# Patient Record
Sex: Female | Born: 1970 | Race: Black or African American | Hispanic: No | Marital: Single | Smoking: Never smoker
Health system: Southern US, Community
[De-identification: ages and names within clinical notes are randomized; demographics above are authoritative.]

## PROBLEM LIST (undated history)

## (undated) DIAGNOSIS — N649 Disorder of breast, unspecified: Secondary | ICD-10-CM

## (undated) DIAGNOSIS — I10 Essential (primary) hypertension: Secondary | ICD-10-CM

## (undated) DIAGNOSIS — E663 Overweight: Secondary | ICD-10-CM

## (undated) DIAGNOSIS — K219 Gastro-esophageal reflux disease without esophagitis: Secondary | ICD-10-CM

## (undated) DIAGNOSIS — T7840XA Allergy, unspecified, initial encounter: Secondary | ICD-10-CM

## (undated) DIAGNOSIS — J45909 Unspecified asthma, uncomplicated: Secondary | ICD-10-CM

## (undated) HISTORY — PX: TOTAL ABDOMINAL HYSTERECTOMY: SHX209

## (undated) HISTORY — DX: Unspecified asthma, uncomplicated: J45.909

## (undated) HISTORY — DX: Gastro-esophageal reflux disease without esophagitis: K21.9

## (undated) HISTORY — DX: Disorder of breast, unspecified: N64.9

## (undated) HISTORY — PX: INNER EAR SURGERY: SHX679

## (undated) HISTORY — PX: FRACTURE SURGERY: SHX138

## (undated) HISTORY — PX: ELBOW FRACTURE SURGERY: SHX616

## (undated) HISTORY — DX: Allergy, unspecified, initial encounter: T78.40XA

## (undated) HISTORY — DX: Overweight: E66.3

## (undated) HISTORY — DX: Essential (primary) hypertension: I10

---

## 1998-11-12 ENCOUNTER — Ambulatory Visit (HOSPITAL_COMMUNITY): Admission: RE | Admit: 1998-11-12 | Discharge: 1998-11-12 | Payer: Self-pay | Admitting: Internal Medicine

## 1998-11-12 ENCOUNTER — Encounter: Payer: Self-pay | Admitting: Internal Medicine

## 1999-01-20 ENCOUNTER — Inpatient Hospital Stay (HOSPITAL_COMMUNITY): Admission: AD | Admit: 1999-01-20 | Discharge: 1999-01-20 | Payer: Self-pay | Admitting: Obstetrics and Gynecology

## 1999-04-07 ENCOUNTER — Other Ambulatory Visit: Admission: RE | Admit: 1999-04-07 | Discharge: 1999-04-07 | Payer: Self-pay | Admitting: Obstetrics & Gynecology

## 1999-04-09 ENCOUNTER — Encounter: Admission: RE | Admit: 1999-04-09 | Discharge: 1999-04-23 | Payer: Self-pay | Admitting: Internal Medicine

## 1999-12-16 ENCOUNTER — Ambulatory Visit: Admission: RE | Admit: 1999-12-16 | Discharge: 1999-12-16 | Payer: Self-pay | Admitting: Internal Medicine

## 2000-04-07 ENCOUNTER — Other Ambulatory Visit: Admission: RE | Admit: 2000-04-07 | Discharge: 2000-04-07 | Payer: Self-pay | Admitting: Obstetrics and Gynecology

## 2001-03-03 ENCOUNTER — Ambulatory Visit (HOSPITAL_BASED_OUTPATIENT_CLINIC_OR_DEPARTMENT_OTHER): Admission: RE | Admit: 2001-03-03 | Discharge: 2001-03-03 | Payer: Self-pay | Admitting: Otolaryngology

## 2001-09-28 ENCOUNTER — Other Ambulatory Visit: Admission: RE | Admit: 2001-09-28 | Discharge: 2001-09-28 | Payer: Self-pay | Admitting: Obstetrics and Gynecology

## 2002-10-16 ENCOUNTER — Other Ambulatory Visit: Admission: RE | Admit: 2002-10-16 | Discharge: 2002-10-16 | Payer: Self-pay | Admitting: Obstetrics and Gynecology

## 2003-10-14 ENCOUNTER — Emergency Department (HOSPITAL_COMMUNITY): Admission: EM | Admit: 2003-10-14 | Discharge: 2003-10-14 | Payer: Self-pay | Admitting: Family Medicine

## 2004-01-07 ENCOUNTER — Other Ambulatory Visit: Admission: RE | Admit: 2004-01-07 | Discharge: 2004-01-07 | Payer: Self-pay | Admitting: Obstetrics and Gynecology

## 2004-02-14 ENCOUNTER — Ambulatory Visit: Payer: Self-pay | Admitting: Internal Medicine

## 2004-09-28 ENCOUNTER — Other Ambulatory Visit: Admission: RE | Admit: 2004-09-28 | Discharge: 2004-09-28 | Payer: Self-pay | Admitting: Obstetrics and Gynecology

## 2005-02-16 ENCOUNTER — Inpatient Hospital Stay (HOSPITAL_COMMUNITY): Admission: AD | Admit: 2005-02-16 | Discharge: 2005-02-17 | Payer: Self-pay | Admitting: Obstetrics and Gynecology

## 2005-04-26 ENCOUNTER — Ambulatory Visit: Payer: Self-pay | Admitting: Internal Medicine

## 2005-11-03 ENCOUNTER — Encounter: Admission: RE | Admit: 2005-11-03 | Discharge: 2005-11-03 | Payer: Self-pay | Admitting: Orthopedic Surgery

## 2005-11-11 ENCOUNTER — Ambulatory Visit (HOSPITAL_COMMUNITY): Admission: RE | Admit: 2005-11-11 | Discharge: 2005-11-12 | Payer: Self-pay | Admitting: Orthopedic Surgery

## 2006-02-16 ENCOUNTER — Encounter: Admission: RE | Admit: 2006-02-16 | Discharge: 2006-03-09 | Payer: Self-pay | Admitting: Internal Medicine

## 2007-02-24 ENCOUNTER — Ambulatory Visit: Payer: Self-pay | Admitting: Internal Medicine

## 2007-07-20 ENCOUNTER — Encounter: Admission: RE | Admit: 2007-07-20 | Discharge: 2007-07-20 | Payer: Self-pay | Admitting: Obstetrics and Gynecology

## 2008-09-16 ENCOUNTER — Encounter: Payer: Self-pay | Admitting: Internal Medicine

## 2008-10-23 ENCOUNTER — Telehealth: Payer: Self-pay | Admitting: Internal Medicine

## 2009-01-15 ENCOUNTER — Ambulatory Visit: Payer: Self-pay | Admitting: Family Medicine

## 2009-01-18 ENCOUNTER — Telehealth: Payer: Self-pay | Admitting: Family Medicine

## 2009-01-29 ENCOUNTER — Encounter: Payer: Self-pay | Admitting: Internal Medicine

## 2009-01-29 DIAGNOSIS — J45909 Unspecified asthma, uncomplicated: Secondary | ICD-10-CM

## 2009-01-29 HISTORY — DX: Unspecified asthma, uncomplicated: J45.909

## 2009-02-04 ENCOUNTER — Encounter: Payer: Self-pay | Admitting: Internal Medicine

## 2009-02-04 ENCOUNTER — Ambulatory Visit: Admission: RE | Admit: 2009-02-04 | Discharge: 2009-02-04 | Payer: Self-pay | Admitting: Internal Medicine

## 2009-02-27 ENCOUNTER — Ambulatory Visit: Payer: Self-pay | Admitting: Family Medicine

## 2009-02-27 ENCOUNTER — Encounter: Payer: Self-pay | Admitting: Internal Medicine

## 2009-02-28 LAB — CONVERTED CEMR LAB
ALT: 15 units/L (ref 0–35)
AST: 22 units/L (ref 0–37)
BUN: 5 mg/dL — ABNORMAL LOW (ref 6–23)
Basophils Relative: 0.9 % (ref 0.0–3.0)
CO2: 26 meq/L (ref 19–32)
Eosinophils Relative: 1.3 % (ref 0.0–5.0)
GFR calc non Af Amer: 90.79 mL/min (ref >60)
Glucose, Bld: 79 mg/dL (ref 70–99)
HCT: 40.9 % (ref 36.0–46.0)
HDL: 56 mg/dL (ref >39.00)
Hemoglobin: 13.6 g/dL (ref 12.0–15.0)
Lymphs Abs: 2.2 10*3/uL (ref 0.7–4.0)
MCV: 92.4 fL (ref 78.0–100.0)
Monocytes Absolute: 0.5 10*3/uL (ref 0.1–1.0)
Monocytes Relative: 8.4 % (ref 3.0–12.0)
Neutro Abs: 3.6 10*3/uL (ref 1.4–7.7)
Platelets: 283 10*3/uL (ref 150.0–400.0)
RBC: 4.43 M/uL (ref 3.87–5.11)
TSH: 0.98 microintl units/mL (ref 0.35–5.50)
Total Bilirubin: 1.1 mg/dL (ref 0.3–1.2)
Total Protein: 7.1 g/dL (ref 6.0–8.3)
Triglycerides: 59 mg/dL (ref 0.0–149.0)
WBC: 6.5 10*3/uL (ref 4.5–10.5)

## 2009-06-30 ENCOUNTER — Telehealth: Payer: Self-pay | Admitting: Internal Medicine

## 2009-07-02 ENCOUNTER — Ambulatory Visit: Payer: Self-pay | Admitting: Family Medicine

## 2009-07-02 LAB — CONVERTED CEMR LAB
Beta hcg, urine, semiquantitative: NEGATIVE
Glucose, Urine, Semiquant: NEGATIVE
Nitrite: NEGATIVE
Specific Gravity, Urine: 1.015
WBC Urine, dipstick: NEGATIVE

## 2009-08-01 ENCOUNTER — Telehealth: Payer: Self-pay | Admitting: Family Medicine

## 2009-09-29 ENCOUNTER — Ambulatory Visit: Payer: Self-pay | Admitting: Internal Medicine

## 2009-09-29 LAB — CONVERTED CEMR LAB
ALT: 14 units/L (ref 0–35)
AST: 18 units/L (ref 0–37)
Albumin: 3.5 g/dL (ref 3.5–5.2)
Alkaline Phosphatase: 51 units/L (ref 39–117)
Basophils Relative: 0.8 % (ref 0.0–3.0)
Calcium: 9 mg/dL (ref 8.4–10.5)
Eosinophils Relative: 5.4 % — ABNORMAL HIGH (ref 0.0–5.0)
GFR calc non Af Amer: 94.11 mL/min (ref >60)
HCT: 40.2 % (ref 36.0–46.0)
HDL: 46.4 mg/dL (ref >39.00)
Hemoglobin: 13.9 g/dL (ref 12.0–15.0)
Lymphocytes Relative: 44.2 % (ref 12.0–46.0)
Monocytes Relative: 8 % (ref 3.0–12.0)
Neutro Abs: 2.2 10*3/uL (ref 1.4–7.7)
Potassium: 4.1 meq/L (ref 3.5–5.1)
RBC: 4.43 M/uL (ref 3.87–5.11)
Sodium: 141 meq/L (ref 135–145)
Total CHOL/HDL Ratio: 3
Total Protein: 6.8 g/dL (ref 6.0–8.3)
VLDL: 9.8 mg/dL (ref 0.0–40.0)

## 2009-10-02 ENCOUNTER — Ambulatory Visit: Payer: Self-pay | Admitting: Internal Medicine

## 2009-10-02 DIAGNOSIS — R002 Palpitations: Secondary | ICD-10-CM | POA: Insufficient documentation

## 2009-10-07 ENCOUNTER — Telehealth: Payer: Self-pay | Admitting: *Deleted

## 2009-11-14 ENCOUNTER — Telehealth (INDEPENDENT_AMBULATORY_CARE_PROVIDER_SITE_OTHER): Payer: Self-pay | Admitting: *Deleted

## 2009-12-25 ENCOUNTER — Telehealth: Payer: Self-pay | Admitting: Internal Medicine

## 2009-12-30 ENCOUNTER — Inpatient Hospital Stay (HOSPITAL_COMMUNITY): Admission: RE | Admit: 2009-12-30 | Discharge: 2010-01-01 | Payer: Self-pay | Admitting: Obstetrics and Gynecology

## 2009-12-30 ENCOUNTER — Encounter (INDEPENDENT_AMBULATORY_CARE_PROVIDER_SITE_OTHER): Payer: Self-pay | Admitting: Obstetrics and Gynecology

## 2010-03-08 LAB — CONVERTED CEMR LAB
CK-MB: 1 ng/mL
Total CK: 95 U/L

## 2010-03-12 NOTE — Progress Notes (Signed)
Summary: call for meds  Phone Note Call from Patient   Caller: Patient Summary of Call: pt recently completed Levaquin course for URI and now having yeast sxs.  would like Diflucan called in. Initial call taken by: Neena Rhymes MD,  August 01, 2009 9:58 AM    New/Updated Medications: DIFLUCAN 150 MG TABS (FLUCONAZOLE) once daily.  may repeat in 3 days if sxs persist Prescriptions: DIFLUCAN 150 MG TABS (FLUCONAZOLE) once daily.  may repeat in 3 days if sxs persist  #2 x 0   Entered and Authorized by:   Neena Rhymes MD   Signed by:   Neena Rhymes MD on 08/01/2009   Method used:   Electronically to        [facility]* (retail)       [address]       [address] Shipping/mailing       [place], [place]  [address]       Ph: [phone]       Fax: [phone]   RxID:   [id number]

## 2010-03-12 NOTE — Progress Notes (Signed)
Summary: UTI  Phone Note Call from Patient Call back at [phone]   Caller: Patient Summary of Call: Has UTI, would like to have a rx called in to [facility] on [address]. Initial call taken by: Trixie Dredge,  Jun 30, 2009 8:43 AM  Follow-up for Phone Call        Cipro 250 two times a day for 3 days Follow-up by: Birdie Sons MD,  Jun 30, 2009 12:40 PM    New/Updated Medications: CIPRO 250 MG TABS (CIPROFLOXACIN HCL) one by mouth two times a day x 3 days Prescriptions: CIPRO 250 MG TABS (CIPROFLOXACIN HCL) one by mouth two times a day x 3 days  #6 x 0   Entered by:   Lynann Beaver CMA   Authorized by:   Birdie Sons MD   Signed by:   Lynann Beaver CMA on 06/30/2009   Method used:   Electronically to        [facility]  [phone]* (retail)       [address]       [place], [place]  [address]       Ph: [phone] or [phone]       Fax: [phone]   RxID:   [id number]

## 2010-03-12 NOTE — Assessment & Plan Note (Signed)
Summary: ACUTE/CDJ   Vital Signs:  Patient profile:   40 year old female Weight:      200 pounds Temp:     98.4 degrees F oral BP sitting:   130 / 80  (left arm)  Vitals Entered By: Doristine Devoid (Jul 02, 2009 3:16 PM) CC: sx started mon. vomiting and nausea some fever and back pain along w/ sinus congestion and sore throat   History of Present Illness: 40 yo woman here today for back pain, N/V.  sxs started abruptly Monday at 3 AM w/ back pain, urinary frequency.  had dry heaves in the shower.  + fever, HA.  HA has resolved.  back pain is lumbar- has been doing boot camp and zumba classes.  back pain improves w/ heat and massage.  + nasal congestion- thick, blood tinged.  no facial pain.  + sore throat, somewhat improved w/ ibuprofen.  no hx of kidney stones.  Allergies (verified): No Known Drug Allergies  Review of Systems      See HPI  Physical Exam  General:  obviously uncomfortable Head:  no TTP over frontal or maxillary sinuses Eyes:  some injxn or inflammation Ears:  External ear exam shows no significant lesions or deformities.  Otoscopic examination reveals clear canals, tympanic membranes are intact bilaterally without bulging, retraction, inflammation or discharge. Hearing is grossly normal bilaterally. Nose:  + congestion Mouth:  + posterior pharyngeal erythema.  no tonsilar edema/exudates Neck:  some LAD Lungs:  Normal respiratory effort, chest expands symmetrically. Lungs are clear to auscultation, no crackles or wheezes. Heart:  Normal rate and regular rhythm. S1 and S2 normal without gallop, murmur, click, rub or other extra sounds. Abdomen:  soft, NT/ND, no suprapubic or CVA tenderness.  + palpable fibroid Msk:  + lumbar pain w/ palpation, good flexion and extension, (-) SLR Pulses:  +2 DP/PT Extremities:  no C/C/E Neurologic:  alert & oriented X3, cranial nerves II-XII intact, strength normal in all extremities, sensation intact to light touch, gait normal, and  DTRs symmetrical and normal.     Impression & Recommendations:  Problem # 1:  URI (ICD-465.9) Assessment New bloody sinuses possibly due to infxn but lack of tenderness is unusual.  + sore throat.  no ear pain, cough.  start Levaquin for likely sinusitis- would also cover possible bronchitis/UTI.  reviewed supportive care and red flags that should prompt return. Pt expresses understanding and is in agreement w/ this plan.  Problem # 2:  UTI (ICD-599.0) Assessment: Unchanged UA suspicious for infxn.  start Levaquin and send for cx. The following medications were removed from the medication list:    Cipro 250 Mg Tabs (Ciprofloxacin hcl) ..... One by mouth two times a day x 3 days Her updated medication list for this problem includes:    Levaquin 500 Mg Tabs (Levofloxacin) .Marland Kitchen... 1 tab by mouth daily x10 days  Orders: T-Culture, Urine ([id number])  Problem # 3:  BACK PAIN (ICD-724.5) Assessment: New ? relation to current illness vs lumbar strain from boot camp or zumba.  start ibuprofen alternating w/ vicodin.  reviewed supportive care and red flags that should prompt return.  Pt expresses understanding and is in agreement w/ this plan. Her updated medication list for this problem includes:    Vicodin 5-500 Mg Tabs (Hydrocodone-acetaminophen) .Marland Kitchen... 1-2 tabs by mouth q6 as needed for severe pain  Orders: T-Culture, Urine ([id number])  Complete Medication List: 1)  Loestrin 24 Fe 1-20 Mg-mcg Tabs (Norethin ace-eth estrad-fe) 2)  Adipex-p  37.5 Mg Caps (Phentermine hcl) .Marland Kitchen.. 1 by mouth once daily 3)  Valacyclovir Hcl 500 Mg Tabs (Valacyclovir hcl) .Marland Kitchen.. 1 three times a day 4)  Zovirax 5 % Oint (Acyclovir) .... Marland Kitchenapply 5 times a day as directed 5)  Levaquin 500 Mg Tabs (Levofloxacin) .Marland Kitchen.. 1 tab by mouth daily x10 days 6)  Vicodin 5-500 Mg Tabs (Hydrocodone-acetaminophen) .Marland Kitchen.. 1-2 tabs by mouth q6 as needed for severe pain  Patient Instructions: 1)  We will wait the results of your urine  culture 2)  In the mean time, start the Levaquin as directed- this will cover sinus infection, pneumonia/bronchitis (although you don't seem to have this), and UTI 3)  Take the Vicodin as needed for your back pain 4)  Ibuprofen 800mg  every 8 hrs as needed for pain 5)  Use a heating pad for pain relief 6)  If you back pain worsens, you have increased nausea/vomiting, or other concerns- call or go to UC/ER 7)  We may need to get a CT scan depending on your urine culture and back pain 8)  Hang in there! Prescriptions: VICODIN 5-500 MG TABS (HYDROCODONE-ACETAMINOPHEN) 1-2 tabs by mouth Q6 as needed for severe pain  #30 x 0   Entered and Authorized by:   Neena Rhymes MD   Signed by:   Neena Rhymes MD on 07/02/2009   Method used:   Printed then faxed to ...       [facility]* (retail)       [address]       [address] Shipping/mailing       [place], [place]  [address]       Ph: [phone]       Fax: [phone]   RxID:   [id number] LEVAQUIN 500 MG TABS (LEVOFLOXACIN) 1 tab by mouth daily x10 days  #10 x 0   Entered and Authorized by:   Neena Rhymes MD   Signed by:   Neena Rhymes MD on 07/02/2009   Method used:   Printed then faxed to ...       [facility]* (retail)       [address]       [address] Shipping/mail[address]       [place], [place]  [address]       Ph: [phone]       Fax: [phone]   RxID:   [id number]   Laboratory Results   Urine Tests    Routine Urinalysis   Glucose: negative   (Normal Range: Negative) Bilirubin: negative   (Normal Range: Negative) Ketone: negative   (Normal Range: Negative) Spec. Gravity: 1.015   (Normal Range: 1.003-1.035) Blood: small   (Normal Range: Negative) pH: 6.0   (Normal Range: 5.0-8.0) Protein: negative   (Normal Range: Negative) Urobilinogen: 0.2   (Normal Range: 0-1) Nitrite: negative   (Normal Range: Negative) Leukocyte Esterace: negative   (Normal  Range: Negative)    Urine HCG: negative   Other Tests  Rapid Strep: negative

## 2010-03-12 NOTE — Progress Notes (Signed)
Summary: new rx  Phone Note Refill Request   Refills Requested: Medication #1:  ADDERALL XR 20 MG XR24H-CAP one by mouth daily. please call pt when ready for pick up ,pt is requesting med tomorrow please.  Initial call taken by: Drue Stager,  December 25, 2009 5:24 PM  Follow-up for Phone Call        rx up front ready for p/u, pt aware Follow-up by: Alfred Levins, CMA,  December 26, 2009 9:14 AM    Prescriptions: ADDERALL XR 20 MG XR24H-CAP (AMPHETAMINE-DEXTROAMPHETAMINE) one by mouth daily  #30 x 0   Entered by:   Alfred Levins, CMA   Authorized by:   Birdie Sons MD   Signed by:   Alfred Levins, CMA on 12/26/2009   Method used:   Print then Give to Patient   RxID:   [id number] ADDERALL XR 20 MG XR24H-CAP (AMPHETAMINE-DEXTROAMPHETAMINE) one by mouth daily  #30 x 0   Entered by:   Alfred Levins, CMA   Authorized by:   Birdie Sons MD   Signed by:   Alfred Levins, CMA on 12/26/2009   Method used:   Print then Give to Patient   RxID:   [id number] ADDERALL XR 20 MG XR24H-CAP (AMPHETAMINE-DEXTROAMPHETAMINE) one by mouth daily  #30 x 0   Entered by:   Alfred Levins, CMA   Authorized by:   Birdie Sons MD   Signed by:   Alfred Levins, CMA on 12/26/2009   Method used:   Print then Give to Patient   RxID:   [id number]

## 2010-03-12 NOTE — Assessment & Plan Note (Signed)
Summary: roa   Vital Signs:  Patient profile:   40 year old female Height:      68 inches Weight:      200.50 pounds BMI:     30.60 Temp:     98.2 degrees F oral Pulse rate:   92 / minute Pulse rhythm:   regular BP sitting:   130 / 82  (left arm) Cuff size:   regular  Vitals Entered By: Army Fossa CMA (February 27, 2009 10:18 AM) CC: discuss phentermine   History of Present Illness:  Pt here to discuss weight loss.  She has used phenteramine before and lost 30 lbs.  Pt has been exercising at least 3x a week.  No complaints.    Preventive Screening-Counseling & Management  Alcohol-Tobacco     Alcohol drinks/day: <1  Caffeine-Diet-Exercise     Caffeine use/day: 5-6     Diet Comments: per trainer     Does Patient Exercise: yes     Type of exercise: trainer     Exercise (avg: min/session): 30-60     Times/week: 4  Current Medications (verified): 1)  Loestrin 24 Fe 1-20 Mg-Mcg Tabs (Norethin Ace-Eth Estrad-Fe) 2)  Adipex-P 37.5 Mg Caps (Phentermine Hcl) .Marland Kitchen.. 1 By Mouth Once Daily  Allergies (verified): No Known Drug Allergies  Past History:  Past medical, surgical, family and social histories (including risk factors) reviewed for relevance to current acute and chronic problems.  Past Medical History: Reviewed history from 02/24/2007 and no changes required. Unremarkable  Past Surgical History: Reviewed history from 02/24/2007 and no changes required. elbow fracture--roller skating---required surgery ear surgery  Family History: Reviewed history from 02/24/2007 and no changes required. mother-htn father-CHF, kidney failure  Social History: Reviewed history from 02/24/2007 and no changes required. Single Never Smoked Regular exercise-no Does Patient Exercise:  yes Caffeine use/day:  5-6  Review of Systems      See HPI  Physical Exam  General:  Well-developed,well-nourished,in no acute distress; alert,appropriate and cooperative throughout  examination Lungs:  Normal respiratory effort, chest expands symmetrically. Lungs are clear to auscultation, no crackles or wheezes. Heart:  Normal rate and regular rhythm. S1 and S2 normal without gallop, murmur, click, rub or other extra sounds. Extremities:  No clubbing, cyanosis, edema, or deformity noted with normal full range of motion of all joints.   Cervical Nodes:  No lymphadenopathy noted Psych:  Cognition and judgment appear intact. Alert and cooperative with normal attention span and concentration. No apparent delusions, illusions, hallucinations   Impression & Recommendations:  Problem # 1:  MORBID OBESITY (ICD-278.01)  adipex 37.5  1 by mouth once daily  rto 1 month d/w pt diet and exercise Orders: Venipuncture ([address]) TLB-Lipid Panel (80061-LIPID) TLB-BMP (Basic Metabolic Panel-BMET) (80048-METABOL) TLB-CBC Platelet - w/Differential (85025-CBCD) TLB-Hepatic/Liver Function Pnl (80076-HEPATIC) TLB-TSH (Thyroid Stimulating Hormone) (84443-TSH) No Charge Patient Arrived (NCPA0) (NCPA0)  Ht: 68 (02/27/2009)   Wt: 200.50 (02/27/2009)   BMI: 30.60 (02/27/2009)  Complete Medication List: 1)  Loestrin 24 Fe 1-20 Mg-mcg Tabs (Norethin ace-eth estrad-fe) 2)  Adipex-p 37.5 Mg Caps (Phentermine hcl) .Marland Kitchen.. 1 by mouth once daily Prescriptions: ADIPEX-P 37.5 MG CAPS (PHENTERMINE HCL) 1 by mouth once daily  #30 x 0   Entered and Authorized by:   Loreen Freud DO   Signed by:   Loreen Freud DO on 02/27/2009   Method used:   Print then Give to Patient   RxID:   [id number]

## 2010-03-12 NOTE — Progress Notes (Signed)
Summary: question  Phone Note Call from Patient Call back at Work Phone [phone]   Caller: Patient Call For: Birdie Sons MD Summary of Call: Asking site for ADD link. Initial call taken by: Gladis Riffle, RN,  October 07, 2009 4:10 PM  Follow-up for Phone Call        ill get it to you Follow-up by: Birdie Sons MD,  October 07, 2009 9:09 PM  Additional Follow-up for Phone Call Additional follow up Details #1::        patient is aware Additional Follow-up by: Kern Reap CMA Duncan Dull),  October 23, 2009 11:10 AM

## 2010-03-12 NOTE — Assessment & Plan Note (Signed)
Summary: CPX/NO PAP/NJR   Vital Signs:  Patient profile:   40 year old female Menstrual status:  on pill so none LMP:     2009 Height:      68 inches Weight:      208 pounds BMI:     31.74 Pulse rate:   80 / minute Pulse rhythm:   regular Resp:     12 per minute BP sitting:   132 / 90  (left arm) Cuff size:   regular  Vitals Entered By: Gladis Riffle, RN (October 02, 2009 10:02 AM) CC: cpx, has gyn, labs done--c/o stress at work Is Patient Diabetic? No Comments states heart races at night so cannot sleep at times LMP (date): 2009     Menstrual Status on pill so none Enter LMP: 2009   Primary Care Provider:  Birdie Sons MD  CC:  cpx, has gyn, and labs done--c/o stress at work.  History of Present Illness: CPX  palpitations---on and off for several months---nocturnal sxs  concerned with not being focused---different job . some trouble with not getting job done.  fibroids---scheduled for excision due to size.   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 1 Three Times A Day 3)  Zovirax 5 % Oint (Acyclovir) .... Marland Kitchenapply 5 Times A Day As Directed  Allergies (verified): No Known Drug Allergies  Past History:  Past Medical History: Last updated: 02/24/2007 Unremarkable  Past Surgical History: Last updated: 02/24/2007 elbow fracture--roller skating---required surgery ear surgery  Family History: Last updated: 02/24/2007 mother-htn father-CHF, kidney failure  Social History: Last updated: 02/24/2007 Single Never Smoked Regular exercise-no  Risk Factors: Alcohol Use: <1  (10/02/2009) Caffeine Use: 5-6 (02/27/2009) Diet: per trainer (02/27/2009) Exercise: yes (02/27/2009)  Risk Factors: Smoking Status: never (10/02/2009)  Physical Exam  General:  alert and well-developed.   Head:  normocephalic and atraumatic.   Eyes:  pupils equal and pupils round.   Ears:  R ear normal and L ear normal.   Neck:  supple and full ROM.   Lungs:  normal respiratory effort and no intercostal retractions.   Heart:  normal rate and regular rhythm.   Abdomen:  soft and non-tender.   Msk:  + lumbar pain w/ palpation, good flexion and extension, (-) SLR Neurologic:  cranial nerves II-XII intact and gait normal.     Impression & Recommendations:  Problem # 1:  PREVENTIVE HEALTH CARE (ICD-V70.0) health maint UTD  Problem # 2:  PALPITATIONS (ICD-785.1) EKG  no further evaluation necessary. ? ADD  she will take a preliminary ADD evaluation.  Complete Medication List: 1)  Loestrin 24 Fe 1-20 Mg-mcg Tabs (Norethin ace-eth estrad-fe) 2)  Valacyclovir Hcl 500 Mg Tabs (Valacyclovir hcl) .Marland Kitchen.. 1 three times a day 3)  Zovirax 5 % Oint (Acyclovir) .... Marland Kitchenapply 5 times a day as directed  Other Orders: Tdap => 59yrs IM ([address]) Admin 1st Vaccine ([address])   Immunizations Administered:  Tetanus Vaccine:    Vaccine Type: Tdap    Site: left deltoid    Mfr: [employer]oSmithK[employer]    Dose: 0.5 ml    Route: IM    Given  by: Gladis Riffle, RN    Exp. Date: 11/28/2011    Lot #: [id number]    VIS given: 12/27/06 version given October 02, 2009.

## 2010-03-12 NOTE — Progress Notes (Signed)
Summary: A.D.D.  [facility] [place]  Phone Note Call from Patient Call back at [phone]   Caller: Patient Call For: Birdie Sons MD Summary of Call: pt is having trouble focusing needs something  also needs website for ADD link Initial call taken by: Heron Sabins,  November 14, 2009 3:50 PM  Follow-up for Phone Call        [email or web address]  start adderall xr 20 mg by mouth once daily #30/0 Follow-up by: Birdie Sons MD,  November 17, 2009 5:12 PM    New/Updated Medications: ADDERALL XR 20 MG XR24H-CAP (AMPHETAMINE-DEXTROAMPHETAMINE) one by mouth daily Prescriptions: ADDERALL XR 20 MG XR24H-CAP (AMPHETAMINE-DEXTROAMPHETAMINE) one by mouth daily  #30 x 0   Entered by:   Lynann Beaver CMA   Authorized by:   Birdie Sons MD   Signed by:   Lynann Beaver CMA on 11/18/2009   Method used:   Print then Give to Patient   RxID:   [id number]  Notified pt.  Appended Document: A.D.D.  LINK Pt notified rx ready for pick up and left at front desk.

## 2010-04-22 LAB — CBC
HCT: 40.3 % (ref 36.0–46.0)
MCH: 31 pg (ref 26.0–34.0)
MCH: 31.4 pg (ref 26.0–34.0)
MCHC: 34.1 g/dL (ref 30.0–36.0)
MCV: 90.9 fL (ref 78.0–100.0)
MCV: 91.7 fL (ref 78.0–100.0)
Platelets: 241 10*3/uL (ref 150–400)
RBC: 4.11 MIL/uL (ref 3.87–5.11)
RDW: 12.3 % (ref 11.5–15.5)
WBC: 6.7 10*3/uL (ref 4.0–10.5)

## 2010-05-12 ENCOUNTER — Telehealth: Payer: Self-pay | Admitting: *Deleted

## 2010-05-12 NOTE — Telephone Encounter (Signed)
Need rx refill of

## 2010-05-13 NOTE — Telephone Encounter (Signed)
Disregard pt will come in for appt to f/u on medication and shoulder pain

## 2010-05-14 ENCOUNTER — Encounter: Payer: Self-pay | Admitting: Internal Medicine

## 2010-05-18 ENCOUNTER — Ambulatory Visit (INDEPENDENT_AMBULATORY_CARE_PROVIDER_SITE_OTHER): Payer: 59 | Admitting: Internal Medicine

## 2010-05-18 ENCOUNTER — Encounter: Payer: Self-pay | Admitting: Internal Medicine

## 2010-05-18 VITALS — BP 132/90 | HR 92 | Temp 98.7°F | Ht 68.0 in | Wt 212.0 lb

## 2010-05-18 DIAGNOSIS — M25512 Pain in left shoulder: Secondary | ICD-10-CM

## 2010-05-18 DIAGNOSIS — M25519 Pain in unspecified shoulder: Secondary | ICD-10-CM

## 2010-05-18 DIAGNOSIS — F909 Attention-deficit hyperactivity disorder, unspecified type: Secondary | ICD-10-CM

## 2010-05-18 MED ORDER — AMPHETAMINE-DEXTROAMPHET ER 20 MG PO CP24: 20.0000 mg | ORAL_CAPSULE | ORAL | Status: DC

## 2010-05-18 NOTE — Assessment & Plan Note (Signed)
Patient with subacute left shoulder pain. I think this is mostly rotator cuff related. She may have an impingement syndrome. I think she'll do best with physical therapy.

## 2010-05-18 NOTE — Progress Notes (Signed)
  Subjective:    Patient ID: Candice Parker, female    DOB: [birth year], 40 y.o.   MRN: [id number]  HPI 2 month hx of intermittent left shoulder pain. Can be severe. It happens with certain activities but she cannot reproduce the discomfort. No injury, no swelling.  Pain does not bother her with normal work activities  She reports several years ago she had some shoulder pain after moving a television.  Past Medical History  Diagnosis Date  . ASTHMA, INTRINSIC 01/29/2009  . Palpitations 10/02/2009   Past Surgical History  Procedure Date  . Elbow fracture surgery   . Inner ear surgery   . Total abdominal hysterectomy     reports that she has never smoked. She does not have any smokeless tobacco history on file. She reports that she drinks alcohol. She reports that she does not use illicit drugs. family history includes Heart failure in her father; Hypertension in her mother; and Kidney disease in her father. No Known Allergies Review of Systems  patient denies chest pain, shortness of breath, orthopnea. Denies lower extremity edema, abdominal pain, change in appetite, change in bowel movements. Patient denies rashes, musculoskeletal complaints. No other specific complaints in a complete review of systems.      Objective:   Physical Exam Well-developed female no acute distress. Neck supple. She has full range of motion of the right shoulder. Full flexion and extension of the left shoulder. Full abduction. Decreased internal rotation secondary to pain. No pain to palpation no erythema over the left shoulder.       Assessment & Plan:

## 2010-05-19 ENCOUNTER — Encounter: Payer: Self-pay | Admitting: Internal Medicine

## 2010-05-25 ENCOUNTER — Ambulatory Visit: Payer: 59 | Attending: Internal Medicine

## 2010-05-25 DIAGNOSIS — M25519 Pain in unspecified shoulder: Secondary | ICD-10-CM | POA: Insufficient documentation

## 2010-05-25 DIAGNOSIS — IMO0001 Reserved for inherently not codable concepts without codable children: Secondary | ICD-10-CM | POA: Insufficient documentation

## 2010-05-25 DIAGNOSIS — M25619 Stiffness of unspecified shoulder, not elsewhere classified: Secondary | ICD-10-CM | POA: Insufficient documentation

## 2010-05-29 ENCOUNTER — Ambulatory Visit: Payer: 59

## 2010-06-01 ENCOUNTER — Ambulatory Visit: Payer: 59

## 2010-06-01 ENCOUNTER — Telehealth: Payer: Self-pay | Admitting: Family Medicine

## 2010-06-01 MED ORDER — METRONIDAZOLE 500 MG PO TABS: 500.0000 mg | ORAL_TABLET | Freq: Two times a day (BID) | ORAL | Status: AC

## 2010-06-01 NOTE — Telephone Encounter (Signed)
Pt called indicating she thought she had a vaginal bacterial infxn.  Unable to get in touch w/ GYN.  She's not sure if she can take OTC meds to treat it.  Has hx of similar.  Will call in Flagyl for pt.

## 2010-06-03 ENCOUNTER — Ambulatory Visit: Payer: 59

## 2010-06-08 ENCOUNTER — Ambulatory Visit: Payer: 59

## 2010-06-10 ENCOUNTER — Ambulatory Visit: Payer: 59 | Attending: Internal Medicine

## 2010-06-10 DIAGNOSIS — IMO0001 Reserved for inherently not codable concepts without codable children: Secondary | ICD-10-CM | POA: Insufficient documentation

## 2010-06-10 DIAGNOSIS — M25619 Stiffness of unspecified shoulder, not elsewhere classified: Secondary | ICD-10-CM | POA: Insufficient documentation

## 2010-06-10 DIAGNOSIS — M25519 Pain in unspecified shoulder: Secondary | ICD-10-CM | POA: Insufficient documentation

## 2010-06-16 ENCOUNTER — Ambulatory Visit: Payer: 59 | Admitting: Physical Therapy

## 2010-06-26 NOTE — Op Note (Signed)
[place]  Patient:    Candice Parker, Candice Parker Visit Number: [id number] MRN: [id number]          Service Type: DSU Location: [facility] Attending Physician:  Susy Frizzle Dictated by:   Jeannett Senior Pollyann Kennedy, M.D. Proc. Date: 03/03/01 Admit Date:  03/03/2001 Discharge Date: 03/03/2001   CC:         Corwin Levins, M.D. [facility]   Operative Report  PREOPERATIVE DIAGNOSIS:  Left conductive hearing loss.  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  PROCEDURE:  Exploratory tympanotomy with ossiculoplasty, left side.  SURGEON:  Jefry H. Pollyann Kennedy, M.D.  ANESTHESIA:  General endotracheal.  COMPLICATIONS:  None.  ESTIMATED BLOOD LOSS:  Minimal.  REFERRING PHYSICIAN:  Corwin Levins, M.D.  FINDINGS:  Fibrotic mucosal tissue in the tympanic cavity with stapes footplate intact and mobile.  Stapes superstructure completely absent.  Long process of the incus absent as well.  The malleus and incus remnant were completely in discontinuity with the stapes footplate, but were mobile.  The patient tolerated the procedure well, was awakened, extubated, and transferred to recovery in stable condition.  INDICATIONS FOR PROCEDURE:  This is a 40 year old lady with an almost lifelong history of chronic left-sided hearing loss.  Risks, benefits, alternatives, and complications of the procedure were explained to the patient who seemed to understand and agreed to surgery.  DESCRIPTION OF PROCEDURE:  The patient was taken to the operating room and placed on the operating table in the supine position.  Following induction of general endotracheal anesthesia, the patient was positioned and draped in the standard fashion.  One percent Xylocaine with epinephrine was infiltrated into four quadrants of the external auditory canal.  Canal incisions were created with a round knife at 12 oclock and 7 oclock, and a tympanomeatal flap was developed and  brought anteriorly.  The fibrous annulus was dissected off of the annular bone and brought forward, dissecting through the middle ear mucosa.  The chorda tympani nerve was identified, was lying up against the scutum superiorly, and was dissected off of the tympanomeatal flap.  Bony curets were used to remove scutum and external canal bone. to further facilitate exposure into the tympanic cavity.  The round window nitch was partially covered with fibrotic mucosal tissue.  This was cleaned off gently with a curved pick.  Superior to that was the oval window nitch was completely covered with a thin fibrous band that was also taken down.  The stapes superstructure was noted to be absent.  The footplate was intact, and by palpation was mobile with a good round window reflex.  Palpation of the malleus lateral process was also mobile.  An incus stapes prosthesis was cut to size and shape and used for reconstruction of the ossicular chain.  A tragal perichondrial graft was harvested and laid down in the oval window to help secure the prosthesis.  The prosthesis was gently inserted into position after cutting down to size, and carefully was laid under the lateral process of the malleus.  The tubotympanum was packed with saline soaked Gelfoam.  One single piece of saline Gelfoam was then inserted just inferior to the prosthesis to help with stabilization.  There was a good round window reflex with palpation of the malleus at this point.  The tympanomeatal flap was returned to its normal position and saline soaked Gelfoam was packed on to the incision and into the ear canal.  A cotton ball was placed at  the external meatus with Bacitracin.  The patient was then awakened from anesthesia and transferred to recovery in stable condition. Dictated by:   Jeannett Senior Pollyann Kennedy, M.D. Attending Physician:  Susy Frizzle DD:  03/03/01 TD:  03/05/01 Job: [id number]

## 2010-06-26 NOTE — Op Note (Signed)
NAMEALLURE, GREASER               ACCOUNT NO.:  [id number]   MEDICAL RECORD NO.:  [id number]          PATIENT TYPE:  OIB   LOCATION:  5005                         FACILITY:  [facility]   PHYSICIAN:  Dionne Ano. Gramig III, M.D.DATE OF BIRTH:  Jan 26, 1971   DATE OF PROCEDURE:  DATE OF DISCHARGE:                                 OPERATIVE REPORT   PREOPERATIVE DIAGNOSIS:  Left elbow fracture dislocation with radial head  fracture and lateral ulnar collateral ligament tear.   POSTOPERATIVE DIAGNOSIS:  Left elbow fracture dislocation with radial head  fracture and lateral ulnar collateral ligament tear.   PROCEDURE:  1. Open reduction and internal fixation of radial head fracture.  2. Lateral ulnar collateral ligament repair left elbow.  3. Stretch radiography.   SURGEON:  Dionne Ano. Amanda Pea, M.D.   ASSISTANT:  Karie Chimera, P.A.-C.   COMPLICATIONS:  None.   TOURNIQUET TIME:  Less than an hour.   DRAINS:  None.   INDICATIONS FOR THE PROCEDURE:  Ms. Bazin is a 40 year old female who  presents with the above mentioned diagnosis.  I have counseled her in regard  to risks and benefits of surgery including risk of infection, bleeding,  anesthesia, damage to normal structures and failure of surgery to accomplish  its intended goals of relieving symptoms and restoring function.  With this  in mind she desires to proceed.  All questions have been encouraged and  answered preoperatively.   OPERATIVE PROCEDURE:  The patient was seen by myself in anesthesia and taken  to the operating suite and underwent a smooth induction of general  anesthesia.  Preoperative Ancef was given.  She was laid supine,  appropriately padded, prepped and draped in the usual sterile fashion about  the left upper extremity.  Betadine scrub and pain were used.  Collier Flowers was  placed about the arm.  Sterile drape was secured and the arm is elevated and  insufflated to 250 mmHg.  Following this the patient had a venous  tourniquet  and thus I increased the tourniquet pressure and reapplied it at 300.  I  then made a Kocher incision to the elbow.  An interval between the ECU and  the anconeus tendon was created.  The patient's fascia was identified and  the interval exposed.  This revealed the lateral ulnar collateral ligament  disruption as well as radial head fracture.  I then very meticulously  debrided the joint, irrigated copiously and lavaged the area.  There was no  capitellar defects.  The patient had displaced radial head fracture.  Preoperatively, I had discussed arthroplasty versus ORIF.  At the time of  surgery I was able to piece the pieces back together and place 2 mini  Accutrac screws for fracture fixation.  This was done with standard AO  technique to my satisfaction without difficulty.  Following placement of the  screws, I then checked this under multiple AP, lateral and oblique images  and stretched the elbow under live fluoro.  All went quite well and I was  pleased with these findings.  There was some comminution present, however,  the  screw fixation appeared to be adequate and there was no complicating  features.  Once this was done I then performed evaluation of the lateral and  the collateral ligament.  A 3.0 suture anchor was placed with exiting fiber  wire strands.  The patient had this imbricated through the lateral ulnar  collateral ligament and thus tied back down to the bone where it was avulsed  from.  The patient tolerated this well.  I then imbricated additionally with  #2 fiber wire and closed the interval with similar #2 fiber wire between the  ECU and anconeus.  Hemostasis was obtained with bipolar electrocautery.  Prior to final closure of the internal, copious irrigation was applied.  Once this was done, I then performed closure with 3-0 Vicryl followed by  subcuticular suture.  Sensorcaine with epinephrine and Steri-Strips were  applied.  The patient tolerated this  well.  She was placed in a long arm  splint in neutral position.  We will plan for ice, elevation, postoperative  antibiotics in the form of Ancef and return to the office in 10 days  postoperative for suture removal, x-rays and therapeutic endeavors to be  followed.  I will initiate early range of motion protocol given all issues.  I discussed with the patient these findings , the do's and don't's, etc.,  and all questions have been encouraged and answered.           ______________________________  Dionne Ano. Everlene Other, M.D.     Nash Mantis  D:  11/11/2005  T:  11/12/2005  Job:  [id number]

## 2010-07-10 ENCOUNTER — Encounter: Payer: 59 | Admitting: Physical Therapy

## 2010-07-17 ENCOUNTER — Other Ambulatory Visit: Payer: Self-pay | Admitting: Family Medicine

## 2010-07-17 ENCOUNTER — Ambulatory Visit (INDEPENDENT_AMBULATORY_CARE_PROVIDER_SITE_OTHER): Payer: 59 | Admitting: Family Medicine

## 2010-07-17 ENCOUNTER — Encounter: Payer: Self-pay | Admitting: Family Medicine

## 2010-07-17 VITALS — BP 162/120

## 2010-07-17 DIAGNOSIS — I10 Essential (primary) hypertension: Secondary | ICD-10-CM

## 2010-07-17 LAB — CBC WITH DIFFERENTIAL/PLATELET
Basophils Absolute: 0 10*3/uL (ref 0.0–0.1)
Basophils Relative: 1 % (ref 0–1)
Eosinophils Relative: 3 % (ref 0–5)
HCT: 40.1 % (ref 36.0–46.0)
MCHC: 34.4 g/dL (ref 30.0–36.0)
Monocytes Absolute: 0.6 10*3/uL (ref 0.1–1.0)
Neutro Abs: 4.2 10*3/uL (ref 1.7–7.7)
RDW: 12.8 % (ref 11.5–15.5)

## 2010-07-17 LAB — TSH: TSH: 0.9 u[IU]/mL (ref 0.350–4.500)

## 2010-07-17 LAB — BASIC METABOLIC PANEL
BUN: 10 mg/dL (ref 6–23)
Calcium: 9.3 mg/dL (ref 8.4–10.5)
Creat: 0.8 mg/dL (ref 0.50–1.10)

## 2010-07-17 MED ORDER — LISINOPRIL-HYDROCHLOROTHIAZIDE 10-12.5 MG PO TABS: 1.0000 | ORAL_TABLET | Freq: Every day | ORAL | Status: DC

## 2010-07-17 NOTE — Patient Instructions (Signed)
Recheck in 3-4 weeks (will repeat labs at that time) If you have worsening headaches, chest pain, shortness of breath, numbness, etc- please let me know or go to the ER Start the lisinopril HCTZ combo- 1 pill daily Let me know if you have any questions or concerns Hang in there!  We'll get this right!

## 2010-07-19 ENCOUNTER — Encounter: Payer: Self-pay | Admitting: Family Medicine

## 2010-07-19 NOTE — Assessment & Plan Note (Signed)
Pt's elevated BP makes her officially hypertensive.  Given amount of elevation will start meds and follow closely.  If pt's sxs of memory loss and headache don't improve w/ BP control will refer to neuro and get appropriate imaging studies.  Reviewed supportive care and red flags that should prompt return.  Pt expressed understanding and is in agreement w/ plan.

## 2010-07-19 NOTE — Progress Notes (Signed)
  Subjective:    Patient ID: Candice Parker, female    DOB: 09-03-1970, 40 y.o.   MRN: [id number]  HPI Elevated BP- pt was not feeling well at work and asked me to check BP.  It was very high.  Reports this has been an issue in the past but she has been ignoring it.  Admits to increased headaches, memory loss, had 1 episode of chest tightness but reports it was so brief she 'didn't have time to think about it'.  Work has been very stressful.  Denies SOB.  + fatigue.   Review of Systems For ROS see HPI     Objective:   Physical Exam  Constitutional: She is oriented to person, place, and time. She appears well-developed and well-nourished. No distress.  HENT:  Head: Normocephalic and atraumatic.  Eyes: Conjunctivae and EOM are normal. Pupils are equal, round, and reactive to light.  Neck: Normal range of motion. Neck supple. No thyromegaly present.  Cardiovascular: Normal rate, regular rhythm, normal heart sounds and intact distal pulses.   No murmur heard. Pulmonary/Chest: Effort normal and breath sounds normal. No respiratory distress.  Abdominal: Soft. She exhibits no distension. There is no tenderness.  Musculoskeletal: She exhibits no edema.  Lymphadenopathy:    She has no cervical adenopathy.  Neurological: She is alert and oriented to person, place, and time.  Skin: Skin is warm and dry.  Psychiatric: She has a normal mood and affect. Her behavior is normal.          Assessment & Plan:

## 2010-09-01 ENCOUNTER — Other Ambulatory Visit: Payer: Self-pay | Admitting: *Deleted

## 2010-09-01 DIAGNOSIS — F909 Attention-deficit hyperactivity disorder, unspecified type: Secondary | ICD-10-CM

## 2010-09-01 MED ORDER — AMPHETAMINE-DEXTROAMPHET ER 20 MG PO CP24: 20.0000 mg | ORAL_CAPSULE | ORAL | Status: DC

## 2010-12-10 ENCOUNTER — Encounter: Payer: Self-pay | Admitting: Family Medicine

## 2010-12-10 ENCOUNTER — Ambulatory Visit (INDEPENDENT_AMBULATORY_CARE_PROVIDER_SITE_OTHER): Payer: 59 | Admitting: Family Medicine

## 2010-12-10 DIAGNOSIS — E669 Obesity, unspecified: Secondary | ICD-10-CM | POA: Insufficient documentation

## 2010-12-10 DIAGNOSIS — K219 Gastro-esophageal reflux disease without esophagitis: Secondary | ICD-10-CM

## 2010-12-10 DIAGNOSIS — N760 Acute vaginitis: Secondary | ICD-10-CM

## 2010-12-10 DIAGNOSIS — E663 Overweight: Secondary | ICD-10-CM

## 2010-12-10 DIAGNOSIS — J45909 Unspecified asthma, uncomplicated: Secondary | ICD-10-CM

## 2010-12-10 DIAGNOSIS — J019 Acute sinusitis, unspecified: Secondary | ICD-10-CM | POA: Insufficient documentation

## 2010-12-10 DIAGNOSIS — I10 Essential (primary) hypertension: Secondary | ICD-10-CM

## 2010-12-10 HISTORY — DX: Gastro-esophageal reflux disease without esophagitis: K21.9

## 2010-12-10 HISTORY — DX: Overweight: E66.3

## 2010-12-10 MED ORDER — GUAIFENESIN 400 MG PO TABS: 400.0000 mg | ORAL_TABLET | Freq: Two times a day (BID) | ORAL | Status: DC

## 2010-12-10 MED ORDER — CHLORTHALIDONE 25 MG PO TABS: 25.0000 mg | ORAL_TABLET | Freq: Every day | ORAL | Status: DC

## 2010-12-10 MED ORDER — ALBUTEROL SULFATE HFA 108 (90 BASE) MCG/ACT IN AERS: 1.0000 | INHALATION_SPRAY | Freq: Four times a day (QID) | RESPIRATORY_TRACT | Status: DC | PRN

## 2010-12-10 MED ORDER — FLUCONAZOLE 150 MG PO TABS: ORAL_TABLET | ORAL | Status: DC

## 2010-12-10 MED ORDER — AMOXICILLIN-POT CLAVULANATE 875-125 MG PO TABS: 1.0000 | ORAL_TABLET | Freq: Two times a day (BID) | ORAL | Status: AC

## 2010-12-10 NOTE — Assessment & Plan Note (Signed)
Started on Augmentin 875, Mucinex bid and increase fluids, avoid all products containing Sudafed.

## 2010-12-10 NOTE — Patient Instructions (Signed)
Sinusitis Sinuses are air pockets within the bones of your face. The growth of bacteria within a sinus leads to infection. The infection prevents the sinuses from draining. This infection is called sinusitis. SYMPTOMS  There will be different areas of pain depending on which sinuses have become infected.  The maxillary sinuses often produce pain beneath the eyes.   Frontal sinusitis may cause pain in the middle of the forehead and above the eyes.  Other problems (symptoms) include:  Toothaches.   Colored, pus-like (purulent) drainage from the nose.   Swelling, warmth, and tenderness over the sinus areas may be signs of infection.  TREATMENT  Sinusitis is most often determined by an exam.X-rays may be taken. If x-rays have been taken, make sure you obtain your results or find out how you are to obtain them. Your caregiver may give you medications (antibiotics). These are medications that will help kill the bacteria causing the infection. You may also be given a medication (decongestant) that helps to reduce sinus swelling.  HOME CARE INSTRUCTIONS   Only take over-the-counter or prescription medicines for pain, discomfort, or fever as directed by your caregiver.   Drink extra fluids. Fluids help thin the mucus so your sinuses can drain more easily.   Applying either moist heat or ice packs to the sinus areas may help relieve discomfort.   Use saline nasal sprays to help moisten your sinuses. The sprays can be found at your local drugstore.  SEEK IMMEDIATE MEDICAL CARE IF:  You have a fever.   You have increasing pain, severe headaches, or toothache.   You have nausea, vomiting, or drowsiness.   You develop unusual swelling around the face or trouble seeing.  MAKE SURE YOU:   Understand these instructions.   Will watch your condition.   Will get help right away if you are not doing well or get worse.  Document Released: 01/25/2005 Document Revised: 10/07/2010 Document Reviewed:  08/24/2006 [facility] Patient Information 2012 [place], [place].    P/U Mucinex and Mylanta at [facility]

## 2010-12-10 NOTE — Assessment & Plan Note (Signed)
Symptoms occur occasional when she lies down after eating certain foods and an episode last night of some chest discomfort. Encouraged to try some Mylanta prn and notify us of further symptoms so we can reevaluate

## 2010-12-10 NOTE — Assessment & Plan Note (Signed)
Notes h/o only minimal chest tightness, given sample of Ventolin to use prn

## 2010-12-10 NOTE — Progress Notes (Signed)
Candice Parker [id number] 1970/03/13 12/10/2010      Progress Note-Follow Up  Subjective  Chief Complaint  Chief Complaint  Patient presents with  . Sinusitis    X 2 days  . Headache    X 2 days  . Dental Pain    tooth ache X 1 day    HPI  Patient is a 40 year old [place] American female who is in today with increased congestion. She's been struggling with worsening symptoms over the last 5 days. She describes generalized headache, facial pressure and nasal congestion. She then describes sensation of a dull toothache. She denies fevers chills, nausea, diarrhea, anorexia and does note some malaise and fatigue. Last night she took a dose of Alka Seltzer allergy and sinus, slept well but then woke up with worsening headache and congestion today. Snuffbox rhinorrhea. Has had a cough and postnasal drip sometimes productive of yellow sputum. Has had some mild resolved over time when she sat up. She has no chest pain today. No change in bowels noted. She has not recently been taking her blood pressure medications for her Loestrin and Adderall.chest discomfort and shortness of breath as well. Last night she describes lying down after eating and having what she describes as a burning sensation in her chest she attributed to heartburn.  Past Medical History  Diagnosis Date  . ASTHMA, INTRINSIC 01/29/2009  . Palpitations 10/02/2009  . Hypertension   . Acute sinusitis 12/10/2010  . Overweight 12/10/2010    Past Surgical History  Procedure Date  . Elbow fracture surgery   . Inner ear surgery   . Total abdominal hysterectomy     Family History  Problem Relation Age of Onset  . Hypertension Mother   . Heart failure Father   . Kidney disease Father     failure    History   Social History  . Marital Status: Single    Spouse Name: N/A    Number of Children: N/A  . Years of Education: N/A   Occupational History  . Not on file.   Social History Main Topics  . Smoking status: Never  Smoker   . Smokeless tobacco: Never Used  . Alcohol Use: Yes  . Drug Use: No  . Sexually Active: Not on file   Other Topics Concern  . Not on file   Social History Narrative  . No narrative on file    Current Outpatient Prescriptions on File Prior to Visit  Medication Sig Dispense Refill  . amphetamine-dextroamphetamine (ADDERALL XR) 20 MG 24 hr capsule Take 1 capsule (20 mg total) by mouth every morning. Do not fill 11/02/10  30 capsule  0  . valACYclovir (VALTREX) 500 MG tablet Take 500 mg by mouth 3 (three) times daily.          No Known Allergies  Review of Systems  Review of Systems  Constitutional: Negative for fever, chills and malaise/fatigue.  HENT: Positive for congestion. Negative for neck pain.        Took a dose of Alka Seltzer Allergy and Sinus lasat night, slept well but then woke up feeling worse, More HA, facial pressure and congestion, rhinorrhea has just started today  Eyes: Negative for discharge.  Respiratory: Positive for cough, sputum production and shortness of breath. Negative for wheezing.   Cardiovascular: Positive for chest pain. Negative for palpitations, orthopnea, leg swelling and PND.       Describes some chest discomfort last night upon lying down that she attributed to reflux. Has  not recurred today and did not have any associated palpitations, diaphoresis or nausea  Gastrointestinal: Positive for heartburn. Negative for nausea, vomiting, abdominal pain, diarrhea and constipation.  Genitourinary: Negative for dysuria.  Musculoskeletal: Positive for myalgias and joint pain. Negative for falls.  Skin: Negative for rash.  Neurological: Positive for headaches. Negative for loss of consciousness.  Endo/Heme/Allergies: Negative for polydipsia.  Psychiatric/Behavioral: Negative for depression and suicidal ideas. The patient is not nervous/anxious and does not have insomnia.     Objective  BP 153/94  Pulse 80  Temp(Src) 98.1 F (36.7 C) (Oral)   Ht 5\' 8"  (1.727 m)  Wt 217 lb 6.4 oz (98.612 kg)  BMI 33.06 kg/m2  SpO2 97%  Physical Exam  Physical Exam  Constitutional: She is oriented to person, place, and time and well-developed, well-nourished, and in no distress. No distress.  HENT:  Head: Normocephalic and atraumatic.  Right Ear: External ear normal.  Left Ear: External ear normal.  Mouth/Throat: Oropharynx is clear and moist. No oropharyngeal exudate.       Nasal mucosa boggy and erythematous  Eyes: Conjunctivae are normal. Pupils are equal, round, and reactive to light. Right eye exhibits no discharge. Left eye exhibits no discharge. No scleral icterus.  Neck: Normal range of motion. Neck supple. No thyromegaly present.  Cardiovascular: Normal rate, regular rhythm, normal heart sounds and intact distal pulses.   No murmur heard. Pulmonary/Chest: Effort normal and breath sounds normal. No respiratory distress. She has no wheezes. She has no rales.  Abdominal: Soft. Bowel sounds are normal.  Musculoskeletal: Normal range of motion. She exhibits no edema.  Lymphadenopathy:    She has no cervical adenopathy.  Neurological: She is alert and oriented to person, place, and time. She has normal reflexes.  Skin: Skin is warm and dry. No rash noted. She is not diaphoretic.  Psychiatric: Mood, memory and affect normal.    Lab Results  Component Value Date   TSH 0.900 07/17/2010   Lab Results  Component Value Date   WBC 7.6 07/17/2010   HGB 13.8 07/17/2010   HCT 40.1 07/17/2010   MCV 88.7 07/17/2010   PLT 295 07/17/2010   Lab Results  Component Value Date   CREATININE 0.80 07/17/2010   BUN 10 07/17/2010   NA 141 07/17/2010   K 3.7 07/17/2010   CL 107 07/17/2010   CO2 27 07/17/2010   Lab Results  Component Value Date   ALT 14 09/29/2009   AST 18 09/29/2009   ALKPHOS 51 09/29/2009   BILITOT 0.9 09/29/2009   Lab Results  Component Value Date   CHOL 159 09/29/2009   Lab Results  Component Value Date   HDL 46.40 09/29/2009   Lab Results   Component Value Date   LDLCALC 103* 09/29/2009   Lab Results  Component Value Date   TRIG 49.0 09/29/2009   Lab Results  Component Value Date   CHOLHDL 3 09/29/2009     Assessment & Plan  Overweight Encouraged avoidance of trans fats/partially hydrogenated oils and try the DASH diet, handouts given  ASTHMA, INTRINSIC Notes h/o only minimal chest tightness, given sample of Ventolin to use prn  HTN (hypertension) Patient agrees to restart an antihypertensive, we will start with a low dose of Chlorthalidone 12.5mg  po daily and recheck her bp next week, encouraged the DASH diet and increased exercise  Acute sinusitis Started on Augmentin 875, Mucinex bid and increase fluids, avoid all products containing Sudafed.  Acid reflux Symptoms occur occasional when she  lies down after eating certain foods and an episode last night of some chest discomfort. Encouraged to try some Mylanta prn and notify us of further symptoms so we can reevaluate

## 2010-12-10 NOTE — Assessment & Plan Note (Signed)
Encouraged avoidance of trans fats/partially hydrogenated oils and try the DASH diet, handouts given

## 2010-12-10 NOTE — Assessment & Plan Note (Signed)
Patient agrees to restart an antihypertensive, we will start with a low dose of Chlorthalidone 12.5mg  po daily and recheck her bp next week, encouraged the DASH diet and increased exercise

## 2010-12-11 ENCOUNTER — Telehealth: Payer: Self-pay

## 2010-12-11 NOTE — Telephone Encounter (Signed)
I checked patients blood pressure and it was 138/91. Pt states she was only supposed to take 1/2 tablet and pt stated she took a whole tab yesterday and today. Should pt continue taking whole tab? Please advise?

## 2010-12-11 NOTE — Telephone Encounter (Signed)
Per md continue taking 1 whole tablet.

## 2010-12-14 ENCOUNTER — Telehealth: Payer: Self-pay

## 2010-12-14 NOTE — Telephone Encounter (Signed)
Patient informed. 

## 2010-12-14 NOTE — Telephone Encounter (Signed)
FYI: I checked patients BP this morning and it was 130/88 and pulse 84

## 2010-12-14 NOTE — Telephone Encounter (Signed)
Great, continue same and check a renal panel later in the week

## 2010-12-14 NOTE — Telephone Encounter (Signed)
FYI: Patients BP this morning was 130/88 and pulse 84

## 2010-12-16 ENCOUNTER — Telehealth: Payer: Self-pay

## 2010-12-16 MED ORDER — ACYCLOVIR 800 MG PO TABS: 800.0000 mg | ORAL_TABLET | Freq: Two times a day (BID) | ORAL | Status: DC

## 2010-12-16 NOTE — Telephone Encounter (Signed)
FYI: pts BP this morning was 140/83

## 2010-12-16 NOTE — Telephone Encounter (Signed)
Please send her in Acyclovir 800mg  po bid x 5 days for her viral facial rash. BP is OK

## 2011-02-18 ENCOUNTER — Encounter: Payer: Self-pay | Admitting: Internal Medicine

## 2011-02-18 ENCOUNTER — Ambulatory Visit (INDEPENDENT_AMBULATORY_CARE_PROVIDER_SITE_OTHER): Payer: 59 | Admitting: Internal Medicine

## 2011-02-18 VITALS — BP 140/95 | HR 80 | Temp 98.1°F | Ht 68.0 in | Wt 217.0 lb

## 2011-02-18 DIAGNOSIS — R413 Other amnesia: Secondary | ICD-10-CM | POA: Insufficient documentation

## 2011-02-18 DIAGNOSIS — I10 Essential (primary) hypertension: Secondary | ICD-10-CM

## 2011-02-18 DIAGNOSIS — F909 Attention-deficit hyperactivity disorder, unspecified type: Secondary | ICD-10-CM

## 2011-02-18 MED ORDER — AMLODIPINE BESYLATE 5 MG PO TABS: 5.0000 mg | ORAL_TABLET | Freq: Every day | ORAL | Status: DC

## 2011-02-18 MED ORDER — AMPHETAMINE-DEXTROAMPHET ER 20 MG PO CP24: 20.0000 mg | ORAL_CAPSULE | ORAL | Status: DC

## 2011-02-18 NOTE — Assessment & Plan Note (Signed)
Refer for formal testing--ADD i doubt that she has had a stroke or other CNS abnormality

## 2011-02-18 NOTE — Assessment & Plan Note (Signed)
Potential side effects on chlorthalidone Change to amlodipine Side effects discussed

## 2011-02-18 NOTE — Progress Notes (Signed)
Patient ID: Candice Parker, female   DOB: April 30, 1970, 41 y.o.   MRN: [id number] She is concerned with memory-notes occasional word finding deficits. sxs are intermittent and ongoing fo rtwo months or so.  htn---dxd at the time of an acute illness. She states thatwhen she takes new bp med that she will frequently get headache.   Past Medical History  Diagnosis Date  . ASTHMA, INTRINSIC 01/29/2009  . Palpitations 10/02/2009  . Hypertension   . Acute sinusitis 12/10/2010  . Overweight 12/10/2010    History   Social History  . Marital Status: Single    Spouse Name: N/A    Number of Children: N/A  . Years of Education: N/A   Occupational History  . Not on file.   Social History Main Topics  . Smoking status: Never Smoker   . Smokeless tobacco: Never Used  . Alcohol Use: Yes  . Drug Use: No  . Sexually Active: Not on file   Other Topics Concern  . Not on file   Social History Narrative  . No narrative on file    Past Surgical History  Procedure Date  . Elbow fracture surgery   . Inner ear surgery   . Total abdominal hysterectomy     Family History  Problem Relation Age of Onset  . Hypertension Mother   . Heart failure Father   . Kidney disease Father     failure    No Known Allergies  Current Outpatient Prescriptions on File Prior to Visit  Medication Sig Dispense Refill  . acyclovir (ZOVIRAX) 5 % ointment Apply 1 application topically every 3 (three) hours.        Marland Kitchen acyclovir (ZOVIRAX) 800 MG tablet Take 1 tablet (800 mg total) by mouth 2 (two) times daily.  10 tablet  0  . albuterol (PROVENTIL HFA;VENTOLIN HFA) 108 (90 BASE) MCG/ACT inhaler Inhale 1 puff into the lungs every 6 (six) hours as needed for wheezing.  1 Inhaler  0  . amphetamine-dextroamphetamine (ADDERALL XR) 20 MG 24 hr capsule Take 1 capsule (20 mg total) by mouth every morning. Do not fill 11/02/10  30 capsule  0  . chlorthalidone (HYGROTON) 25 MG tablet Take 1 tablet (25 mg total) by mouth daily.   30 tablet  2     patient denies chest pain, shortness of breath, orthopnea. Denies lower extremity edema, abdominal pain, change in appetite, change in bowel movements. Patient denies rashes, musculoskeletal complaints. No other specific complaints in a complete review of systems.   BP 140/95  Pulse 80  Temp(Src) 98.1 F (36.7 C) (Oral)  Ht 5\' 8"  (1.727 m)  Wt 217 lb (98.431 kg)  BMI 32.99 kg/m2  Well-developed well-nourished female in no acute distress. HEENT exam atraumatic, normocephalic, extraocular muscles are intact. Neck is supple. No jugular venous distention no thyromegaly. Chest clear to auscultation without increased work of breathing. Cardiac exam S1 and S2 are regular. Abdominal exam active bowel sounds, soft, nontender.

## 2011-02-19 ENCOUNTER — Ambulatory Visit: Payer: 59 | Admitting: Internal Medicine

## 2011-02-23 ENCOUNTER — Ambulatory Visit (INDEPENDENT_AMBULATORY_CARE_PROVIDER_SITE_OTHER): Payer: 59 | Admitting: Psychology

## 2011-02-23 DIAGNOSIS — F909 Attention-deficit hyperactivity disorder, unspecified type: Secondary | ICD-10-CM

## 2011-02-24 ENCOUNTER — Ambulatory Visit (INDEPENDENT_AMBULATORY_CARE_PROVIDER_SITE_OTHER): Payer: 59 | Admitting: Psychology

## 2011-02-24 DIAGNOSIS — F988 Other specified behavioral and emotional disorders with onset usually occurring in childhood and adolescence: Secondary | ICD-10-CM

## 2011-04-07 ENCOUNTER — Ambulatory Visit (INDEPENDENT_AMBULATORY_CARE_PROVIDER_SITE_OTHER): Payer: 59 | Admitting: Family Medicine

## 2011-04-07 ENCOUNTER — Encounter: Payer: Self-pay | Admitting: Family Medicine

## 2011-04-07 ENCOUNTER — Ambulatory Visit (HOSPITAL_BASED_OUTPATIENT_CLINIC_OR_DEPARTMENT_OTHER)
Admission: RE | Admit: 2011-04-07 | Discharge: 2011-04-07 | Disposition: A | Discharge status: Home / Self Care | Payer: 59 | Source: Ambulatory Visit | Attending: Family Medicine | Admitting: Family Medicine

## 2011-04-07 DIAGNOSIS — I10 Essential (primary) hypertension: Secondary | ICD-10-CM

## 2011-04-07 DIAGNOSIS — R0789 Other chest pain: Secondary | ICD-10-CM

## 2011-04-07 DIAGNOSIS — R079 Chest pain, unspecified: Secondary | ICD-10-CM | POA: Insufficient documentation

## 2011-04-07 MED ORDER — IBUPROFEN 400 MG PO TABS: 400.0000 mg | ORAL_TABLET | Freq: Four times a day (QID) | ORAL | Status: DC | PRN

## 2011-04-07 MED ORDER — IBUPROFEN 400 MG PO TABS: 400.0000 mg | ORAL_TABLET | Freq: Four times a day (QID) | ORAL | Status: AC | PRN

## 2011-04-07 MED ORDER — CYCLOBENZAPRINE HCL 10 MG PO TABS: 10.0000 mg | ORAL_TABLET | Freq: Two times a day (BID) | ORAL | Status: DC | PRN

## 2011-04-07 MED ORDER — CYCLOBENZAPRINE HCL 10 MG PO TABS: 10.0000 mg | ORAL_TABLET | Freq: Two times a day (BID) | ORAL | Status: AC | PRN

## 2011-04-11 ENCOUNTER — Encounter: Payer: Self-pay | Admitting: Family Medicine

## 2011-04-11 DIAGNOSIS — R0789 Other chest pain: Secondary | ICD-10-CM | POA: Insufficient documentation

## 2011-04-11 NOTE — Assessment & Plan Note (Signed)
Likely musculoskeletal. Pain worse with certain movements. She has been under increased stress the past few days, her teenage son was injured in a basketball game 2 nights ago and they spent the night in the ED. He is now in [place] for a BB tournament despite stitches and concussion. Her pain started in the middle of the night, she thought it was GI related but OTC reflux meds did not help. It is Left upper chest wall located . CXR and EKG are wnl. Is given Flexeril to use prn. Apply moist heat and try gentle stretching. Referred for rule out of cardiac causes. She will seek immediate medical care if symptoms worsen and do not resolve.

## 2011-04-11 NOTE — Assessment & Plan Note (Signed)
Patient has not been taking her Amlodipine recently for no apparent reason. Encouraged to restart her medications.

## 2011-04-11 NOTE — Progress Notes (Signed)
Patient ID: Candice Parker, female   DOB: 15-Mar-1970, 41 y.o.   MRN: [id number] Candice Parker [id number] 06-17-1970 04/11/2011      Progress Note-Follow Up  Subjective  Chief Complaint  Chief Complaint  Patient presents with  . Chest Pain    started around 10:30- 11 pm, pt took tums, 4-5 baby aspirin last night, and 2 ibuprofen this morning    HPI  Patient is a 41 year old African American female who is in today for evaluation testing and she woke up in the middle of the night with the chest pain in the left upper chest wall she describes as sharp and a 7/10. It was better if she lies on her right side. There was some radiation to her back. She tried taking TUMS took some baby aspirin some improvement but the pain did not resolve. She had some dyspepsia yesterday with eating but denied any heartburn sensations burning sour taste in the throat at night. She has been under a great deal of stress the last few days with her teenage son and her teenage son spending the night in the ER 2 nights ago when he was injured in a basketball game. She did not get any sleep at night and got very little sleep last night. The pain in her chest wall is worse with certain movements especially with movement of her left arm. No shortness of breath, palpitations, nausea, diaphoresis are noted with the pain. The pain was 7 of 10 last night and is now 3-4 of 1o in the office T Past Medical History  Diagnosis Date  . ASTHMA, INTRINSIC 01/29/2009  . Palpitations 10/02/2009  . Hypertension   . Acute sinusitis 12/10/2010  . Overweight 12/10/2010  . Chest pain, atypical 04/11/2011    Past Surgical History  Procedure Date  . Elbow fracture surgery   . Inner ear surgery   . Total abdominal hysterectomy     Family History  Problem Relation Age of Onset  . Hypertension Mother   . Heart failure Father   . Kidney disease Father     failure    History   Social History  . Marital Status: Single    Spouse Name:  N/A    Number of Children: N/A  . Years of Education: N/A   Occupational History  . Not on file.   Social History Main Topics  . Smoking status: Never Smoker   . Smokeless tobacco: Never Used  . Alcohol Use: Yes  . Drug Use: No  . Sexually Active: Not on file   Other Topics Concern  . Not on file   Social History Narrative  . No narrative on file    Current Outpatient Prescriptions on File Prior to Visit  Medication Sig Dispense Refill  . amphetamine-dextroamphetamine (ADDERALL XR) 20 MG 24 hr capsule Take 1 capsule (20 mg total) by mouth every morning. Do not fill 11/02/10  90 capsule  0  . acyclovir (ZOVIRAX) 5 % ointment Apply 1 application topically every 3 (three) hours.        Marland Kitchen albuterol (PROVENTIL HFA;VENTOLIN HFA) 108 (90 BASE) MCG/ACT inhaler Inhale 1 puff into the lungs every 6 (six) hours as needed for wheezing.  1 Inhaler  0  . amLODipine (NORVASC) 5 MG tablet Take 1 tablet (5 mg total) by mouth daily.  90 tablet  3    No Known Allergies  Review of Systems  Review of Systems  Constitutional: Negative for fever and malaise/fatigue.  HENT: Negative  for congestion.   Eyes: Negative for discharge.  Respiratory: Negative for shortness of breath.   Cardiovascular: Positive for chest pain. Negative for palpitations and leg swelling.  Gastrointestinal: Negative for nausea, abdominal pain and diarrhea.  Genitourinary: Negative for dysuria.  Musculoskeletal: Negative for falls.  Skin: Negative for rash.  Neurological: Negative for loss of consciousness and headaches.  Endo/Heme/Allergies: Negative for polydipsia.  Psychiatric/Behavioral: Negative for depression and suicidal ideas. The patient is nervous/anxious. The patient does not have insomnia.     Objective  BP 141/88  Pulse 68  Temp(Src) 98.9 F (37.2 C) (Temporal)  Ht 5\' 8"  (1.727 m)  SpO2 96%  Physical Exam  Physical Exam  Constitutional: She is oriented to person, place, and time and  well-developed, well-nourished, and in no distress. No distress.  HENT:  Head: Normocephalic and atraumatic.  Eyes: Conjunctivae are normal.  Neck: Neck supple. No thyromegaly present.  Cardiovascular: Normal rate, regular rhythm and normal heart sounds.   No murmur heard. Pulmonary/Chest: Effort normal and breath sounds normal. She has no wheezes.  Abdominal: She exhibits no distension and no mass.  Musculoskeletal: She exhibits no edema.  Lymphadenopathy:    She has no cervical adenopathy.  Neurological: She is alert and oriented to person, place, and time.  Skin: Skin is warm and dry. No rash noted. She is not diaphoretic.  Psychiatric: Memory, affect and judgment normal.    Lab Results  Component Value Date   TSH 0.900 07/17/2010   Lab Results  Component Value Date   WBC 7.6 07/17/2010   HGB 13.8 07/17/2010   HCT 40.1 07/17/2010   MCV 88.7 07/17/2010   PLT 295 07/17/2010   Lab Results  Component Value Date   CREATININE 0.80 07/17/2010   BUN 10 07/17/2010   NA 141 07/17/2010   K 3.7 07/17/2010   CL 107 07/17/2010   CO2 27 07/17/2010   Lab Results  Component Value Date   ALT 14 09/29/2009   AST 18 09/29/2009   ALKPHOS 51 09/29/2009   BILITOT 0.9 09/29/2009   Lab Results  Component Value Date   CHOL 159 09/29/2009   Lab Results  Component Value Date   HDL 46.40 09/29/2009   Lab Results  Component Value Date   LDLCALC 103* 09/29/2009   Lab Results  Component Value Date   TRIG 49.0 09/29/2009   Lab Results  Component Value Date   CHOLHDL 3 09/29/2009     Assessment & Plan  HTN (hypertension) Patient has not been taking her Amlodipine recently for no apparent reason. Encouraged to restart her medications.  Chest pain, atypical Likely musculoskeletal. Pain worse with certain movements. She has been under increased stress the past few days, her teenage son was injured in a basketball game 2 nights ago and they spent the night in the ED. He is now in [place] for a BB tournament despite  stitches and concussion. Her pain started in the middle of the night, she thought it was GI related but OTC reflux meds did not help. It is Left upper chest wall located . CXR and EKG are wnl. Is given Flexeril to use prn. Apply moist heat and try gentle stretching. Referred for rule out of cardiac causes. She will seek immediate medical care if symptoms worsen and do not resolve.

## 2011-04-28 ENCOUNTER — Ambulatory Visit (INDEPENDENT_AMBULATORY_CARE_PROVIDER_SITE_OTHER): Payer: 59 | Admitting: Cardiology

## 2011-04-28 ENCOUNTER — Encounter: Payer: Self-pay | Admitting: Cardiology

## 2011-04-28 DIAGNOSIS — R0989 Other specified symptoms and signs involving the circulatory and respiratory systems: Secondary | ICD-10-CM

## 2011-04-28 DIAGNOSIS — I10 Essential (primary) hypertension: Secondary | ICD-10-CM

## 2011-04-28 DIAGNOSIS — R0600 Dyspnea, unspecified: Secondary | ICD-10-CM

## 2011-04-28 DIAGNOSIS — R0609 Other forms of dyspnea: Secondary | ICD-10-CM

## 2011-04-28 DIAGNOSIS — R0789 Other chest pain: Secondary | ICD-10-CM

## 2011-04-28 DIAGNOSIS — R079 Chest pain, unspecified: Secondary | ICD-10-CM

## 2011-04-28 NOTE — Assessment & Plan Note (Signed)
Symptoms atypical and not consistent with cardiac pain. No further ischemia evaluation.

## 2011-04-28 NOTE — Patient Instructions (Signed)
Your physician recommends that you schedule a follow-up appointment in: AS NEEDED PENDING TEST RESULTS  Your physician has requested that you have an echocardiogram. Echocardiography is a painless test that uses sound waves to create images of your heart. It provides your doctor with information about the size and shape of your heart and how well your heart's chambers and valves are working. This procedure takes approximately one hour. There are no restrictions for this procedure.    

## 2011-04-28 NOTE — Progress Notes (Signed)
  HPI: 41 year old female with no prior cardiac history for evaluation of chest pain. Chest x-ray 04/07/2011 with no active disease. Patient states she has had occasional chest pain since age 43. She feels it may be related to stress. It is in the left chest area. Described as a sharp pain. Increased with lying flat and improved with changing positions. No radiation or associated symptoms. She does not have exertional chest pain. She does have some dyspnea on exertion but no orthopnea or pedal edema. No syncope. Because of the above we were asked to further evaluate.  Current Outpatient Prescriptions  Medication Sig Dispense Refill  . acyclovir (ZOVIRAX) 5 % ointment Apply 1 application topically every 3 (three) hours.        Marland Kitchen albuterol (PROVENTIL HFA;VENTOLIN HFA) 108 (90 BASE) MCG/ACT inhaler Inhale 1 puff into the lungs every 6 (six) hours as needed for wheezing.  1 Inhaler  0  . amphetamine-dextroamphetamine (ADDERALL XR) 20 MG 24 hr capsule Take 1 capsule (20 mg total) by mouth every morning. Do not fill 11/02/10  90 capsule  0  . Pediatric Multiple Vit-C-FA (MULTIVITAMIN ANIMAL SHAPES, WITH CA/FA,) WITH C & FA CHEW Chew 1 tablet by mouth daily.      Marland Kitchen amLODipine (NORVASC) 5 MG tablet Take 1 tablet (5 mg total) by mouth daily.  90 tablet  3    No Known Allergies  Past Medical History  Diagnosis Date  . ASTHMA, INTRINSIC 01/29/2009  . Hypertension   . Overweight 12/10/2010    Past Surgical History  Procedure Date  . Elbow fracture surgery   . Inner ear surgery   . Total abdominal hysterectomy     History   Social History  . Marital Status: Single    Spouse Name: N/A    Number of Children: 1  . Years of Education: N/A   Occupational History  .  [employer]    Office supervisor   Social History Main Topics  . Smoking status: Never Smoker   . Smokeless tobacco: Never Used  . Alcohol Use: Yes     Occasional  . Drug Use: No  . Sexually Active: Not on file   Other Topics  Concern  . Not on file   Social History Narrative  . No narrative on file    Family History  Problem Relation Age of Onset  . Hypertension Mother   . Heart failure Father   . Kidney disease Father     failure    ROS: Some problems with allergies but no fevers or chills, productive cough, hemoptysis, dysphasia, odynophagia, melena, hematochezia, dysuria, hematuria, rash, seizure activity, orthopnea, PND, pedal edema, claudication. Remaining systems are negative.  Physical Exam:   Blood pressure 130/82, pulse 72, height 5\' 8"  (1.727 m), weight 216 lb 0.6 oz (97.995 kg).  General:  Well developed/well nourished in NAD Skin warm/dry Patient not depressed No peripheral clubbing Back-normal HEENT-normal/normal eyelids Neck supple/normal carotid upstroke bilaterally; no bruits; no JVD; no thyromegaly chest - CTA/ normal expansion CV - RRR/normal S1 and S2; no murmurs, rubs or gallops;  PMI nondisplaced Abdomen -NT/ND, no HSM, no mass, + bowel sounds, no bruit 2+ femoral pulses, no bruits Ext-no edema, chords, 2+ DP Neuro-grossly nonfocal  ECG 04/07/2011-sinus rhythm with no ST changes.

## 2011-04-28 NOTE — Assessment & Plan Note (Signed)
Patient does have some dyspnea on exertion and has used weight loss drugs in the past. Schedule echocardiogram to quantify LV function and to exclude valvular disease.

## 2011-04-28 NOTE — Assessment & Plan Note (Signed)
Blood pressure controlled. Continue present meds

## 2011-05-13 ENCOUNTER — Ambulatory Visit (HOSPITAL_COMMUNITY): Payer: 59 | Attending: Cardiovascular Disease

## 2011-05-13 ENCOUNTER — Other Ambulatory Visit: Payer: Self-pay

## 2011-05-13 DIAGNOSIS — E669 Obesity, unspecified: Secondary | ICD-10-CM | POA: Insufficient documentation

## 2011-05-13 DIAGNOSIS — R0989 Other specified symptoms and signs involving the circulatory and respiratory systems: Secondary | ICD-10-CM | POA: Insufficient documentation

## 2011-05-13 DIAGNOSIS — R072 Precordial pain: Secondary | ICD-10-CM | POA: Insufficient documentation

## 2011-05-13 DIAGNOSIS — R0602 Shortness of breath: Secondary | ICD-10-CM | POA: Insufficient documentation

## 2011-05-13 DIAGNOSIS — R0609 Other forms of dyspnea: Secondary | ICD-10-CM | POA: Insufficient documentation

## 2011-05-13 DIAGNOSIS — J45909 Unspecified asthma, uncomplicated: Secondary | ICD-10-CM | POA: Insufficient documentation

## 2011-05-17 ENCOUNTER — Telehealth: Payer: Self-pay | Admitting: Cardiology

## 2011-05-17 NOTE — Telephone Encounter (Signed)
Fu call °Patient returning your call °

## 2011-05-17 NOTE — Telephone Encounter (Signed)
Spoke with pt, aware of echo results. 

## 2011-06-21 ENCOUNTER — Other Ambulatory Visit: Payer: Self-pay | Admitting: Family Medicine

## 2011-07-09 ENCOUNTER — Telehealth: Payer: Self-pay

## 2011-07-09 MED ORDER — DOXYCYCLINE HYCLATE 100 MG PO TABS: 100.0000 mg | ORAL_TABLET | Freq: Two times a day (BID) | ORAL | Status: AC

## 2011-07-09 NOTE — Telephone Encounter (Signed)
Patient called stating her sinus's started acting up on Saturday. She has a headache, sinus pressure, and teeth are hurting. Patient would like something called into [facility] pharmacy. Please advise?

## 2011-07-09 NOTE — Telephone Encounter (Signed)
Try doxyciycline 100mg  po bid for 7 days

## 2011-07-09 NOTE — Telephone Encounter (Signed)
rx sent in electronically, pt aware 

## 2011-08-16 ENCOUNTER — Encounter: Payer: Self-pay | Admitting: Family Medicine

## 2011-08-16 ENCOUNTER — Ambulatory Visit (INDEPENDENT_AMBULATORY_CARE_PROVIDER_SITE_OTHER): Payer: 59 | Admitting: Family Medicine

## 2011-08-16 VITALS — BP 138/82 | HR 105 | Temp 99.1°F

## 2011-08-16 DIAGNOSIS — J029 Acute pharyngitis, unspecified: Secondary | ICD-10-CM

## 2011-08-16 LAB — POCT RAPID STREP A (OFFICE): Rapid Strep A Screen: NEGATIVE

## 2011-08-16 NOTE — Addendum Note (Signed)
Addended by: Baldemar Lenis R on: 08/16/2011 12:08 PM   Modules accepted: Orders

## 2011-08-16 NOTE — Progress Notes (Signed)
OFFICE NOTE  08/16/2011  CC:  Chief Complaint  Patient presents with  . Generalized Body Aches    right ear pain, tired     HPI: Patient is a 41 y.o. African-American female who is here for "not feeling well". Reports 12-18 hour hx of ST, fatigue, right ear pain, achy in body, no rash, subjective fever but no temp checked.  +nausea but no vomiting.  No diarrhea.  +Headache.  Base of neck is achy. Slight nasal congestion, clear and not too thick.  No cough.  Pertinent PMH:  Past Medical History  Diagnosis Date  . ASTHMA, INTRINSIC 01/29/2009  . Hypertension   . Overweight 12/10/2010   Past Surgical History  Procedure Date  . Elbow fracture surgery   . Inner ear surgery   . Total abdominal hysterectomy     MEDS:  Outpatient Prescriptions Prior to Visit  Medication Sig Dispense Refill  . albuterol (PROVENTIL HFA;VENTOLIN HFA) 108 (90 BASE) MCG/ACT inhaler Inhale 1 puff into the lungs every 6 (six) hours as needed for wheezing.  1 Inhaler  0  . amLODipine (NORVASC) 5 MG tablet Take 1 tablet (5 mg total) by mouth daily.  90 tablet  3  . amphetamine-dextroamphetamine (ADDERALL XR) 20 MG 24 hr capsule Take 1 capsule (20 mg total) by mouth every morning. Do not fill 11/02/10  90 capsule  0  . Pediatric Multiple Vit-C-FA (MULTIVITAMIN ANIMAL SHAPES, WITH CA/FA,) WITH C & FA CHEW Chew 1 tablet by mouth daily.      Marland Kitchen acyclovir (ZOVIRAX) 5 % ointment Apply 1 application topically every 3 (three) hours.        Marland Kitchen acyclovir (ZOVIRAX) 800 MG tablet TAKE 1 TABLET (800 MG TOTAL) BY MOUTH 2 (TWO) TIMES DAILY.  10 tablet  2    PE: Blood pressure 138/82, pulse 105, temperature 99.1 F (37.3 C), temperature source Temporal. Gen: Alert,  tired- appearing but NAD.  Patient is oriented to person, place, time, and situation. ENT: Ears: EACs clear, normal epithelium.  TMs with good light reflex and landmarks bilaterally.  Eyes: no injection, icteris, swelling, or exudate.  EOMI, PERRLA. Nose: no  drainage or turbinate edema/swelling.  No injection or focal lesion.  Mouth: lips without lesion/swelling.  Oral mucosa pink and moist.  Dentition intact and without obvious caries or gingival swelling.  Oropharynx with mild diffuse tonsillar and uvular erythema with symmetric 2+ tonsills with slight whitish exudate on tonsils.   Neck - No masses or thyromegaly or limitation in range of motion.  She has mild TTP of submandibular node regions, R>L. CV: RRR, no m/r/g.   LUNGS: CTA bilat, nonlabored resps, good aeration in all lung fields. EXT: no clubbing, cyanosis, or edema.   Rapid strep: NEG  IMPRESSION AND PLAN:  Viral pharyngitis. Self-limited nature of this illness was discussed, questions answered.  Discussed symptomatic care, rest, fluids.  Sent swab for throat culture today. Warning signs/symptoms of worsening illness were discussed.  Patient instructed to call or return if any of these occur.   FOLLOW UP: prn

## 2011-08-18 ENCOUNTER — Other Ambulatory Visit: Payer: Self-pay | Admitting: Family Medicine

## 2011-08-18 LAB — CULTURE, GROUP A STREP

## 2011-08-18 MED ORDER — AMOXICILLIN 875 MG PO TABS: ORAL_TABLET | ORAL | Status: DC

## 2011-09-29 ENCOUNTER — Encounter: Payer: Self-pay | Admitting: Family Medicine

## 2011-09-29 ENCOUNTER — Ambulatory Visit (INDEPENDENT_AMBULATORY_CARE_PROVIDER_SITE_OTHER): Payer: 59 | Admitting: Family Medicine

## 2011-09-29 VITALS — BP 144/82 | HR 70 | Temp 96.8°F | Ht 68.0 in

## 2011-09-29 DIAGNOSIS — N649 Disorder of breast, unspecified: Secondary | ICD-10-CM

## 2011-09-29 DIAGNOSIS — I10 Essential (primary) hypertension: Secondary | ICD-10-CM

## 2011-09-29 HISTORY — DX: Disorder of breast, unspecified: N64.9

## 2011-09-29 NOTE — Assessment & Plan Note (Addendum)
A couple of weeks ago noted the lump for the first time. Uncomfortable when lying on it. Reviewed last MGM in 2009 for a discomfort in the left as well which was normal, irregular tissue noted in pie shaped wedge from roughly 7 oclock to 9 oclock and uncomfortable will proceed with MGM and she is instructed to stop caffeine for now til we know more and see if it helps symptoms.

## 2011-09-29 NOTE — Patient Instructions (Addendum)
Mammography Mammography is an X-ray of the breasts to look for changes that are not normal. The X-ray image is called a mammogram. This procedure can screen for breast cancer, can detect cancer early, and can diagnose cancer.  LET YOUR CAREGIVER KNOW ABOUT:  Breast implants.   Previous breast disease, biopsy, or surgery.   If you are breastfeeding.   Medicines taken, including vitamins, herbs, eyedrops, over-the-counter medicines, and creams.   Use of steroids (by mouth or creams).   Possibility of pregnancy, if this applies.  RISKS AND COMPLICATIONS  Exposure to radiation, but at very low levels.   The results may be misinterpreted.   The results may not be accurate.   Mammography may lead to further tests.   Mammography may not catch certain cancers.  BEFORE THE PROCEDURE  Schedule your test about 7 days after your menstrual period. This is when your breasts are the least tender and have signs of hormone changes.   If you have had a mammography done at a different facility in the past, get the mammogram X-rays or have them sent to your current exam facility in order to compare them.   Wash your breasts and under your arms the day of the test.   Do not wear deodorants, perfumes, or powders anywhere on your body.   Wear clothes that you can change in and out of easily.  PROCEDURE Relax as much as possible during the test. Any discomfort during the test will be very brief. The test should take less than 30 minutes. The following will happen:  You will undress from the waist up and put on a gown.   You will stand in front of the X-ray machine.   Each breast will be placed between 2 plastic or glass plates. The plates will compress your breast for a few seconds.   X-rays will be taken from different angles of the breast.  AFTER THE PROCEDURE  The mammogram will be examined.   Depending on the quality of the images, you may need to repeat certain parts of the test.    Ask when your test results will be ready. Make sure you get your test results.   You may resume normal activities.  Document Released: 01/23/2000 Document Revised: 01/14/2011 Document Reviewed: 11/15/2010 [facility] Patient Information 2012 [place], [place].

## 2011-10-03 NOTE — Progress Notes (Signed)
Patient ID: Candice Parker, female   DOB: 05-05-1970, 41 y.o.   MRN: [id number] DAIZHA ANAND [id number] March 20, 1970 10/03/2011      Progress Note-Follow Up  Subjective  Chief Complaint  Chief Complaint  Patient presents with  . lump under breast    HPI  Patient is a 41 year old [place] American female who is in today complaining of a couple of weeks of left breast pain and lesions. He denies any discharge or any similar lesions in the right breast. She notes menarche was at age 56 and she is usually regular. She did have a total abdominal hysterectomy 39 for fibroids and bleeding. She is a G1 P1. She otherwise notes she's had some shortness of breath headaches. Shortness of breath is worse when she lies down. The headaches have some associated lightheadedness with them. No chest pain, palpitations, GI or GU complaints.  Past Medical History  Diagnosis Date  . ASTHMA, INTRINSIC 01/29/2009  . Hypertension   . Overweight 12/10/2010  . Breast lesion 09/29/2011    Past Surgical History  Procedure Date  . Elbow fracture surgery   . Inner ear surgery   . Total abdominal hysterectomy     Family History  Problem Relation Age of Onset  . Hypertension Mother   . Heart failure Father   . Kidney disease Father     failure    History   Social History  . Marital Status: Single    Spouse Name: N/A    Number of Children: 1  . Years of Education: N/A   Occupational History  .  [employer]    Office supervisor   Social History Main Topics  . Smoking status: Never Smoker   . Smokeless tobacco: Never Used  . Alcohol Use: Yes     Occasional  . Drug Use: No  . Sexually Active: Not on file   Other Topics Concern  . Not on file   Social History Narrative  . No narrative on file    Current Outpatient Prescriptions on File Prior to Visit  Medication Sig Dispense Refill  . acyclovir (ZOVIRAX) 5 % ointment Apply 1 application topically every 3 (three) hours.        Marland Kitchen acyclovir  (ZOVIRAX) 800 MG tablet TAKE 1 TABLET (800 MG TOTAL) BY MOUTH 2 (TWO) TIMES DAILY.  10 tablet  2  . albuterol (PROVENTIL HFA;VENTOLIN HFA) 108 (90 BASE) MCG/ACT inhaler Inhale 1 puff into the lungs every 6 (six) hours as needed for wheezing.  1 Inhaler  0  . amLODipine (NORVASC) 5 MG tablet Take 1 tablet (5 mg total) by mouth daily.  90 tablet  3  . amphetamine-dextroamphetamine (ADDERALL XR) 20 MG 24 hr capsule Take 1 capsule (20 mg total) by mouth every morning. Do not fill 11/02/10  90 capsule  0  . Pediatric Multiple Vit-C-FA (MULTIVITAMIN ANIMAL SHAPES, WITH CA/FA,) WITH C & FA CHEW Chew 1 tablet by mouth daily.        No Known Allergies  Review of Systems  Review of Systems  Constitutional: Negative for fever and malaise/fatigue.  HENT: Negative for congestion.   Eyes: Negative for discharge.  Respiratory: Positive for shortness of breath.   Cardiovascular: Positive for chest pain. Negative for palpitations and leg swelling.  Gastrointestinal: Negative for nausea, abdominal pain and diarrhea.  Genitourinary: Negative for dysuria.  Musculoskeletal: Negative for falls.  Skin: Negative for rash.  Neurological: Negative for loss of consciousness and headaches.  Endo/Heme/Allergies: Negative for polydipsia.  Psychiatric/Behavioral: Negative for depression and suicidal ideas. The patient is not nervous/anxious and does not have insomnia.     Objective  BP 144/82  Pulse 70  Temp 96.8 F (36 C) (Temporal)  Ht 5\' 8"  (1.727 m)  SpO2 99%  Physical Exam  Physical Exam  Constitutional: She is oriented to person, place, and time and well-developed, well-nourished, and in no distress. No distress.  HENT:  Head: Normocephalic and atraumatic.  Eyes: Conjunctivae are normal.  Neck: Neck supple. No thyromegaly present.  Cardiovascular: Normal rate, regular rhythm and normal heart sounds.   No murmur heard. Pulmonary/Chest: Effort normal and breath sounds normal. She has no wheezes.    Abdominal: She exhibits no distension and no mass.  Genitourinary:       Left breast 6-8 oclock wedge of irregular tissue, firm, tender  Musculoskeletal: She exhibits no edema.  Lymphadenopathy:    She has no cervical adenopathy.  Neurological: She is alert and oriented to person, place, and time.  Skin: Skin is warm and dry. No rash noted. She is not diaphoretic.  Psychiatric: Memory, affect and judgment normal.    Lab Results  Component Value Date   TSH 0.900 07/17/2010   Lab Results  Component Value Date   WBC 7.6 07/17/2010   HGB 13.8 07/17/2010   HCT 40.1 07/17/2010   MCV 88.7 07/17/2010   PLT 295 07/17/2010   Lab Results  Component Value Date   CREATININE 0.80 07/17/2010   BUN 10 07/17/2010   NA 141 07/17/2010   K 3.7 07/17/2010   CL 107 07/17/2010   CO2 27 07/17/2010   Lab Results  Component Value Date   ALT 14 09/29/2009   AST 18 09/29/2009   ALKPHOS 51 09/29/2009   BILITOT 0.9 09/29/2009   Lab Results  Component Value Date   CHOL 159 09/29/2009   Lab Results  Component Value Date   HDL 46.40 09/29/2009   Lab Results  Component Value Date   LDLCALC 103* 09/29/2009   Lab Results  Component Value Date   TRIG 49.0 09/29/2009   Lab Results  Component Value Date   CHOLHDL 3 09/29/2009     Assessment & Plan  Breast lesion A couple of weeks ago noted the lump for the first time. Uncomfortable when lying on it. Reviewed last MGM in 2009 for a discomfort in the left as well which was normal, irregular tissue noted in pie shaped wedge from roughly 7 oclock to 9 oclock and uncomfortable will proceed with MGM and she is instructed to stop caffeine for now til we know more and see if it helps symptoms.  HTN (hypertension) Is not taking any meds at this time. Avoid caffeine and sodium, recheck bp next week

## 2011-10-03 NOTE — Assessment & Plan Note (Signed)
Is not taking any meds at this time. Avoid caffeine and sodium, recheck bp next week

## 2011-10-04 ENCOUNTER — Other Ambulatory Visit: Payer: Self-pay | Admitting: Family Medicine

## 2011-10-04 ENCOUNTER — Ambulatory Visit
Admission: RE | Admit: 2011-10-04 | Discharge: 2011-10-04 | Disposition: A | Discharge status: Home / Self Care | Payer: 59 | Source: Ambulatory Visit | Attending: Family Medicine | Admitting: Family Medicine

## 2011-10-04 DIAGNOSIS — N649 Disorder of breast, unspecified: Secondary | ICD-10-CM

## 2011-11-10 ENCOUNTER — Encounter: Payer: 59 | Attending: "Endocrinology | Admitting: *Deleted

## 2011-11-10 DIAGNOSIS — Z713 Dietary counseling and surveillance: Secondary | ICD-10-CM

## 2011-12-03 ENCOUNTER — Telehealth: Payer: Self-pay | Admitting: Internal Medicine

## 2011-12-03 NOTE — Telephone Encounter (Signed)
Dr Cato Mulligan is out of the office.  Per Dr Lovell Sheehan ok to call in xanax .25 mg bid prn #20 no refills.  Dr Cato Mulligan would need to approve a refill.  Rx called in to [facility], pt aware

## 2011-12-03 NOTE — Telephone Encounter (Signed)
Pt requesting medication for anxiety.  She is experiencing anxiety due to recent news of father's health who is terminally ill.  Uses pharmacy at [facility].

## 2011-12-07 NOTE — Progress Notes (Signed)
Patient attended the [facility]: Hypertension/High Cholesterol nutrition class on 11/10/11.  Topics covered include:   1. Complications of Hyperlipidemia and/or Hypertension. 2. Ways to reduce risk of heart disease.  3. Identifying fat and sodium content on food labels. 4. Ways to decrease sodium intake. 5. Optimal amount of daily saturated fat intake. 6. Optimal amount of daily sodium intake.  7. Foods to limit/avoid on a heart healthy diet.  Patient to follow-up with [facility] prn.   

## 2011-12-14 ENCOUNTER — Encounter: Payer: Self-pay | Admitting: *Deleted

## 2011-12-14 ENCOUNTER — Encounter: Payer: 59 | Attending: Internal Medicine | Admitting: *Deleted

## 2011-12-14 VITALS — Ht 68.5 in | Wt 214.8 lb

## 2011-12-14 DIAGNOSIS — E663 Overweight: Secondary | ICD-10-CM | POA: Insufficient documentation

## 2011-12-14 DIAGNOSIS — Z713 Dietary counseling and surveillance: Secondary | ICD-10-CM | POA: Insufficient documentation

## 2011-12-14 NOTE — Progress Notes (Signed)
Medical Nutrition Therapy:  Appt start time: 0800 end time:  0900.  Assessment:  Primary concerns today: obesity.   MEDICATIONS: none.  Stopped taking all medications and supplements   DIETARY INTAKE:  Usual eating pattern includes 2-3 meals and 0-3 late night snacks per day.  Everyday foods include corn, peas, sweet potatoes, mashed potato, baked potato, mac-n-cheese, spaghetti, chicken slafredo, rice, baked or grilled chicken, pork chops, hamburger steak, shrimp, fried fish sometimes, sometimes meatloaf, chicken pot pie, eggs, cheese, almonds, walnuts, and pecan, peanut butter, beans, broccoli, g beans, salad, collard greens, cabbage, asparagus, milk, yogurt, apples, banana, grapes, pineapples, canned peaches, applesauce. Avoided foods include all others.    24-hr recall:  B ( AM): instant oatmeal and coffee or instant grits with poptarts and coffee with a lot of sugar; maybe cereal on weekends or grits, eggs,a bacon, liver pudding,  Maybe waffles or toast with jelly Snk ( AM): none  L ( PM): fast food- ch[employer], mcdonald's, [employer] Snk ( PM): none D ( PM): may skip, may have cereal Snk ( PM): usually something sweet Beverages: sweet tea, sweet coffee, very seldom drinks soda, whole milk, not much water Not eating much currently due to dad's illness.  Usually an emotional eater.  Loves starches: comfort foods with extra sugar  Usual physical activity: none Works 7:30-5 pm; 1 hr lunch break  Estimated energy needs: 2000 calories 225 g carbohydrates 150 g protein 56 g fat  Progress Towards Goal(s):  In progress.   Nutritional Diagnosis:  Early-3.3 Overweight/obesity As related to emotional eating, meal skipping, inactivity, and excessive sugar consumption.  As evidenced by BMI of 32.2     Intervention:  Nutrition counseling provided.  Candice Parker is here for weight loss education.  She has always been slightly over weight, but she felt comfortable at about a size 10 instead of her  current size 16.  She has tried Weight Watchers, exercise, and weight loss supplements with various results, but stopped all due to the expense.  Currently her father is at the end of his life and she is very stressed out.  Her son just left for college and she's put on some weight over the past year due to emotional eating.  She has not been eating balanced meals, or even regular meals recently due to her father's illness.  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  Encouraged her to focus on her family for right now.  Decided this wasn't the best time to try and manage her weight.  Encouraged her to eat 3 meals/day as much as possible, even if her evening meal is something small like a sandwich or bowl of cereal.  Encouraged her to limit her tea consumption and drink more water for caloric restriction and for better hydration.  Also suggested getting in some activity during her lunch break for her mental stress relief, not necessarily for weight loss.  Suggested 7 minute workout.  Told her not to feel guilty for the food choices she's making during this time, but just focus on her family.     Monitoring/Evaluation:  Dietary intake, exercise, emotional readiness, and body weight prn.

## 2011-12-14 NOTE — Patient Instructions (Addendum)
Look for 7 minute workout app for phone Or go for walk or get up during the day, park farther away, take stairs instead of elevator, park and walk inside fast food instead of drive through Aim for 3 meals a day- get something little in evenings Aim for more water.  For every glass of tea, drink 1 glass of water  Don't feel guilty about food choices.

## 2012-02-01 ENCOUNTER — Telehealth: Payer: Self-pay

## 2012-02-01 MED ORDER — AMOXICILLIN 500 MG PO CAPS: 500.0000 mg | ORAL_CAPSULE | Freq: Three times a day (TID) | ORAL | Status: DC

## 2012-02-01 NOTE — Addendum Note (Signed)
Addended by: Court Joy on: 02/01/2012 12:12 PM   Modules accepted: Orders

## 2012-02-01 NOTE — Telephone Encounter (Signed)
Pt informed

## 2012-02-01 NOTE — Telephone Encounter (Signed)
OK to call in Amoxicillin 500 mg po tid x 10 days, due to exposure to strep and symptoms

## 2012-02-01 NOTE — Telephone Encounter (Signed)
Patient states she is not feeling well, feels like she has a fever, throat feels like its closing. Pt would like something called into [facility]

## 2012-02-01 NOTE — Telephone Encounter (Signed)
D/c Amlodipine, start Bystolic 5mg  po daily, sample of #7 tabs given, recheck BP in 5 days

## 2012-02-01 NOTE — Telephone Encounter (Signed)
Patient is also wandering if she can switch her BP medication to Bystolic?

## 2012-02-15 ENCOUNTER — Ambulatory Visit: Payer: 59 | Admitting: *Deleted

## 2012-03-15 ENCOUNTER — Other Ambulatory Visit: Payer: Self-pay | Admitting: *Deleted

## 2012-03-15 MED ORDER — AMPHETAMINE-DEXTROAMPHET ER 20 MG PO CP24: 20.0000 mg | ORAL_CAPSULE | ORAL | Status: DC

## 2012-09-01 ENCOUNTER — Telehealth: Payer: Self-pay | Admitting: Internal Medicine

## 2012-09-01 MED ORDER — AMPHETAMINE-DEXTROAMPHET ER 20 MG PO CP24: 20.0000 mg | ORAL_CAPSULE | ORAL | Status: DC

## 2012-09-01 NOTE — Telephone Encounter (Signed)
Ok with me 

## 2012-09-01 NOTE — Telephone Encounter (Signed)
Pt is requesting refill on her amphetamine-dextroamphetamine (ADDERALL XR) 20 MG 24 hr capsule She thinks it was #90 pills at each time - cheaper at pharmacy. She thinks Dr Lovell Sheehan may have refilled last time. Please call when ready.  Also, please call her today if you can - she wants to ask about possibly switching docs? Thanks.

## 2012-09-01 NOTE — Telephone Encounter (Signed)
ok 

## 2012-09-01 NOTE — Telephone Encounter (Signed)
Pt wants to switch to [facility] because of Dr Cato Mulligan schedule.  Is that ok

## 2012-09-04 NOTE — Telephone Encounter (Signed)
Change noted - PCP changed in the chart. Encounter closed.

## 2012-09-29 ENCOUNTER — Encounter: Payer: 59 | Admitting: Family

## 2012-10-10 ENCOUNTER — Encounter: Payer: Self-pay | Admitting: Family

## 2012-10-10 ENCOUNTER — Other Ambulatory Visit: Payer: Self-pay | Admitting: Family

## 2012-10-10 ENCOUNTER — Ambulatory Visit (INDEPENDENT_AMBULATORY_CARE_PROVIDER_SITE_OTHER): Payer: 59 | Admitting: Family

## 2012-10-10 ENCOUNTER — Telehealth: Payer: Self-pay

## 2012-10-10 VITALS — BP 140/82 | HR 78 | Ht 68.5 in | Wt 219.0 lb

## 2012-10-10 DIAGNOSIS — E559 Vitamin D deficiency, unspecified: Secondary | ICD-10-CM

## 2012-10-10 DIAGNOSIS — Z113 Encounter for screening for infections with a predominantly sexual mode of transmission: Secondary | ICD-10-CM

## 2012-10-10 DIAGNOSIS — Z0000 Encounter for general adult medical examination without abnormal findings: Secondary | ICD-10-CM

## 2012-10-10 DIAGNOSIS — Z1231 Encounter for screening mammogram for malignant neoplasm of breast: Secondary | ICD-10-CM

## 2012-10-10 LAB — POCT URINALYSIS DIPSTICK
Protein, UA: NEGATIVE
Spec Grav, UA: 1.02

## 2012-10-10 LAB — COMPREHENSIVE METABOLIC PANEL
AST: 19 U/L (ref 0–37)
Albumin: 3.4 g/dL — ABNORMAL LOW (ref 3.5–5.2)
BUN: 7 mg/dL (ref 6–23)
CO2: 28 mEq/L (ref 19–32)
Calcium: 8.4 mg/dL (ref 8.4–10.5)
Chloride: 106 mEq/L (ref 96–112)
Creatinine, Ser: 0.8 mg/dL (ref 0.4–1.2)
GFR: 97.8 mL/min (ref >60.00)
Glucose, Bld: 93 mg/dL (ref 70–99)
Potassium: 3.4 mEq/L — ABNORMAL LOW (ref 3.5–5.1)

## 2012-10-10 LAB — LIPID PANEL
Cholesterol: 126 mg/dL (ref 0–200)
VLDL: 12.6 mg/dL (ref 0.0–40.0)

## 2012-10-10 LAB — CBC WITH DIFFERENTIAL/PLATELET
Basophils Relative: 0.7 % (ref 0.0–3.0)
Eosinophils Relative: 3.2 % (ref 0.0–5.0)
Hemoglobin: 13.1 g/dL (ref 12.0–15.0)
Lymphocytes Relative: 39 % (ref 12.0–46.0)
Monocytes Relative: 7.6 % (ref 3.0–12.0)
Neutro Abs: 2.8 10*3/uL (ref 1.4–7.7)
Neutrophils Relative %: 49.5 % (ref 43.0–77.0)
RBC: 4.34 Mil/uL (ref 3.87–5.11)
WBC: 5.7 10*3/uL (ref 4.5–10.5)

## 2012-10-10 MED ORDER — SULFAMETHOXAZOLE-TMP DS 800-160 MG PO TABS: 1.0000 | ORAL_TABLET | Freq: Two times a day (BID) | ORAL | Status: DC

## 2012-10-10 NOTE — Progress Notes (Signed)
Subjective:    Patient ID: Candice Parker, female    DOB: [birth year], 42 y.o.   MRN: [id number]  HPI This is a routine physical examination for this healthy  Female. Reviewed all health maintenance protocols including mammography and reviewed appropriate screening labs. Her immunization history was reviewed as well as her current medications and allergies refills of her chronic medications were given and the plan for yearly health maintenance was discussed all orders and referrals were made as appropriate.  Not currently taking blood pressure medication. No currently exercising.   Review of Systems  Constitutional: Negative.   HENT: Negative.   Eyes: Negative.   Respiratory: Negative.   Cardiovascular: Negative.   Gastrointestinal: Negative.   Endocrine: Negative.   Genitourinary: Negative.   Musculoskeletal: Negative.   Skin: Negative.   Allergic/Immunologic: Negative.   Neurological: Negative.   Hematological: Negative.   Psychiatric/Behavioral: Negative.    Past Medical History  Diagnosis Date  . ASTHMA, INTRINSIC 01/29/2009  . Hypertension   . Overweight(278.02) 12/10/2010  . Breast lesion 09/29/2011    History   Social History  . Marital Status: Single    Spouse Name: N/A    Number of Children: 1  . Years of Education: N/A   Occupational History  .  [employer]    Office supervisor   Social History Main Topics  . Smoking status: Never Smoker   . Smokeless tobacco: Never Used  . Alcohol Use: Yes     Comment: Occasional  . Drug Use: No  . Sexual Activity: Not on file   Other Topics Concern  . Not on file   Social History Narrative  . No narrative on file    Past Surgical History  Procedure Laterality Date  . Elbow fracture surgery    . Inner ear surgery    . Total abdominal hysterectomy      Family History  Problem Relation Age of Onset  . Hypertension Mother   . Hyperlipidemia Mother   . Heart failure Father   . Kidney disease Father    failure  . Cancer Paternal Grandmother   . Obesity Other     No Known Allergies  Current Outpatient Prescriptions on File Prior to Visit  Medication Sig Dispense Refill  . acyclovir (ZOVIRAX) 5 % ointment Apply 1 application topically every 3 (three) hours.        Marland Kitchen acyclovir (ZOVIRAX) 800 MG tablet TAKE 1 TABLET (800 MG TOTAL) BY MOUTH 2 (TWO) TIMES DAILY.  10 tablet  2  . amphetamine-dextroamphetamine (ADDERALL XR) 20 MG 24 hr capsule Take 1 capsule (20 mg total) by mouth every morning. Do not fill 11/02/10  90 capsule  0  . Pediatric Multiple Vit-C-FA (MULTIVITAMIN ANIMAL SHAPES, WITH CA/FA,) WITH C & FA CHEW Chew 1 tablet by mouth daily.      Marland Kitchen albuterol (PROVENTIL HFA;VENTOLIN HFA) 108 (90 BASE) MCG/ACT inhaler Inhale 1 puff into the lungs every 6 (six) hours as needed for wheezing.  1 Inhaler  0  . amoxicillin (AMOXIL) 500 MG capsule Take 1 capsule (500 mg total) by mouth 3 (three) times daily.  30 capsule  0  . nebivolol (BYSTOLIC) 5 MG tablet Take 5 mg by mouth daily.       No current facility-administered medications on file prior to visit.    BP 140/82  Pulse 78  Ht 5' 8.5" (1.74 m)  Wt 219 lb (99.338 kg)  BMI 32.81 kg/m2chart    Objective:   Physical Exam  Constitutional: She is oriented to person, place, and time. She appears well-developed and well-nourished.  HENT:  Head: Normocephalic.  Right Ear: External ear normal.  Left Ear: External ear normal.  Nose: Nose normal.  Mouth/Throat: Oropharynx is clear and moist.  Eyes: Conjunctivae and EOM are normal.  Neck: Normal range of motion. Neck supple. No thyromegaly present.  Cardiovascular: Normal rate, regular rhythm and normal heart sounds.   Pulmonary/Chest: Effort normal and breath sounds normal.  Abdominal: Soft. She exhibits no distension. There is no tenderness. There is no rebound.  Musculoskeletal: Normal range of motion. She exhibits no edema and no tenderness.  Neurological: She is alert and oriented to  person, place, and time. She has normal reflexes. She displays normal reflexes. No cranial nerve deficit. Coordination normal.  Skin: Skin is warm and dry.  Psychiatric: She has a normal mood and affect.          Assessment & Plan:  Assessment: 1. CPX 2. Screen STD 3. ADD 4. Hypertension  Plan: Encouraged healthy diet, exercise, monthly breast exams. Consider [employer] Run or C25K app to help reduce weight. We will hold Bystolic for now and she how she responds to weight reduction and exercise.

## 2012-10-10 NOTE — Telephone Encounter (Signed)
Message copied by Beverely Low on Tue Oct 10, 2012  4:56 PM ------      Message from: Adline Mango B      Created: Tue Oct 10, 2012  1:52 PM       Awaiting other labs.. Cholesterol is beautiful! Potassium a little low. Increase intake of green leafy veggies and potassium rich foods. ------

## 2012-10-10 NOTE — Patient Instructions (Signed)
1. [employer] Girls Run, Walk before you run.  2. C25K APP on phone.    Breast Self-Examination You should begin examining your breasts at age 40 even though the risk for breast cancer is low in this age group. It is important to become familiar with how your breasts look and feel. This is true for pregnant women, nursing mothers, women in menopause and women who have breast implants.  Women should examine their breasts once a month to look for changes and lumps. By doing monthly breast exams, you get to know how your breasts feel and how they can change from month to month. This allows you to pick up changes early. It can also offer you some reassurance that your breast health is good. This exam only takes minutes. Most breast lumps are not caused by cancer. If you find a lump, a special x-ray called a mammogram, or other tests may be needed to determine what is wrong.  Some of the signs that a breast lump is caused by cancer include:  Dimpling of the skin or changes in the shape of the breast or nipple.   A dark-colored or bloody discharge from the nipple.   Swollen lymph glands around the breast or in the armpit.   Redness of the breast or nipple.   Scaly nipple or skin on the breast.   Pain or swelling of the breast.  SELF-EXAM There are a few points to follow when doing a thorough breast exam. The best time to examine your breasts is 5 to 7 days after the menstrual period is over. During menstruation, the breasts are lumpier, and it may be more difficult to pick up changes. If you do not menstruate, have reached menopause or had a hysterectomy, examine your breasts the first day of every month. After three to four months, you will become more familiar with the variations of your breasts and more comfortable with the exam.  Perform your breast exam monthly. Keep a written record with breast changes or normal findings for each breast. This makes it easier to be sure of changes and to not solely  depend on memory for size, tenderness, or location. Try to do the exam at the same time each month, and write down where you are in your menstrual cycle if you are still menstruating.   Look at your breasts. Stand in front of a mirror with your hands clasped behind your head. Tighten your chest muscles and look for asymmetry. This means a difference in shape or contour from one breast to the other, such as puckers, dips or bumps. Look also for skin changes.   Lean forward with your hands on your hips. Again, look for symmetry and skin changes.   While showering, soap the breasts, and carefully feel the breasts with fingertips while holding the arm (on the side of the breast being examined) over the head. Do this with each breast carefully feeling for lumps or changes. Typically, a circular motion with moderate fingertip pressure should be used.   Repeat this exam while lying on your back, again with your arm behind your head and a pillow under your shoulders. Again, use your fingertips to examine both breasts, feeling for lumps and thickening. Begin at 1 o'clock and go clockwise around the whole breast.   At the end of your exam, gently squeeze each nipple to see if there is any drainage. Look for nipple changes, dimpling or redness.   Lastly, examine the upper chest and  clavicle areas and in your armpits.  It is not necessary to become alarmed if you find a breast lump. Most of them are not cancerous. However, it is necessary to see your caregiver if a lump is found in order to have it looked at. Document Released: 03/04/2004 Document Revised: 10/07/2010 Document Reviewed: 05/14/2008 [facility] Patient Information 2012 [place], [place].

## 2012-10-11 LAB — HSV(HERPES SIMPLEX VRS) I + II AB-IGG: HSV 1 Glycoprotein G Ab, IgG: 11.2 IV — ABNORMAL HIGH

## 2012-10-11 LAB — HIV ANTIBODY (ROUTINE TESTING W REFLEX): HIV: NONREACTIVE

## 2012-10-14 LAB — VITAMIN D 1,25 DIHYDROXY
Vitamin D 1, 25 (OH)2 Total: 60 pg/mL (ref 18–72)
Vitamin D3 1, 25 (OH)2: 60 pg/mL

## 2012-10-18 ENCOUNTER — Telehealth: Payer: Self-pay

## 2012-10-18 MED ORDER — AMPHETAMINE-DEXTROAMPHET ER 20 MG PO CP24: 20.0000 mg | ORAL_CAPSULE | ORAL | Status: DC

## 2012-10-18 NOTE — Telephone Encounter (Signed)
Ok to reprint. No record of being filled. Can pick up

## 2012-10-18 NOTE — Telephone Encounter (Signed)
Pt aware.

## 2012-10-18 NOTE — Telephone Encounter (Signed)
Pt thinks she may have dropped/lost her adderall Rx and would like PCP to check database to see if it has been filled. She also needs a new Rx

## 2012-11-06 ENCOUNTER — Encounter: Payer: Self-pay | Admitting: Family

## 2012-11-06 ENCOUNTER — Ambulatory Visit (INDEPENDENT_AMBULATORY_CARE_PROVIDER_SITE_OTHER): Payer: 59 | Admitting: Family

## 2012-11-06 VITALS — BP 138/84 | HR 84 | Wt 219.0 lb

## 2012-11-06 DIAGNOSIS — M545 Low back pain, unspecified: Secondary | ICD-10-CM

## 2012-11-06 DIAGNOSIS — J309 Allergic rhinitis, unspecified: Secondary | ICD-10-CM

## 2012-11-06 DIAGNOSIS — J029 Acute pharyngitis, unspecified: Secondary | ICD-10-CM

## 2012-11-06 MED ORDER — METHYLPREDNISOLONE 4 MG PO KIT: PACK | ORAL | Status: AC

## 2012-11-06 NOTE — Progress Notes (Signed)
  Subjective:    Patient ID: Candice Parker, female    DOB: 12-20-1970, 42 y.o.   MRN: [id number]  HPI  42 year old AAF, nonsmoker, is in today with c/o sore throat, ear pain, hoarseness, if she has in the ears that began after cutting the grass on Saturday. She has been taken Zyrtec that has helped some but continues to have a sore throat. Denies any fever.  Also complains of low back pain that she rates as 6/10, comes and goes, no worse with movement. Has not taken anything for relief. No injury.   Review of Systems  Constitutional: Negative.   HENT: Positive for sore throat and postnasal drip.   Respiratory: Negative.   Cardiovascular: Negative.   Gastrointestinal: Negative.   Genitourinary: Negative.   Musculoskeletal: Positive for back pain.  Skin: Negative.   Allergic/Immunologic: Positive for environmental allergies.  Neurological: Negative.   Hematological: Negative.   Psychiatric/Behavioral: Negative.        Objective:   Physical Exam  Constitutional: She appears well-developed and well-nourished.  HENT:  Right Ear: External ear normal.  Left Ear: External ear normal.  Nose: Nose normal.  Pharynx moderately red  Neck: Normal range of motion. Neck supple.  Cardiovascular: Normal rate, regular rhythm and normal heart sounds.   Pulmonary/Chest: Effort normal and breath sounds normal.  Musculoskeletal: Normal range of motion.  Neurological: She is alert. She has normal reflexes.  Skin: Skin is warm and dry.  Psychiatric: She has a normal mood and affect.   UA: WNL       Assessment & Plan:  Assessment:  1. Allergic Rhinitis 2. Low back pain 3. Muscle Strain  Plan: Medrol Dosepak as directed. Zyrtec as needed. Call the office and make questions or concerns here recheck as scheduled, and as needed.

## 2012-11-06 NOTE — Patient Instructions (Signed)
Allergic Rhinitis Allergic rhinitis is when the mucous membranes in the nose respond to allergens. Allergens are particles in the air that cause your body to have an allergic reaction. This causes you to release allergic antibodies. Through a chain of events, these eventually cause you to release histamine into the blood stream (hence the use of antihistamines). Although meant to be protective to the body, it is this release that causes your discomfort, such as frequent sneezing, congestion and an itchy runny nose.  CAUSES  The pollen allergens may come from grasses, trees, and weeds. This is seasonal allergic rhinitis, or "hay fever." Other allergens cause year-round allergic rhinitis (perennial allergic rhinitis) such as house dust mite allergen, pet dander and mold spores.  SYMPTOMS   Nasal stuffiness (congestion).  Runny, itchy nose with sneezing and tearing of the eyes.  There is often an itching of the mouth, eyes and ears. It cannot be cured, but it can be controlled with medications. DIAGNOSIS  If you are unable to determine the offending allergen, skin or blood testing may find it. TREATMENT   Avoid the allergen.  Medications and allergy shots (immunotherapy) can help.  Hay fever may often be treated with antihistamines in pill or nasal spray forms. Antihistamines block the effects of histamine. There are over-the-counter medicines that may help with nasal congestion and swelling around the eyes. Check with your caregiver before taking or giving this medicine. If the treatment above does not work, there are many new medications your caregiver can prescribe. Stronger medications may be used if initial measures are ineffective. Desensitizing injections can be used if medications and avoidance fails. Desensitization is when a patient is given ongoing shots until the body becomes less sensitive to the allergen. Make sure you follow up with your caregiver if problems continue. SEEK MEDICAL  CARE IF:   You develop fever (more than 100.5 F (38.1 C).  You develop a cough that does not stop easily (persistent).  You have shortness of breath.  You start wheezing.  Symptoms interfere with normal daily activities. Document Released: 10/20/2000 Document Revised: 04/19/2011 Document Reviewed: 05/01/2008 [employer] Patient Information 2014 [employer].  

## 2012-11-15 ENCOUNTER — Encounter: Payer: Self-pay | Admitting: Family

## 2012-12-11 ENCOUNTER — Encounter: Payer: 59 | Admitting: Family

## 2012-12-14 ENCOUNTER — Other Ambulatory Visit: Payer: Self-pay

## 2012-12-15 ENCOUNTER — Ambulatory Visit (INDEPENDENT_AMBULATORY_CARE_PROVIDER_SITE_OTHER): Payer: 59 | Admitting: Family

## 2012-12-15 ENCOUNTER — Encounter: Payer: Self-pay | Admitting: Family

## 2012-12-15 ENCOUNTER — Other Ambulatory Visit (HOSPITAL_COMMUNITY)
Admission: RE | Admit: 2012-12-15 | Discharge: 2012-12-15 | Disposition: A | Discharge status: Home / Self Care | Payer: 59 | Source: Ambulatory Visit | Attending: Family | Admitting: Family

## 2012-12-15 VITALS — BP 128/82 | HR 87 | Ht 68.0 in | Wt 220.0 lb

## 2012-12-15 DIAGNOSIS — Z01419 Encounter for gynecological examination (general) (routine) without abnormal findings: Secondary | ICD-10-CM | POA: Insufficient documentation

## 2012-12-15 DIAGNOSIS — N760 Acute vaginitis: Secondary | ICD-10-CM | POA: Insufficient documentation

## 2012-12-15 DIAGNOSIS — Z0000 Encounter for general adult medical examination without abnormal findings: Secondary | ICD-10-CM

## 2012-12-15 DIAGNOSIS — F988 Other specified behavioral and emotional disorders with onset usually occurring in childhood and adolescence: Secondary | ICD-10-CM

## 2012-12-15 DIAGNOSIS — Z124 Encounter for screening for malignant neoplasm of cervix: Secondary | ICD-10-CM

## 2012-12-15 DIAGNOSIS — E663 Overweight: Secondary | ICD-10-CM

## 2012-12-15 DIAGNOSIS — Z113 Encounter for screening for infections with a predominantly sexual mode of transmission: Secondary | ICD-10-CM | POA: Insufficient documentation

## 2012-12-15 MED ORDER — ADDERALL 10 MG PO TABS: 10.0000 mg | ORAL_TABLET | Freq: Every day | ORAL | Status: DC

## 2012-12-15 NOTE — Progress Notes (Signed)
Subjective:    Patient ID: Candice Parker, female    DOB: 13-Nov-1970, 42 y.o.   MRN: [id number]  HPI 42 year old AAF, nonsmoker, is in today for a physical examination for this healthy  Female. Reviewed all health maintenance protocols including mammography and reviewed appropriate screening labs. Her immunization history was reviewed as well as her current medications and allergies refills of her chronic medications were given and the plan for yearly health maintenance was discussed all orders and referrals were made as appropriate.  The patient has a history of attention deficit disorder and tolerating Adderall well. Patient has concerns of inability to reduce her weight. She walks daily during her lunch break. Reports having a habit of snacking late-night, typically a bowl of cereal.  Review of Systems  Constitutional: Negative.   HENT: Negative.   Eyes: Negative.   Respiratory: Negative.   Gastrointestinal: Negative.   Endocrine: Negative.   Genitourinary: Positive for vaginal discharge. Negative for frequency and vaginal pain.  Musculoskeletal: Negative.   Skin: Negative.   Allergic/Immunologic: Negative.   Hematological: Negative.   Psychiatric/Behavioral: Negative.    Past Medical History  Diagnosis Date  . ASTHMA, INTRINSIC 01/29/2009  . Hypertension   . Overweight(278.02) 12/10/2010  . Breast lesion 09/29/2011    History   Social History  . Marital Status: Single    Spouse Name: N/A    Number of Children: 1  . Years of Education: N/A   Occupational History  .  [employer]    Office supervisor   Social History Main Topics  . Smoking status: Never Smoker   . Smokeless tobacco: Never Used  . Alcohol Use: Yes     Comment: Occasional  . Drug Use: No  . Sexual Activity: Not on file   Other Topics Concern  . Not on file   Social History Narrative  . No narrative on file    Past Surgical History  Procedure Laterality Date  . Elbow fracture surgery    .  Inner ear surgery    . Total abdominal hysterectomy      Family History  Problem Relation Age of Onset  . Hypertension Mother   . Hyperlipidemia Mother   . Heart failure Father   . Kidney disease Father     failure  . Cancer Paternal Grandmother   . Obesity Other     No Known Allergies  Current Outpatient Prescriptions on File Prior to Visit  Medication Sig Dispense Refill  . acyclovir (ZOVIRAX) 5 % ointment Apply 1 application topically every 3 (three) hours.        Marland Kitchen acyclovir (ZOVIRAX) 800 MG tablet TAKE 1 TABLET (800 MG TOTAL) BY MOUTH 2 (TWO) TIMES DAILY.  10 tablet  2  . albuterol (PROVENTIL HFA;VENTOLIN HFA) 108 (90 BASE) MCG/ACT inhaler Inhale 1 puff into the lungs every 6 (six) hours as needed for wheezing.  1 Inhaler  0  . amoxicillin (AMOXIL) 500 MG capsule Take 1 capsule (500 mg total) by mouth 3 (three) times daily.  30 capsule  0  . amphetamine-dextroamphetamine (ADDERALL XR) 20 MG 24 hr capsule Take 1 capsule (20 mg total) by mouth every morning.  90 capsule  0  . nebivolol (BYSTOLIC) 5 MG tablet Take 5 mg by mouth daily.      . Pediatric Multiple Vit-C-FA (MULTIVITAMIN ANIMAL SHAPES, WITH CA/FA,) WITH C & FA CHEW Chew 1 tablet by mouth daily.      Marland Kitchen sulfamethoxazole-trimethoprim (BACTRIM DS) 800-160 MG per  tablet Take 1 tablet by mouth 2 (two) times daily.  14 tablet  0   No current facility-administered medications on file prior to visit.    BP 128/82  Pulse 87  Ht 5\' 8"  (1.727 m)  Wt 220 lb (99.791 kg)  BMI 33.46 kg/m2chart    Objective:   Physical Exam  Constitutional: She appears well-developed and well-nourished.  HENT:  Head: Normocephalic and atraumatic.  Right Ear: External ear normal.  Left Ear: External ear normal.  Nose: Nose normal.  Mouth/Throat: Oropharynx is clear and moist.  Eyes: Conjunctivae and EOM are normal. Pupils are equal, round, and reactive to light.  Neck: Normal range of motion. Neck supple. No thyromegaly present.   Cardiovascular: Normal rate, regular rhythm and normal heart sounds.   Pulmonary/Chest: Effort normal and breath sounds normal.  Abdominal: Soft. Bowel sounds are normal.  Genitourinary: Uterus normal. Guaiac negative stool. Vaginal discharge found.  Frothy white vaginal discharge  Musculoskeletal: Normal range of motion. She exhibits no edema and no tenderness.  Neurological: She is alert. She has normal reflexes. She displays normal reflexes. No cranial nerve deficit. Coordination normal.  Skin: Skin is warm and dry.  Psychiatric: She has a normal mood and affect.          Assessment & Plan:  Assessment: 1. Complete physical exam 2. Hypertension 3. Overweight 4. Attention deficit disorder  Plan: Continue Adderall takes are. We'll had an immediate release Adderall 10 mg in the afternoon of her day and recheck in 3 weeks. Pap smear sent. We'll follow with the patient and the results. Mammogram schedule him for this month.

## 2012-12-15 NOTE — Patient Instructions (Signed)

## 2012-12-15 NOTE — Progress Notes (Signed)
Pre-visit discussion using our clinic review tool. No additional management support is needed unless otherwise documented below in the visit note.  

## 2012-12-19 ENCOUNTER — Other Ambulatory Visit: Payer: Self-pay | Admitting: Family

## 2012-12-19 MED ORDER — METRONIDAZOLE 0.75 % VA GEL: 1.0000 | Freq: Every day | VAGINAL | Status: AC

## 2012-12-27 ENCOUNTER — Ambulatory Visit
Admission: RE | Admit: 2012-12-27 | Discharge: 2012-12-27 | Disposition: A | Discharge status: Home / Self Care | Payer: 59 | Source: Ambulatory Visit | Attending: Family | Admitting: Family

## 2012-12-27 DIAGNOSIS — Z1231 Encounter for screening mammogram for malignant neoplasm of breast: Secondary | ICD-10-CM

## 2013-01-01 ENCOUNTER — Encounter: Payer: 59 | Attending: Family | Admitting: *Deleted

## 2013-01-01 ENCOUNTER — Encounter: Payer: Self-pay | Admitting: *Deleted

## 2013-01-01 DIAGNOSIS — Z713 Dietary counseling and surveillance: Secondary | ICD-10-CM | POA: Insufficient documentation

## 2013-01-01 DIAGNOSIS — E663 Overweight: Secondary | ICD-10-CM | POA: Insufficient documentation

## 2013-01-01 NOTE — Progress Notes (Signed)
Medical Nutrition Therapy:  Appt start time: 1530 end time:  1600.  Assessment:  Primary concerns today: Albert is a [employer].  She has been seen at [facility] in previous years, but is here today for weight management education.  She would like to benefit from the Live Life Well benefit.  She was prescribed an additional 10 mg Aderral for appetite suppression.   She reports eating late into the middle of the night and early morning.  She has started walking during her lunch 15-30 minutes most days, but she feels like she is still struggling with weight loss.  She admits to sometimes feeling out of control sometimes with eating.  She denies feeling ashamed about her eating. She admits to eating without biological hunger.  She thinks she might be an emotional eater.  She had tried eating healthy eating and exercising, but that has not helped with weight loss.  She says that she has gradually gained weight over the years.  Her lowest weight as an adult 101 prior to pregnancy and she gained 102 pounds during pregnancy.  She states that she was able to lose some weight after delivery, but not all of it.  She was a  Size 6 prior to pregnancy, then was a 10 after delivery and is a comfortable size 16 now.  She reports not sleeping well at night.  She thinks that she might get 3-4 hours of sleep each night, but that isn't consistent sleep.  She denies feelings of sleepiness and her Aderral keeps her focused during the day.  She lives at home with her mother and her son is at college.  She states that her mother is morbidly obese and has terrible eating habits.  Abrar reports that her mother does most of the food shopping and cooking.  Brigida states that she eats out maybe 2-3 times a week: McDonald's, [employer] , [employer], [employer], [employer].  Nikk wants to be healthy.  She eats in her bed while watching tv.  She will eat lunch in the break room at work.  She does not eat in the car. She does not  like to talk while eating  Preferred Learning Style:   Auditory   Learning Readiness:  Ready   MEDICATIONS: see list.  Hasn't filled the 10 mg prescription yet   DIETARY INTAKE:  Usual eating pattern includes 2-3 meals and 0-2 snacks per day.  Everyday foods include protein, starch.  Avoided foods include none.    24-hr recall:  B ( AM): coffee, instant oatmeal  Snk ( AM): not really  L ( PM): bologna and cheese sandwich with potato chips.  Might not eat lunch at all.  Or might overeat at the buffet.  Snk ( PM): not really D ( PM): [place] bacon, eggs, cheese, grits and jelly toast Snk ( PM): bowl cereal Beverages: water with Crystal Light, but not enough  Usual physical activity: walks 15 minutes most days  Estimated energy needs: 1600-1800 calories    Nutritional Diagnosis:  NB-1.5 Disordered eating pattern As related to appetite suppression from medication.  As evidenced by meal skipping during the day and overconsumption at night.    Intervention:  Nutrition counseling provided.  Encouraged patient to reject traditional diet mentality of "good" vs "bad" foods.  There are no good and bad foods, but rather food is fuel that we needs for our bodies.  When we don't get enough fuel, our bodies suffer the metabolic consequences.  Encouraged patient to eat whatever foods will satisfy them, regardless of their nutritional value.  We will discuss nutritional values of foods at a subsequent appointment.  Encouraged patient to honor their body's internal hunger and fullness cues.  Throughout the day, check in mentally and rate hunger.  Try not to eat when ravenous, but instead when slightly hungry.  Then choose food(s) that will be satisfying regardless of nutritional content.  Sit down to enjoy those foods.  Minimize distractions: turn off tv, put away books, work, Programmer, applications.  Make the meal last at least 20 minutes in order to give time to experience and register satiety.  Stop  eating when full regardless of how much food is left on the plate.  Get more if still hungry.  The key is to honor fullness so throughout the meal, rate fullness factor and stop when comfortably full, but not stuffed.  Reminded patient that they can have any food they want, whenever they want, and however much they want.  Eventually the novelty will wear out and each food will be equal in terms of its emotional appeal.  This will be a learning process and some days more food will be eaten, some days less.  The key is to honor hunger and fullness without any feelings of guilt.  Pay attention to what the internal cues are, rather than any external factors.   Teaching Method Utilized:  Visual Auditory   Barriers to learning/adherence to lifestyle change: none  Demonstrated degree of understanding via:  Teach Back   Monitoring/Evaluation:  Dietary intake, exercise, and body weight in 1 month(s).

## 2013-01-16 ENCOUNTER — Encounter: Payer: Self-pay | Admitting: Family

## 2013-01-17 ENCOUNTER — Encounter: Payer: Self-pay | Admitting: *Deleted

## 2013-01-18 ENCOUNTER — Ambulatory Visit (INDEPENDENT_AMBULATORY_CARE_PROVIDER_SITE_OTHER): Payer: 59 | Admitting: Family

## 2013-01-18 VITALS — BP 144/102 | HR 90 | Wt 223.5 lb

## 2013-01-18 DIAGNOSIS — I10 Essential (primary) hypertension: Secondary | ICD-10-CM

## 2013-01-18 DIAGNOSIS — F988 Other specified behavioral and emotional disorders with onset usually occurring in childhood and adolescence: Secondary | ICD-10-CM

## 2013-01-18 DIAGNOSIS — F411 Generalized anxiety disorder: Secondary | ICD-10-CM

## 2013-01-18 MED ORDER — LISINOPRIL-HYDROCHLOROTHIAZIDE 20-12.5 MG PO TABS: 1.0000 | ORAL_TABLET | Freq: Every day | ORAL | Status: DC

## 2013-01-18 NOTE — Patient Instructions (Addendum)
Antihistamine at bedtime for allergic rhinitis and insomnia  Sodium-Controlled Diet Sodium is a mineral. It is found in many foods. Sodium may be found naturally or added during the making of a food. The most common form of sodium is salt, which is made up of sodium and chloride. Reducing your sodium intake involves changing your eating habits. The following guidelines will help you reduce the sodium in your diet:  Stop using the salt shaker.  Use salt sparingly in cooking and baking.  Substitute with sodium-free seasonings and spices.  Do not use a salt substitute (potassium chloride) without your caregiver's permission.  Include a variety of fresh, unprocessed foods in your diet.  Limit the use of processed and convenience foods that are high in sodium. USE THE FOLLOWING FOODS SPARINGLY: Breads/Starches  Commercial bread stuffing, commercial pancake or waffle mixes, coating mixes. Waffles. Croutons. Prepared (boxed or frozen) potato, rice, or noodle mixes that contain salt or sodium. Salted [place] fries or hash browns. Salted popcorn, breads, crackers, chips, or snack foods. Vegetables  Vegetables canned with salt or prepared in cream, butter, or cheese sauces. Sauerkraut. Tomato or vegetable juices canned with salt.  Fresh vegetables are allowed if rinsed thoroughly. Fruit  Fruit is okay to eat. Meat and Meat Substitutes  Salted or smoked meats, such as bacon or Canadian bacon, chipped or corned beef, hot dogs, salt pork, luncheon meats, pastrami, ham, or sausage. Canned or smoked fish, poultry, or meat. Processed cheese or cheese spreads, blue or Roquefort cheese. Battered or frozen fish products. Prepared spaghetti sauce. Baked beans. Reuben sandwiches. Salted nuts. Caviar. Milk  Limit buttermilk to 1 cup per week. Soups and Combination Foods  Bouillon cubes, canned or dried soups, broth, consomm. Convenience (frozen or packaged) dinners with more than 600 mg sodium. Pot  pies, pizza, Asian food, fast food cheeseburgers, and specialty sandwiches. Desserts and Sweets  Regular (salted) desserts, pie, commercial fruit snack pies, commercial snack cakes, canned puddings.  Eat desserts and sweets in moderation. Fats and Oils  Gravy mixes or canned gravy. No more than 1 to 2 tbs of salad dressing. Chip dips.  Eat fats and oils in moderation. Beverages  See those listed under the vegetables and milk groups. Condiments  Ketchup, mustard, meat sauces, salsa, regular (salted) and lite soy sauce or mustard. Dill pickles, olives, meat tenderizer. Prepared horseradish or pickle relish. Dutch-processed cocoa. Baking powder or baking soda used medicinally. Worcestershire sauce. "Light" salt. Salt substitute, unless approved by your caregiver. Document Released: 07/17/2001 Document Revised: 04/19/2011 Document Reviewed: 02/17/2009 [facility] Patient Information 2014 [place], [place].

## 2013-01-19 ENCOUNTER — Encounter: Payer: Self-pay | Admitting: Family

## 2013-01-19 NOTE — Progress Notes (Signed)
Subjective:    Patient ID: Candice Parker, female    DOB: 01-04-1971, 42 y.o.   MRN: [id number]  HPI 42 year old African American female, nonsmoker with a history of hypertension, attention deficit disorder and anxiety presents today with elevated blood pressure. She has been taking amlodipine 5 mg is noticing that her blood pressures been running high. She is also try Xanax 0.25 mg one half tablet to help sleep as well as reduction of her blood pressure that has helped some. She's also noticed peripheral edema and a dull headache. Patient recently switched jobs but reports that she is very happy there. Concerned about smelling mold that is associated with a dull headache. She denies any sneezing, coughing, or congestion. Blood pressures been in the 170s systolically at home. She is unsure of her blood pressure cuff is accurate.   Review of Systems  Constitutional: Negative.   HENT: Negative for congestion, sinus pressure, sore throat and tinnitus.   Respiratory: Negative.   Cardiovascular: Negative.   Gastrointestinal: Negative.   Endocrine: Negative.   Genitourinary: Negative.   Musculoskeletal: Negative.   Skin: Negative.   Allergic/Immunologic: Negative.   Neurological: Positive for headaches. Negative for dizziness, tremors and speech difficulty.  Psychiatric/Behavioral: The patient is nervous/anxious.    Past Medical History  Diagnosis Date  . ASTHMA, INTRINSIC 01/29/2009  . Hypertension   . Overweight(278.02) 12/10/2010  . Breast lesion 09/29/2011    History   Social History  . Marital Status: Single    Spouse Name: N/A    Number of Children: 1  . Years of Education: N/A   Occupational History  .  [employer]    Office supervisor   Social History Main Topics  . Smoking status: Never Smoker   . Smokeless tobacco: Never Used  . Alcohol Use: Yes     Comment: Occasional  . Drug Use: No  . Sexual Activity: Not on file   Other Topics Concern  . Not on file    Social History Narrative  . No narrative on file    Past Surgical History  Procedure Laterality Date  . Elbow fracture surgery    . Inner ear surgery    . Total abdominal hysterectomy      Family History  Problem Relation Age of Onset  . Hypertension Mother   . Hyperlipidemia Mother   . Heart failure Father   . Kidney disease Father     failure  . Cancer Paternal Grandmother   . Obesity Other     No Known Allergies  Current Outpatient Prescriptions on File Prior to Visit  Medication Sig Dispense Refill  . acyclovir (ZOVIRAX) 5 % ointment Apply 1 application topically every 3 (three) hours.        Marland Kitchen acyclovir (ZOVIRAX) 800 MG tablet TAKE 1 TABLET (800 MG TOTAL) BY MOUTH 2 (TWO) TIMES DAILY.  10 tablet  2  . ADDERALL 10 MG tablet Take 1 tablet (10 mg total) by mouth daily with breakfast.  30 tablet  0  . albuterol (PROVENTIL HFA;VENTOLIN HFA) 108 (90 BASE) MCG/ACT inhaler Inhale 1 puff into the lungs every 6 (six) hours as needed for wheezing.  1 Inhaler  0  . amoxicillin (AMOXIL) 500 MG capsule Take 1 capsule (500 mg total) by mouth 3 (three) times daily.  30 capsule  0  . amphetamine-dextroamphetamine (ADDERALL XR) 20 MG 24 hr capsule Take 1 capsule (20 mg total) by mouth every morning.  90 capsule  0  . nebivolol (  BYSTOLIC) 5 MG tablet Take 5 mg by mouth daily.      . Pediatric Multiple Vit-C-FA (MULTIVITAMIN ANIMAL SHAPES, WITH CA/FA,) WITH C & FA CHEW Chew 1 tablet by mouth daily.      Marland Kitchen sulfamethoxazole-trimethoprim (BACTRIM DS) 800-160 MG per tablet Take 1 tablet by mouth 2 (two) times daily.  14 tablet  0   No current facility-administered medications on file prior to visit.    BP 144/102  Pulse 90  Wt 223 lb 8 oz (101.379 kg)chart    Objective:   Physical Exam  Constitutional: She is oriented to person, place, and time. She appears well-developed and well-nourished.  HENT:  Right Ear: External ear normal.  Left Ear: External ear normal.  Nose: Nose normal.   Mouth/Throat: Oropharynx is clear and moist.  Neck: Normal range of motion. Neck supple.  Cardiovascular: Normal rate, regular rhythm and normal heart sounds.   Pulmonary/Chest: Effort normal and breath sounds normal.  Abdominal: Soft. Bowel sounds are normal.  Musculoskeletal: Normal range of motion.  Neurological: She is alert and oriented to person, place, and time.  Skin: Skin is warm and dry.  Psychiatric: She has a normal mood and affect.          Assessment & Plan:  Assessment: 1. Hypertension-uncontrolled 2. Attention deficit disorder 3. Anxiety 4. Insomnia  Plan: Discontinue amlodipine. Start lisinopril HCT 20/12.5 once daily. Encouraged antihistamine at night to help with bowel headache potentially related to sinus inflammation as well as to help sleep. We'll see how she responds to this therapy. Bring patient back for recheck in 3 weeks and sooner as needed. Her Blood pressure cuff is approximately 30 points off systolically.

## 2013-02-13 ENCOUNTER — Ambulatory Visit (INDEPENDENT_AMBULATORY_CARE_PROVIDER_SITE_OTHER): Payer: 59 | Admitting: Family

## 2013-02-13 ENCOUNTER — Encounter: Payer: Self-pay | Admitting: Family

## 2013-02-13 ENCOUNTER — Encounter: Payer: 59 | Attending: Family | Admitting: *Deleted

## 2013-02-13 VITALS — BP 122/82 | HR 75 | Wt 223.0 lb

## 2013-02-13 DIAGNOSIS — E663 Overweight: Secondary | ICD-10-CM

## 2013-02-13 DIAGNOSIS — I10 Essential (primary) hypertension: Secondary | ICD-10-CM

## 2013-02-13 DIAGNOSIS — F988 Other specified behavioral and emotional disorders with onset usually occurring in childhood and adolescence: Secondary | ICD-10-CM

## 2013-02-13 DIAGNOSIS — Z713 Dietary counseling and surveillance: Secondary | ICD-10-CM | POA: Insufficient documentation

## 2013-02-13 MED ORDER — AMPHETAMINE-DEXTROAMPHET ER 20 MG PO CP24: 20.0000 mg | ORAL_CAPSULE | ORAL | Status: DC

## 2013-02-13 NOTE — Progress Notes (Signed)
Pre visit review using our clinic review tool, if applicable. No additional management support is needed unless otherwise documented below in the visit note. 

## 2013-02-13 NOTE — Progress Notes (Signed)
Medical Nutrition Therapy:  Appt start time: 1600 end time:  1630.  Assessment:  Primary concerns today: Candice Parker, a [employer] employee, is back for follow-up with weight management.  She states she is now sleeping better at night as she takes benadryl.  Therefore, she is no longer eating in the middle of the night.  She is also taking Adderral, not only during the week but over the weekend now as well, and it is still affecting her appetite and sometimes she can't even drink water.  She admits to making herself eat without biological hunger.  She states that she has tried to slow down when eating by placing down her fork between bites of food but that her food gets cold and that this has not been working well for her.  She requested a meal plan to follow since she knows that she needs to eat more frequently throughout the day to keep her metabolism going.     Preferred Learning Style:   Auditory   Learning Readiness:  Ready  MEDICATIONS: see list.    DIETARY INTAKE:  Usual eating pattern includes 2-3 meals and 0-2 snacks per day.  Everyday foods include protein, starch.  Avoided foods include none.    24-hr recall:  B ( AM): coffee, instant oatmeal  Snk ( AM): peach jello snack fruit cup  L ( PM): two tacos with beef, cheese, and lettuce  Snk ( PM): not really D ( PM): rotisserie chicken, string beans, yams, corn bread, small bites of each Snk ( PM): none Beverages: water with Crystal Light, but not enough  Usual physical activity: walks 15 minutes most days  Estimated energy needs: 1600-1800 calories  Nutritional Diagnosis:  NB-1.5 Disordered eating pattern As related to appetite suppression from medication.  As evidenced by meal skipping during the day and overconsumption at night.    Intervention:  Nutrition counseling provided. Patient suggested using an alarm on her phone for meal times and so I advised using an alarm set every 3 hours to take a break from what she is  doing and check in with herself and her hunger cues.  Declined the use of a meal plan, explaining that even with the best meal plan there will be days when she is hungrier or less hungry than the food amounts outlined and that she may have different cravings.  Instead, encouraged Candice Parker to choose a variety of foods from all of the food groups to get a spectrum of nutrients when she is able to eat.  Also advised choosing colorful foods which represent a variety of vitamins.  Outlined that eating smaller more frequent snacks throughout the day as she checks in with herself will help keep metabolism up, reduce overeating at night, and eventually help her hunger cues to return to her.  Throughout the day, check in mentally and rate hunger.  Try not to eat when ravenous, but instead when slightly hungry.  Then choose food(s) that will be satisfying regardless of nutritional content.  Normal hunger will occur every 3-5 hours.  Comforted Candice Parker that the first few weeks of doing this will be difficult.  Provided a snack ideas handout for her to take home.  Also stressed the importance of getting more fluids throughout the day to help her distinguish between feelings of thirst and hunger.   Teaching Method Utilized:  Visual Auditory  Barriers to learning/adherence to lifestyle change: none  Demonstrated degree of understanding via:  Teach Back   Monitoring/Evaluation:  Dietary  intake, exercise, and body weight prn.

## 2013-02-13 NOTE — Patient Instructions (Signed)
Sodium-Controlled Diet Sodium is a mineral. It is found in many foods. Sodium may be found naturally or added during the making of a food. The most common form of sodium is salt, which is made up of sodium and chloride. Reducing your sodium intake involves changing your eating habits. The following guidelines will help you reduce the sodium in your diet:  Stop using the salt shaker.  Use salt sparingly in cooking and baking.  Substitute with sodium-free seasonings and spices.  Do not use a salt substitute (potassium chloride) without your caregiver's permission.  Include a variety of fresh, unprocessed foods in your diet.  Limit the use of processed and convenience foods that are high in sodium. USE THE FOLLOWING FOODS SPARINGLY: Breads/Starches  Commercial bread stuffing, commercial pancake or waffle mixes, coating mixes. Waffles. Croutons. Prepared (boxed or frozen) potato, rice, or noodle mixes that contain salt or sodium. Salted French fries or hash browns. Salted popcorn, breads, crackers, chips, or snack foods. Vegetables  Vegetables canned with salt or prepared in cream, butter, or cheese sauces. Sauerkraut. Tomato or vegetable juices canned with salt.  Fresh vegetables are allowed if rinsed thoroughly. Fruit  Fruit is okay to eat. Meat and Meat Substitutes  Salted or smoked meats, such as bacon or Canadian bacon, chipped or corned beef, hot dogs, salt pork, luncheon meats, pastrami, ham, or sausage. Canned or smoked fish, poultry, or meat. Processed cheese or cheese spreads, blue or Roquefort cheese. Battered or frozen fish products. Prepared spaghetti sauce. Baked beans. Reuben sandwiches. Salted nuts. Caviar. Milk  Limit buttermilk to 1 cup per week. Soups and Combination Foods  Bouillon cubes, canned or dried soups, broth, consomm. Convenience (frozen or packaged) dinners with more than 600 mg sodium. Pot pies, pizza, Asian food, fast food cheeseburgers, and specialty  sandwiches. Desserts and Sweets  Regular (salted) desserts, pie, commercial fruit snack pies, commercial snack cakes, canned puddings.  Eat desserts and sweets in moderation. Fats and Oils  Gravy mixes or canned gravy. No more than 1 to 2 tbs of salad dressing. Chip dips.  Eat fats and oils in moderation. Beverages  See those listed under the vegetables and milk groups. Condiments  Ketchup, mustard, meat sauces, salsa, regular (salted) and lite soy sauce or mustard. Dill pickles, olives, meat tenderizer. Prepared horseradish or pickle relish. Dutch-processed cocoa. Baking powder or baking soda used medicinally. Worcestershire sauce. "Light" salt. Salt substitute, unless approved by your caregiver. Document Released: 07/17/2001 Document Revised: 04/19/2011 Document Reviewed: 02/17/2009 [employer] Patient Information 2014 [employer].  

## 2013-02-14 NOTE — Progress Notes (Signed)
Subjective:    Patient ID: Candice Parker, female    DOB: 1970/08/10, 43 y.o.   MRN: [id number]  HPI 43 year old [place] American female, nonsmoker is in for recheck of hypertension. Her last office visit we started lisinopril HCT 20/12.5 once daily. She is tolerating the medication well. Denies any concerns. She has a history of attention deficit disorder and needs a refill on her Adderall. Stable on that medication as well.   Review of Systems  Constitutional: Negative.   HENT: Negative.   Respiratory: Negative.   Cardiovascular: Negative.   Gastrointestinal: Negative.   Genitourinary: Negative.   Musculoskeletal: Negative.   Neurological: Negative.   Hematological: Negative.   Psychiatric/Behavioral: Negative.    Past Medical History  Diagnosis Date  . ASTHMA, INTRINSIC 01/29/2009  . Hypertension   . Overweight(278.02) 12/10/2010  . Breast lesion 09/29/2011    History   Social History  . Marital Status: Single    Spouse Name: N/A    Number of Children: 1  . Years of Education: N/A   Occupational History  .  [employer]    Office supervisor   Social History Main Topics  . Smoking status: Never Smoker   . Smokeless tobacco: Never Used  . Alcohol Use: Yes     Comment: Occasional  . Drug Use: No  . Sexual Activity: Not on file   Other Topics Concern  . Not on file   Social History Narrative  . No narrative on file    Past Surgical History  Procedure Laterality Date  . Elbow fracture surgery    . Inner ear surgery    . Total abdominal hysterectomy      Family History  Problem Relation Age of Onset  . Hypertension Mother   . Hyperlipidemia Mother   . Heart failure Father   . Kidney disease Father     failure  . Cancer Paternal Grandmother   . Obesity Other     No Known Allergies  Current Outpatient Prescriptions on File Prior to Visit  Medication Sig Dispense Refill  . acyclovir (ZOVIRAX) 5 % ointment Apply 1 application topically every 3  (three) hours.        Marland Kitchen acyclovir (ZOVIRAX) 800 MG tablet TAKE 1 TABLET (800 MG TOTAL) BY MOUTH 2 (TWO) TIMES DAILY.  10 tablet  2  . lisinopril-hydrochlorothiazide (ZESTORETIC) 20-12.5 MG per tablet Take 1 tablet by mouth daily.  30 tablet  3  . nebivolol (BYSTOLIC) 5 MG tablet Take 5 mg by mouth daily.      . Pediatric Multiple Vit-C-FA (MULTIVITAMIN ANIMAL SHAPES, WITH CA/FA,) WITH C & FA CHEW Chew 1 tablet by mouth daily.      Marland Kitchen albuterol (PROVENTIL HFA;VENTOLIN HFA) 108 (90 BASE) MCG/ACT inhaler Inhale 1 puff into the lungs every 6 (six) hours as needed for wheezing.  1 Inhaler  0  . amoxicillin (AMOXIL) 500 MG capsule Take 1 capsule (500 mg total) by mouth 3 (three) times daily.  30 capsule  0  . sulfamethoxazole-trimethoprim (BACTRIM DS) 800-160 MG per tablet Take 1 tablet by mouth 2 (two) times daily.  14 tablet  0   No current facility-administered medications on file prior to visit.    BP 122/82  Pulse 75  Wt 223 lb (101.152 kg)chart    Objective:   Physical Exam  Constitutional: She is oriented to person, place, and time. She appears well-developed and well-nourished.  Neck: Normal range of motion. Neck supple. No thyromegaly present.  Cardiovascular:  Normal rate, regular rhythm and normal heart sounds.   Pulmonary/Chest: Effort normal and breath sounds normal.  Abdominal: Soft. Bowel sounds are normal.  Musculoskeletal: Normal range of motion.  Neurological: She is alert and oriented to person, place, and time.  Skin: Skin is warm and dry.  Psychiatric: She has a normal mood and affect.          Assessment & Plan:  Assessment: 1. Hypertension 2. Attention deficit disorder  Plan: Continue current medications. Potassium diet. Patient the ostomy and questions or concerns. Recheck in 3 months and sooner as needed.

## 2013-04-19 ENCOUNTER — Encounter: Payer: Self-pay | Admitting: Family

## 2013-05-10 ENCOUNTER — Encounter: Payer: Self-pay | Admitting: Family

## 2013-05-10 ENCOUNTER — Ambulatory Visit (INDEPENDENT_AMBULATORY_CARE_PROVIDER_SITE_OTHER): Payer: 59 | Admitting: Family

## 2013-05-10 VITALS — BP 120/84 | HR 83 | Wt 221.0 lb

## 2013-05-10 DIAGNOSIS — I10 Essential (primary) hypertension: Secondary | ICD-10-CM

## 2013-05-10 DIAGNOSIS — F988 Other specified behavioral and emotional disorders with onset usually occurring in childhood and adolescence: Secondary | ICD-10-CM

## 2013-05-10 MED ORDER — AMPHETAMINE-DEXTROAMPHETAMINE 10 MG PO TABS: 10.0000 mg | ORAL_TABLET | Freq: Every day | ORAL | Status: DC

## 2013-05-10 MED ORDER — AMPHETAMINE-DEXTROAMPHET ER 20 MG PO CP24: 20.0000 mg | ORAL_CAPSULE | ORAL | Status: DC

## 2013-05-10 MED ORDER — LISINOPRIL-HYDROCHLOROTHIAZIDE 20-12.5 MG PO TABS: 1.0000 | ORAL_TABLET | Freq: Every day | ORAL | Status: DC

## 2013-05-10 NOTE — Patient Instructions (Signed)
Exercise to Stay Healthy Exercise helps you become and stay healthy. EXERCISE IDEAS AND TIPS Choose exercises that:  You enjoy.  Fit into your day. You do not need to exercise really hard to be healthy. You can do exercises at a slow or medium level and stay healthy. You can:  Stretch before and after working out.  Try yoga, Pilates, or tai chi.  Lift weights.  Walk fast, swim, jog, run, climb stairs, bicycle, dance, or rollerskate.  Take aerobic classes. Exercises that burn about 150 calories:  Running 1  miles in 15 minutes.  Playing volleyball for 45 to 60 minutes.  Washing and waxing a car for 45 to 60 minutes.  Playing touch football for 45 minutes.  Walking 1  miles in 35 minutes.  Pushing a stroller 1  miles in 30 minutes.  Playing basketball for 30 minutes.  Raking leaves for 30 minutes.  Bicycling 5 miles in 30 minutes.  Walking 2 miles in 30 minutes.  Dancing for 30 minutes.  Shoveling snow for 15 minutes.  Swimming laps for 20 minutes.  Walking up stairs for 15 minutes.  Bicycling 4 miles in 15 minutes.  Gardening for 30 to 45 minutes.  Jumping rope for 15 minutes.  Washing windows or floors for 45 to 60 minutes. Document Released: 02/27/2010 Document Revised: 04/19/2011 Document Reviewed: 02/27/2010 [facility] Patient Information 2014 [place], [place].

## 2013-05-10 NOTE — Progress Notes (Signed)
Pre visit review using our clinic review tool, if applicable. No additional management support is needed unless otherwise documented below in the visit note. 

## 2013-05-14 ENCOUNTER — Encounter: Payer: Self-pay | Admitting: Family

## 2013-05-14 NOTE — Progress Notes (Signed)
Subjective:    Patient ID: Candice Parker, female    DOB: [birth year], 43 y.o.   MRN: [id number]  HPI 43 year old [place] American female, and in today for recheck of hypertension and attention deficit disorder. Reports he is doing well. Tolerating medication well. At the end of exercise and losing some weight reduction.    Review of Systems  Constitutional: Negative.   HENT: Negative.   Respiratory: Negative.   Cardiovascular: Negative.   Gastrointestinal: Negative.   Endocrine: Negative.   Genitourinary: Negative.   Musculoskeletal: Negative.   Skin: Negative.   Hematological: Negative.   Psychiatric/Behavioral: Negative.    Past Medical History  Diagnosis Date  . ASTHMA, INTRINSIC 01/29/2009  . Hypertension   . Overweight 12/10/2010  . Breast lesion 09/29/2011    History   Social History  . Marital Status: Single    Spouse Name: N/A    Number of Children: 1  . Years of Education: N/A   Occupational History  .  [employer]    Office supervisor   Social History Main Topics  . Smoking status: Never Smoker   . Smokeless tobacco: Never Used  . Alcohol Use: Yes     Comment: Occasional  . Drug Use: No  . Sexual Activity: Not on file   Other Topics Concern  . Not on file   Social History Narrative  . No narrative on file    Past Surgical History  Procedure Laterality Date  . Elbow fracture surgery    . Inner ear surgery    . Total abdominal hysterectomy      Family History  Problem Relation Age of Onset  . Hypertension Mother   . Hyperlipidemia Mother   . Heart failure Father   . Kidney disease Father     failure  . Cancer Paternal Grandmother   . Obesity Other     No Known Allergies  Current Outpatient Prescriptions on File Prior to Visit  Medication Sig Dispense Refill  . acyclovir (ZOVIRAX) 5 % ointment Apply 1 application topically every 3 (three) hours.        Marland Kitchen acyclovir (ZOVIRAX) 800 MG tablet TAKE 1 TABLET (800 MG TOTAL) BY MOUTH 2  (TWO) TIMES DAILY.  10 tablet  2  . Pediatric Multiple Vit-C-FA (MULTIVITAMIN ANIMAL SHAPES, WITH CA/FA,) WITH C & FA CHEW Chew 1 tablet by mouth daily.      Marland Kitchen albuterol (PROVENTIL HFA;VENTOLIN HFA) 108 (90 BASE) MCG/ACT inhaler Inhale 1 puff into the lungs every 6 (six) hours as needed for wheezing.  1 Inhaler  0   No current facility-administered medications on file prior to visit.    BP 120/84  Pulse 83  Wt 221 lb (100.245 kg)  SpO2 98%chart    Objective:   Physical Exam  Constitutional: She is oriented to person, place, and time. She appears well-developed and well-nourished.  HENT:  Right Ear: External ear normal.  Left Ear: External ear normal.  Nose: Nose normal.  Mouth/Throat: Oropharynx is clear and moist.  Neck: Normal range of motion. Neck supple.  Cardiovascular: Normal rate, regular rhythm and normal heart sounds.   Pulmonary/Chest: Effort normal and breath sounds normal.  Abdominal: Soft. Bowel sounds are normal.  Musculoskeletal: Normal range of motion.  Neurological: She is alert and oriented to person, place, and time.  Skin: Skin is warm and dry.  Psychiatric: She has a normal mood and affect.          Assessment & Plan:  Lexine Baton  was seen today for follow-up.  Diagnoses and associated orders for this visit:  Unspecified essential hypertension  ADD (attention deficit disorder)  Other Orders - lisinopril-hydrochlorothiazide (ZESTORETIC) 20-12.5 MG per tablet; Take 1 tablet by mouth daily. - amphetamine-dextroamphetamine (ADDERALL) 10 MG tablet; Take 1 tablet (10 mg total) by mouth daily. - amphetamine-dextroamphetamine (ADDERALL XR) 20 MG 24 hr capsule; Take 1 capsule (20 mg total) by mouth every morning.   Call the office if symptoms worsen or persist. Recheck as scheduled and as needed.

## 2013-08-09 ENCOUNTER — Ambulatory Visit (INDEPENDENT_AMBULATORY_CARE_PROVIDER_SITE_OTHER): Payer: 59 | Admitting: Family

## 2013-08-09 ENCOUNTER — Ambulatory Visit: Payer: 59 | Admitting: Family

## 2013-08-09 ENCOUNTER — Encounter: Payer: Self-pay | Admitting: Family

## 2013-08-09 VITALS — BP 120/84 | Temp 98.0°F | Wt 216.0 lb

## 2013-08-09 DIAGNOSIS — J157 Pneumonia due to Mycoplasma pneumoniae: Secondary | ICD-10-CM

## 2013-08-09 MED ORDER — AZITHROMYCIN 250 MG PO TABS: 250.0000 mg | ORAL_TABLET | Freq: Every day | ORAL | Status: DC

## 2013-08-09 NOTE — Progress Notes (Signed)
Pre visit review using our clinic review tool, if applicable. No additional management support is needed unless otherwise documented below in the visit note. 

## 2013-08-09 NOTE — Patient Instructions (Signed)

## 2013-08-09 NOTE — Progress Notes (Signed)
Subjective:    Patient ID: Candice Parker, female    DOB: [birth year], 43 y.o.   MRN: [id number]  HPI 43 year old African American female, nonsmoker, is in today with complaints of a cough, shortness of breath, fatigue, nausea, aches x 2-3 weeks. Has not been taking her medication over-the-counter. Denies fever.  Review of Systems  Constitutional: Positive for fatigue.  HENT: Positive for congestion.   Cardiovascular: Negative.   Gastrointestinal: Negative.   Endocrine: Negative.   Musculoskeletal: Negative.   Skin: Negative.   Allergic/Immunologic: Negative.   Psychiatric/Behavioral: Negative.    Past Medical History  Diagnosis Date  . ASTHMA, INTRINSIC 01/29/2009  . Hypertension   . Overweight 12/10/2010  . Breast lesion 09/29/2011    History   Social History  . Marital Status: Single    Spouse Name: N/A    Number of Children: 1  . Years of Education: N/A   Occupational History  .  [employer]    Office supervisor   Social History Main Topics  . Smoking status: Never Smoker   . Smokeless tobacco: Never Used  . Alcohol Use: Yes     Comment: Occasional  . Drug Use: No  . Sexual Activity: Not on file   Other Topics Concern  . Not on file   Social History Narrative  . No narrative on file    Past Surgical History  Procedure Laterality Date  . Elbow fracture surgery    . Inner ear surgery    . Total abdominal hysterectomy      Family History  Problem Relation Age of Onset  . Hypertension Mother   . Hyperlipidemia Mother   . Heart failure Father   . Kidney disease Father     failure  . Cancer Paternal Grandmother   . Obesity Other     No Known Allergies  Current Outpatient Prescriptions on File Prior to Visit  Medication Sig Dispense Refill  . acyclovir (ZOVIRAX) 5 % ointment Apply 1 application topically every 3 (three) hours.        Marland Kitchen acyclovir (ZOVIRAX) 800 MG tablet TAKE 1 TABLET (800 MG TOTAL) BY MOUTH 2 (TWO) TIMES DAILY.  10 tablet  2  .  albuterol (PROVENTIL HFA;VENTOLIN HFA) 108 (90 BASE) MCG/ACT inhaler Inhale 1 puff into the lungs every 6 (six) hours as needed for wheezing.  1 Inhaler  0  . amphetamine-dextroamphetamine (ADDERALL XR) 20 MG 24 hr capsule Take 1 capsule (20 mg total) by mouth every morning.  90 capsule  0  . amphetamine-dextroamphetamine (ADDERALL) 10 MG tablet Take 1 tablet (10 mg total) by mouth daily.  90 tablet  0  . lisinopril-hydrochlorothiazide (ZESTORETIC) 20-12.5 MG per tablet Take 1 tablet by mouth daily.  90 tablet  1  . Pediatric Multiple Vit-C-FA (MULTIVITAMIN ANIMAL SHAPES, WITH CA/FA,) WITH C & FA CHEW Chew 1 tablet by mouth daily.       No current facility-administered medications on file prior to visit.    BP 120/84  Temp(Src) 98 F (36.7 C) (Oral)  Wt 216 lb (97.977 kg)chart     Objective:   Physical Exam  Constitutional: She is oriented to person, place, and time. She appears well-developed and well-nourished.  HENT:  Right Ear: External ear normal.  Left Ear: External ear normal.  Nose: Nose normal.  Mouth/Throat: Oropharynx is clear and moist.  Neck: Normal range of motion.  Cardiovascular: Normal rate, regular rhythm and normal heart sounds.   Pulmonary/Chest: Effort normal and breath sounds  normal.  Mild expiratory wheeze noted to the left apex.   Musculoskeletal: Normal range of motion.  Neurological: She is alert and oriented to person, place, and time.  Skin: Skin is warm and dry.  Psychiatric: She has a normal mood and affect.          Assessment & Plan:  Taiylor was seen today for headache, generalized body aches, fatigue, nausea and cough.  Diagnoses and associated orders for this visit:  Mycoplasma pneumonia  Other Orders - azithromycin (ZITHROMAX Z-PAK) 250 MG tablet; Take 1 tablet (250 mg total) by mouth daily. 2 tabs today, then 1 tab a day x 4 more days.   Call the office with any questions or concerns. Recheck as needed.

## 2013-08-13 ENCOUNTER — Ambulatory Visit (HOSPITAL_COMMUNITY)
Admission: RE | Admit: 2013-08-13 | Discharge: 2013-08-13 | Disposition: A | Discharge status: Home / Self Care | Payer: 59 | Source: Ambulatory Visit | Attending: Family | Admitting: Family

## 2013-08-13 ENCOUNTER — Encounter (HOSPITAL_COMMUNITY): Payer: Self-pay | Admitting: Emergency Medicine

## 2013-08-13 ENCOUNTER — Other Ambulatory Visit: Payer: Self-pay | Admitting: Family

## 2013-08-13 ENCOUNTER — Other Ambulatory Visit: Payer: Self-pay

## 2013-08-13 ENCOUNTER — Telehealth: Payer: Self-pay | Admitting: Family

## 2013-08-13 ENCOUNTER — Encounter: Payer: Self-pay | Admitting: Family

## 2013-08-13 ENCOUNTER — Emergency Department (HOSPITAL_COMMUNITY)
Admission: EM | Admit: 2013-08-13 | Discharge: 2013-08-13 | Disposition: A | Discharge status: Home / Self Care | Payer: 59 | Attending: Emergency Medicine | Admitting: Emergency Medicine

## 2013-08-13 ENCOUNTER — Emergency Department (HOSPITAL_COMMUNITY): Payer: 59

## 2013-08-13 DIAGNOSIS — R7989 Other specified abnormal findings of blood chemistry: Secondary | ICD-10-CM

## 2013-08-13 DIAGNOSIS — R0602 Shortness of breath: Secondary | ICD-10-CM

## 2013-08-13 DIAGNOSIS — R791 Abnormal coagulation profile: Secondary | ICD-10-CM | POA: Insufficient documentation

## 2013-08-13 DIAGNOSIS — Z8701 Personal history of pneumonia (recurrent): Secondary | ICD-10-CM | POA: Insufficient documentation

## 2013-08-13 DIAGNOSIS — Z8639 Personal history of other endocrine, nutritional and metabolic disease: Secondary | ICD-10-CM | POA: Insufficient documentation

## 2013-08-13 DIAGNOSIS — R0600 Dyspnea, unspecified: Secondary | ICD-10-CM

## 2013-08-13 DIAGNOSIS — Z8742 Personal history of other diseases of the female genital tract: Secondary | ICD-10-CM | POA: Insufficient documentation

## 2013-08-13 DIAGNOSIS — Z862 Personal history of diseases of the blood and blood-forming organs and certain disorders involving the immune mechanism: Secondary | ICD-10-CM | POA: Insufficient documentation

## 2013-08-13 DIAGNOSIS — I10 Essential (primary) hypertension: Secondary | ICD-10-CM | POA: Insufficient documentation

## 2013-08-13 DIAGNOSIS — Z79899 Other long term (current) drug therapy: Secondary | ICD-10-CM | POA: Insufficient documentation

## 2013-08-13 DIAGNOSIS — M79609 Pain in unspecified limb: Secondary | ICD-10-CM

## 2013-08-13 DIAGNOSIS — J45901 Unspecified asthma with (acute) exacerbation: Secondary | ICD-10-CM | POA: Insufficient documentation

## 2013-08-13 LAB — BASIC METABOLIC PANEL
Anion gap: 12 (ref 5–15)
BUN: 13 mg/dL (ref 6–23)
CALCIUM: 8.9 mg/dL (ref 8.4–10.5)
CO2: 26 meq/L (ref 19–32)
CREATININE: 0.95 mg/dL (ref 0.50–1.10)
Chloride: 99 mEq/L (ref 96–112)
GFR calc Af Amer: 85 mL/min — ABNORMAL LOW (ref >90)
GFR calc non Af Amer: 73 mL/min — ABNORMAL LOW (ref >90)
Glucose, Bld: 89 mg/dL (ref 70–99)
Potassium: 3.4 mEq/L — ABNORMAL LOW (ref 3.7–5.3)
Sodium: 137 mEq/L (ref 137–147)

## 2013-08-13 LAB — CBC WITH DIFFERENTIAL/PLATELET
BASOS ABS: 0 10*3/uL (ref 0.0–0.1)
Basophils Relative: 0 % (ref 0–1)
EOS PCT: 3 % (ref 0–5)
Eosinophils Absolute: 0.3 10*3/uL (ref 0.0–0.7)
HCT: 37.6 % (ref 36.0–46.0)
Hemoglobin: 13.1 g/dL (ref 12.0–15.0)
Lymphocytes Relative: 34 % (ref 12–46)
Lymphs Abs: 3.6 10*3/uL (ref 0.7–4.0)
MCH: 30.4 pg (ref 26.0–34.0)
MCHC: 34.8 g/dL (ref 30.0–36.0)
MCV: 87.2 fL (ref 78.0–100.0)
MONO ABS: 0.9 10*3/uL (ref 0.1–1.0)
Monocytes Relative: 9 % (ref 3–12)
Neutro Abs: 5.7 10*3/uL (ref 1.7–7.7)
Neutrophils Relative %: 54 % (ref 43–77)
Platelets: 289 10*3/uL (ref 150–400)
RBC: 4.31 MIL/uL (ref 3.87–5.11)
RDW: 12.6 % (ref 11.5–15.5)
WBC: 10.5 10*3/uL (ref 4.0–10.5)

## 2013-08-13 LAB — D-DIMER, QUANTITATIVE: D-Dimer, Quant: 1.83 ug/mL-FEU — ABNORMAL HIGH (ref 0.00–0.48)

## 2013-08-13 LAB — TROPONIN I: Troponin I: <0.3 ng/mL (ref <0.30)

## 2013-08-13 MED ORDER — IOHEXOL 350 MG/ML SOLN
80.0000 mL | Freq: Once | INTRAVENOUS | Status: AC | PRN
  Administered 2013-08-13: 67 mL via INTRAVENOUS

## 2013-08-13 NOTE — Progress Notes (Signed)
VASCULAR LAB PRELIMINARY  PRELIMINARY  PRELIMINARY  PRELIMINARY  Left lower extremity venous Doppler completed.    Preliminary report:  There is no DVT or SVT noted in the left lower extremity.  Marlene Beidler, RVT 08/13/2013, 7:33 PM

## 2013-08-13 NOTE — ED Provider Notes (Signed)
CSN: [id number]     Arrival date & time 08/13/13  1746 History   First MD Initiated Contact with Patient 08/13/13 1825     Chief Complaint  Patient presents with  . Shortness of Breath     (Consider location/radiation/quality/duration/timing/severity/associated sxs/prior Treatment) HPI Comments: Patient sent from PCPs office with elevated d-dimer. She has had shortness of breath for the past 2 weeks and treated for pneumonia 5 days ago with azithromycin which he completed today. She endorses dry cough, body aches, chills. Denies fever, chest pain, abdominal pain nausea vomiting. She is intermittent pain behind her left knee that has been going on for several months. No history of blood clots. No recent travel. She has a history of asthma, hypertension and obesity. She does not smoke. Denies any heart problems.  The history is provided by the patient.    Past Medical History  Diagnosis Date  . ASTHMA, INTRINSIC 01/29/2009  . Hypertension   . Overweight(278.02) 12/10/2010  . Breast lesion 09/29/2011   Past Surgical History  Procedure Laterality Date  . Elbow fracture surgery    . Inner ear surgery    . Total abdominal hysterectomy     Family History  Problem Relation Age of Onset  . Hypertension Mother   . Hyperlipidemia Mother   . Heart failure Father   . Kidney disease Father     failure  . Cancer Paternal Grandmother   . Obesity Other    History  Substance Use Topics  . Smoking status: Never Smoker   . Smokeless tobacco: Never Used  . Alcohol Use: Yes     Comment: Occasional   OB History   Grav Para Term Preterm Abortions TAB SAB Ect Mult Living                 Review of Systems  Constitutional: Negative for fever, activity change and appetite change.  HENT: Positive for congestion and rhinorrhea.   Respiratory: Positive for cough and shortness of breath. Negative for chest tightness.   Cardiovascular: Negative for chest pain.  Gastrointestinal: Negative for  nausea, vomiting and abdominal pain.  Genitourinary: Negative for dysuria, hematuria, vaginal bleeding and vaginal discharge.  Musculoskeletal: Negative for arthralgias, back pain and myalgias.  Skin: Negative for rash.  Neurological: Negative for dizziness, weakness and headaches.  A complete 10 system review of systems was obtained and all systems are negative except as noted in the HPI and PMH.      Allergies  Review of patient's allergies indicates no known allergies.  Home Medications   Prior to Admission medications   Medication Sig Start Date End Date Taking? Authorizing Provider  albuterol (PROVENTIL HFA;VENTOLIN HFA) 108 (90 BASE) MCG/ACT inhaler Inhale 1-2 puffs into the lungs every 6 (six) hours as needed for wheezing or shortness of breath.   Yes Historical Provider, MD  amphetamine-dextroamphetamine (ADDERALL XR) 20 MG 24 hr capsule Take 1 capsule (20 mg total) by mouth every morning. 05/10/13  Yes Timoteo Gaul, FNP  amphetamine-dextroamphetamine (ADDERALL) 10 MG tablet Take 1 tablet (10 mg total) by mouth daily. 05/10/13  Yes Timoteo Gaul, FNP  ibuprofen (ADVIL,MOTRIN) 200 MG tablet Take 400 mg by mouth every 6 (six) hours as needed.   Yes Historical Provider, MD  lisinopril-hydrochlorothiazide (ZESTORETIC) 20-12.5 MG per tablet Take 1 tablet by mouth daily. 05/10/13  Yes Timoteo Gaul, FNP  Pediatric Multiple Vit-C-FA (MULTIVITAMIN ANIMAL SHAPES, WITH CA/FA,) WITH C & FA CHEW Chew 1 tablet by mouth daily.  Yes Historical Provider, MD   BP 130/79  Pulse 84  Temp(Src) 98.1 F (36.7 C) (Oral)  Resp 20  Ht 5\' 8"  (1.727 m)  Wt 214 lb (97.07 kg)  BMI 32.55 kg/m2  SpO2 100% Physical Exam  Nursing note and vitals reviewed. Constitutional: She is oriented to person, place, and time. She appears well-developed and well-nourished. No distress.  HENT:  Head: Normocephalic and atraumatic.  Mouth/Throat: Oropharynx is clear and moist.  Eyes: Conjunctivae and EOM  are normal. Pupils are equal, round, and reactive to light.  Neck: Normal range of motion. Neck supple.  Cardiovascular: Normal rate, regular rhythm and normal heart sounds.   No murmur heard. Pulmonary/Chest: Effort normal and breath sounds normal. No respiratory distress.  Abdominal: Soft. There is no tenderness. There is no rebound and no guarding.  Musculoskeletal: Normal range of motion. She exhibits edema. She exhibits no tenderness.  TTP left calf without asymmetry.  Neurological: She is alert and oriented to person, place, and time. No cranial nerve deficit. She exhibits normal muscle tone. Coordination normal.  Skin: Skin is warm. No rash noted.    ED Course  Procedures (including critical care time) Labs Review Labs Reviewed  BASIC METABOLIC PANEL - Abnormal; Notable for the following:    Potassium 3.4 (*)    GFR calc non Af Amer 73 (*)    GFR calc Af Amer 85 (*)    All other components within normal limits  CBC WITH DIFFERENTIAL  TROPONIN I    Imaging Review Dg Chest 2 View  08/13/2013   CLINICAL DATA:  Shortness of Breath  EXAM: CHEST  2 VIEW  COMPARISON:  April 07, 2011.  FINDINGS: Lungs are clear. Heart size and pulmonary vascularity are normal. No adenopathy. No bone lesions.  IMPRESSION: No edema or consolidation.   Electronically Signed   By: Lowella Grip M.D.   On: 08/13/2013 09:24   Ct Angio Chest Pe W/cm &/or Wo Cm  08/13/2013   CLINICAL DATA:  Shortness of breath.  Positive D-dimer.  EXAM: CT ANGIOGRAPHY CHEST WITH CONTRAST  TECHNIQUE: Multidetector CT imaging of the chest was performed using the standard protocol during bolus administration of intravenous contrast. Multiplanar CT image reconstructions and MIPs were obtained to evaluate the vascular anatomy.  CONTRAST:  43mL OMNIPAQUE IOHEXOL 350 MG/ML SOLN  COMPARISON:  None.  FINDINGS: Satisfactory opacification of pulmonary arteries noted, and no pulmonary emboli identified. No evidence of thoracic aortic  dissection or aneurysm. No evidence of mediastinal hematoma or mass. No lymphadenopathy seen within the thorax.  No evidence of pleural or pericardial effusion. No evidence of pulmonary infiltrate or central endobronchial obstruction. No mass or lymphadenopathy identified. Azygos fissure incidentally noted. No suspicious bone lesions identified.  Review of the MIP images confirms the above findings.  IMPRESSION: Negative. No evidence of pulmonary embolism or other active disease.   Electronically Signed   By: Earle Gell M.D.   On: 08/13/2013 20:55     EKG Interpretation None      MDM   Final diagnoses:  Elevated d-dimer   2 week history of SOB. Recently treated for PNA.  Sent from PCP with elevated d-dimer. Denies chest pain or fever.  LLE doppler negative for DVT.  CT negative for PE or other pathology. No pneumonia. No evidence of heart failure.  Patient ambulatory without desaturation. Patient stable for followup with PCP.   Date: 08/13/2013  Rate: 80  Rhythm: normal sinus rhythm  QRS Axis: normal  Intervals:  normal  ST/T Wave abnormalities: normal  Conduction Disutrbances:none  Narrative Interpretation:   Old EKG Reviewed: none available     Ezequiel Essex, MD 08/14/13 0010

## 2013-08-13 NOTE — Telephone Encounter (Signed)
Pt notified, will go now for CXR.

## 2013-08-13 NOTE — Telephone Encounter (Signed)
Have CXR done at [facility], I think that's closer.

## 2013-08-13 NOTE — ED Notes (Signed)
Pt ambulated in hall with pulse ox, O2 sats remained between 98-99% on RA. Pt stated she felt back to normal. Pt tolerated well.  MD notified.

## 2013-08-13 NOTE — Telephone Encounter (Signed)
Pt calling to report she is still experiencing sob, she will complete course of antibiotic today but still does not feel better.

## 2013-08-13 NOTE — Discharge Instructions (Signed)
Shortness of Breath There is no evidence of pneumonia, heart attack, or blood clot in the lung.  Follow up with your doctor. Return to the ED if you develop new or worsening symptoms. Shortness of breath means you have trouble breathing. It could also mean that you have a medical problem. You should get immediate medical care for shortness of breath. CAUSES   Not enough oxygen in the air such as with high altitudes or a smoke-filled room.  Certain lung diseases, infections, or problems.  Heart disease or conditions, such as angina or heart failure.  Low red blood cells (anemia).  Poor physical fitness, which can cause shortness of breath when you exercise.  Chest or back injuries or stiffness.  Being overweight.  Smoking.  Anxiety, which can make you feel like you are not getting enough air. DIAGNOSIS  Serious medical problems can often be found during your physical exam. Tests may also be done to determine why you are having shortness of breath. Tests may include:  Chest X-rays.  Lung function tests.  Blood tests.  An electrocardiogram (ECG).  An ambulatory electrocardiogram. An ambulatory ECG records your heartbeat patterns over a 24-hour period.  Exercise testing.  A transthoracic echocardiogram (TTE). During echocardiography, sound waves are used to evaluate how blood flows through your heart.  A transesophageal echocardiogram (TEE).  Imaging scans. Your health care provider may not be able to find a cause for your shortness of breath after your exam. In this case, it is important to have a follow-up exam with your health care provider as directed.  TREATMENT  Treatment for shortness of breath depends on the cause of your symptoms and can vary greatly. HOME CARE INSTRUCTIONS   Do not smoke. Smoking is a common cause of shortness of breath. If you smoke, ask for help to quit.  Avoid being around chemicals or things that may bother your breathing, such as paint  fumes and dust.  Rest as needed. Slowly resume your usual activities.  If medicines were prescribed, take them as directed for the full length of time directed. This includes oxygen and any inhaled medicines.  Keep all follow-up appointments as directed by your health care provider. SEEK MEDICAL CARE IF:   Your condition does not improve in the time expected.  You have a hard time doing your normal activities even with rest.  You have any new symptoms. SEEK IMMEDIATE MEDICAL CARE IF:   Your shortness of breath gets worse.  You feel light-headed, faint, or develop a cough not controlled with medicines.  You start coughing up blood.  You have pain with breathing.  You have chest pain or pain in your arms, shoulders, or abdomen.  You have a fever.  You are unable to walk up stairs or exercise the way you normally do. MAKE SURE YOU:  Understand these instructions.  Will watch your condition.  Will get help right away if you are not doing well or get worse. Document Released: 10/20/2000 Document Revised: 01/30/2013 Document Reviewed: 04/12/2011 [facility] Patient Information 2015 [place], [place]. This information is not intended to replace advice given to you by your health care provider. Make sure you discuss any questions you have with your health care provider.

## 2013-08-13 NOTE — ED Notes (Signed)
Pt in from her doctors office, went there for follow up after dx with pneumonia last week and states symptoms were not improved, had a positive d-dimer in office today

## 2013-08-14 ENCOUNTER — Encounter: Payer: Self-pay | Admitting: Family

## 2013-09-07 ENCOUNTER — Other Ambulatory Visit: Payer: Self-pay | Admitting: Family

## 2013-09-10 MED ORDER — AMPHETAMINE-DEXTROAMPHET ER 20 MG PO CP24: 20.0000 mg | ORAL_CAPSULE | ORAL | Status: DC

## 2013-10-29 ENCOUNTER — Encounter: Payer: Self-pay | Admitting: Family

## 2013-10-31 ENCOUNTER — Encounter: Payer: Self-pay | Admitting: Family

## 2013-10-31 ENCOUNTER — Ambulatory Visit (INDEPENDENT_AMBULATORY_CARE_PROVIDER_SITE_OTHER): Payer: 59 | Admitting: Family

## 2013-10-31 VITALS — BP 124/76 | HR 67 | Temp 98.0°F | Wt 222.0 lb

## 2013-10-31 DIAGNOSIS — R200 Anesthesia of skin: Secondary | ICD-10-CM

## 2013-10-31 DIAGNOSIS — E876 Hypokalemia: Secondary | ICD-10-CM

## 2013-10-31 DIAGNOSIS — R209 Unspecified disturbances of skin sensation: Secondary | ICD-10-CM

## 2013-10-31 LAB — COMPREHENSIVE METABOLIC PANEL
ALT: 17 U/L (ref 0–35)
AST: 21 U/L (ref 0–37)
Albumin: 3.6 g/dL (ref 3.5–5.2)
Alkaline Phosphatase: 64 U/L (ref 39–117)
BUN: 8 mg/dL (ref 6–23)
CALCIUM: 9.1 mg/dL (ref 8.4–10.5)
CHLORIDE: 102 meq/L (ref 96–112)
CO2: 29 meq/L (ref 19–32)
Creatinine, Ser: 0.8 mg/dL (ref 0.4–1.2)
GFR: 97.29 mL/min (ref >60.00)
Glucose, Bld: 98 mg/dL (ref 70–99)
Potassium: 3.4 mEq/L — ABNORMAL LOW (ref 3.5–5.1)
Sodium: 138 mEq/L (ref 135–145)
Total Bilirubin: 0.5 mg/dL (ref 0.2–1.2)
Total Protein: 7.6 g/dL (ref 6.0–8.3)

## 2013-10-31 LAB — CBC WITH DIFFERENTIAL/PLATELET
Basophils Absolute: 0 10*3/uL (ref 0.0–0.1)
Basophils Relative: 0.6 % (ref 0.0–3.0)
EOS ABS: 0.4 10*3/uL (ref 0.0–0.7)
Eosinophils Relative: 5.1 % — ABNORMAL HIGH (ref 0.0–5.0)
HEMATOCRIT: 38.5 % (ref 36.0–46.0)
HEMOGLOBIN: 12.9 g/dL (ref 12.0–15.0)
Lymphocytes Relative: 36.2 % (ref 12.0–46.0)
Lymphs Abs: 2.6 10*3/uL (ref 0.7–4.0)
MCHC: 33.4 g/dL (ref 30.0–36.0)
MCV: 91.1 fl (ref 78.0–100.0)
MONO ABS: 0.7 10*3/uL (ref 0.1–1.0)
Monocytes Relative: 9.2 % (ref 3.0–12.0)
NEUTROS ABS: 3.6 10*3/uL (ref 1.4–7.7)
Neutrophils Relative %: 48.9 % (ref 43.0–77.0)
Platelets: 290 10*3/uL (ref 150.0–400.0)
RBC: 4.23 Mil/uL (ref 3.87–5.11)
RDW: 13.3 % (ref 11.5–15.5)
WBC: 7.3 10*3/uL (ref 4.0–10.5)

## 2013-10-31 LAB — TSH: TSH: 0.29 u[IU]/mL — ABNORMAL LOW (ref 0.35–4.50)

## 2013-10-31 NOTE — Progress Notes (Signed)
Subjective:    Patient ID: Candice Parker, female    DOB: 02-Jul-1970, 43 y.o.   MRN: [id number]  HPI 43 year old African American female, nonsmoker with a history of attention deficit disorder and hypertension is in today with right sided facial tingling x2 months off and on but worsening in frequency over the last 2 weeks. This sensation occurs suddenly and may last several minutes. Reports have been increased blurred vision that is typically related to going from the computer screen to look away. Also has concerns of forgetting words and forming sentences. Reports increased stress at work but does not feel like she is overly chest. Son is away at college which has been an adjustment.    Review of Systems  Constitutional: Negative.   HENT: Negative.   Respiratory: Negative.   Cardiovascular: Negative.   Gastrointestinal: Negative.   Endocrine: Negative.   Genitourinary: Negative.   Musculoskeletal: Negative.   Skin: Negative.   Allergic/Immunologic: Negative.   Neurological: Negative.   Hematological: Negative.   Psychiatric/Behavioral: Negative.    Past Medical History  Diagnosis Date  . ASTHMA, INTRINSIC 01/29/2009  . Hypertension   . Overweight(278.02) 12/10/2010  . Breast lesion 09/29/2011    History   Social History  . Marital Status: Single    Spouse Name: N/A    Number of Children: 1  . Years of Education: N/A   Occupational History  .  [employer]    Office supervisor   Social History Main Topics  . Smoking status: Never Smoker   . Smokeless tobacco: Never Used  . Alcohol Use: Yes     Comment: Occasional  . Drug Use: No  . Sexual Activity: Not on file   Other Topics Concern  . Not on file   Social History Narrative  . No narrative on file    Past Surgical History  Procedure Laterality Date  . Elbow fracture surgery    . Inner ear surgery    . Total abdominal hysterectomy      Family History  Problem Relation Age of Onset  . Hypertension  Mother   . Hyperlipidemia Mother   . Heart failure Father   . Kidney disease Father     failure  . Cancer Paternal Grandmother   . Obesity Other     No Known Allergies  Current Outpatient Prescriptions on File Prior to Visit  Medication Sig Dispense Refill  . albuterol (PROVENTIL HFA;VENTOLIN HFA) 108 (90 BASE) MCG/ACT inhaler Inhale 1-2 puffs into the lungs every 6 (six) hours as needed for wheezing or shortness of breath.      . amphetamine-dextroamphetamine (ADDERALL XR) 20 MG 24 hr capsule Take 1 capsule (20 mg total) by mouth every morning.  90 capsule  0  . amphetamine-dextroamphetamine (ADDERALL) 10 MG tablet Take 1 tablet (10 mg total) by mouth daily.  90 tablet  0  . ibuprofen (ADVIL,MOTRIN) 200 MG tablet Take 400 mg by mouth every 6 (six) hours as needed.      Marland Kitchen lisinopril-hydrochlorothiazide (ZESTORETIC) 20-12.5 MG per tablet Take 1 tablet by mouth daily.  90 tablet  1  . Pediatric Multiple Vit-C-FA (MULTIVITAMIN ANIMAL SHAPES, WITH CA/FA,) WITH C & FA CHEW Chew 1 tablet by mouth daily.       No current facility-administered medications on file prior to visit.    BP 124/76  Pulse 67  Temp(Src) 98 F (36.7 C) (Oral)  Wt 222 lb (100.699 kg)chart    Objective:   Physical Exam  Constitutional: She is oriented to person, place, and time. She appears well-developed and well-nourished.  Neck: Normal range of motion. Neck supple.  Cardiovascular: Normal rate, regular rhythm and normal heart sounds.   Pulmonary/Chest: Effort normal and breath sounds normal.  Abdominal: Soft. Bowel sounds are normal.  Neurological: She is alert and oriented to person, place, and time. She displays normal reflexes. No cranial nerve deficit. She exhibits normal muscle tone. Coordination normal.  Skin: Skin is warm and dry.  Psychiatric: She has a normal mood and affect.          Assessment & Plan:  Dorotha was seen today for tingling.  Diagnoses and associated orders for this  visit:  Right facial numbness - CMP - ANA - CBC with Differential - TSH - Ambulatory referral to Neurology  Hypokalemia - CMP - ANA - CBC with Differential - TSH - Ambulatory referral to Neurology    Labs sent today. Consider MRI of the brain. Refer to neurology. Followup pending results.

## 2013-10-31 NOTE — Patient Instructions (Signed)
Potassium Content of Foods  Potassium is a mineral found in many foods and drinks. It helps keep fluids and minerals balanced in your body and affects how steadily your heart beats. Potassium also helps control your blood pressure and keep your muscles and nervous system healthy.  Certain health conditions and medicines may change the balance of potassium in your body. When this happens, you can help balance your level of potassium through the foods that you do or do not eat. Your health care provider or dietitian may recommend an amount of potassium that you should have each day. The following lists of foods provide the amount of potassium (in parentheses) per serving in each item.  HIGH IN POTASSIUM   The following foods and beverages have 200 mg or more of potassium per serving:  · Apricots, 2 raw or 5 dry (200 mg).  · Artichoke, 1 medium (345 mg).  · Avocado, raw,  ¼ each (245 mg).  · Banana, 1 medium (425 mg).  · Beans, lima, or baked beans, canned, ½ cup (280 mg).  · Beans, white, canned, ½ cup (595 mg).  · Beef roast, 3 oz (320 mg).  · Beef, ground, 3 oz (270 mg).  · Beets, raw or cooked, ½ cup (260 mg).  · Bran muffin, 2 oz (300 mg).  · Broccoli, ½ cup (230 mg).  · Brussels sprouts, ½ cup (250 mg).  · Cantaloupe, ½ cup (215 mg).  · Cereal, 100% bran, ½ cup (200-400 mg).  · Cheeseburger, single, fast food, 1 each (225-400 mg).  · Chicken, 3 oz (220 mg).  · Clams, canned, 3 oz (535 mg).  · Crab, 3 oz (225 mg).  · Dates, 5 each (270 mg).  · Dried beans and peas, ½ cup (300-475 mg).  · Figs, dried, 2 each (260 mg).  · Fish: halibut, tuna, cod, snapper, 3 oz (480 mg).  · Fish: salmon, haddock, swordfish, perch, 3 oz (300 mg).  · Fish, tuna, canned 3 oz (200 mg).  · French fries, fast food, 3 oz (470 mg).  · Granola with fruit and nuts, ½ cup (200 mg).  · Grapefruit juice, ½ cup (200 mg).  · Greens, beet, ½ cup (655 mg).  · Honeydew melon, ½ cup (200 mg).  · Kale, raw, 1 cup (300 mg).  · Kiwi, 1 medium (240  mg).  · Kohlrabi, rutabaga, parsnips, ½ cup (280 mg).  · Lentils, ½ cup (365 mg).  · Mango, 1 each (325 mg).  · Milk, chocolate, 1 cup (420 mg).  · Milk: nonfat, low-fat, whole, buttermilk, 1 cup (350-380 mg).  · Molasses, 1 Tbsp (295 mg).  · Mushrooms, ½ cup (280) mg.  · Nectarine, 1 each (275 mg).  · Nuts: almonds, peanuts, hazelnuts, [place], cashew, mixed, 1 oz (200 mg).  · Nuts, pistachios, 1 oz (295 mg).  · Orange, 1 each (240 mg).  · Orange juice, ½ cup (235 mg).  · Papaya, medium, ½ fruit (390 mg).  · Peanut butter, chunky, 2 Tbsp (240 mg).  · Peanut butter, smooth, 2 Tbsp (210 mg).  · Pear, 1 medium (200 mg).  · Pomegranate, 1 whole (400 mg).  · Pomegranate juice, ½ cup (215 mg).  · Pork, 3 oz (350 mg).  · Potato chips, salted, 1 oz (465 mg).  · Potato, baked with skin, 1 medium (925 mg).  · Potatoes, boiled, ½ cup (255 mg).  · Potatoes, mashed, ½ cup (330 mg).  · Prune juice, ½ cup (  370 mg).  · Prunes, 5 each (305 mg).  · Pudding, chocolate, ½ cup (230 mg).  · Pumpkin, canned, ½ cup (250 mg).  · Raisins, seedless, ¼ cup (270 mg).  · Seeds, sunflower or pumpkin, 1 oz (240 mg).  · Soy milk, 1 cup (300 mg).  · Spinach, ½ cup (420 mg).  · Spinach, canned, ½ cup (370 mg).  · Sweet potato, baked with skin, 1 medium (450 mg).  · Swiss chard, ½ cup (480 mg).  · Tomato or vegetable juice, ½ cup (275 mg).  · Tomato sauce or puree, ½ cup (400-550 mg).  · Tomato, raw, 1 medium (290 mg).  · Tomatoes, canned, ½ cup (200-300 mg).  · Turkey, 3 oz (250 mg).  · Wheat germ, 1 oz (250 mg).  · Winter squash, ½ cup (250 mg).  · Yogurt, plain or fruited, 6 oz (260-435 mg).  · Zucchini, ½ cup (220 mg).  MODERATE IN POTASSIUM  The following foods and beverages have 50-200 mg of potassium per serving:  · Apple, 1 each (150 mg).  · Apple juice, ½ cup (150 mg).  · Applesauce, ½ cup (90 mg).  · Apricot nectar, ½ cup (140 mg).  · Asparagus, small spears, ½ cup or 6 spears (155 mg).  · Bagel, cinnamon raisin, 1 each (130 mg).  · Bagel,  egg or plain, 4 in., 1 each (70 mg).  · Beans, green, ½ cup (90 mg).  · Beans, yellow, ½ cup (190 mg).  · Beer, regular, 12 oz (100 mg).  · Beets, canned, ½ cup (125 mg).  · Blackberries, ½ cup (115 mg).  · Blueberries, ½ cup (60 mg).  · Bread, whole wheat, 1 slice (70 mg).  · Broccoli, raw, ½ cup (145 mg).  · Cabbage, ½ cup (150 mg).  · Carrots, cooked or raw, ½ cup (180 mg).  · Cauliflower, raw, ½ cup (150 mg).  · Celery, raw, ½ cup (155 mg).  · Cereal, bran flakes, ½cup (120-150 mg).  · Cheese, cottage, ½ cup (110 mg).  · Cherries, 10 each (150 mg).  · Chocolate, 1½ oz bar (165 mg).  · Coffee, brewed 6 oz (90 mg).  · Corn, ½ cup or 1 ear (195 mg).  · Cucumbers, ½ cup (80 mg).  · Egg, large, 1 each (60 mg).  · Eggplant, ½ cup (60 mg).  · Endive, raw, ½cup (80 mg).  · English muffin, 1 each (65 mg).  · Fish, orange roughy, 3 oz (150 mg).  · Frankfurter, beef or pork, 1 each (75 mg).  · Fruit cocktail, ½ cup (115 mg).  · Grape juice, ½ cup (170 mg).  · Grapefruit, ½ fruit (175 mg).  · Grapes, ½ cup (155 mg).  · Greens: kale, turnip, collard, ½ cup (110-150 mg).  · Ice cream or frozen yogurt, chocolate, ½ cup (175 mg).  · Ice cream or frozen yogurt, vanilla, ½ cup (120-150 mg).  · Lemons, limes, 1 each (80 mg).  · Lettuce, all types, 1 cup (100 mg).  · Mixed vegetables, ½ cup (150 mg).  · Mushrooms, raw, ½ cup (110 mg).  · Nuts: walnuts, pecans, or macadamia, 1 oz (125 mg).  · Oatmeal, ½ cup (80 mg).  · Okra, ½ cup (110 mg).  · Onions, raw, ½ cup (120 mg).  · Peach, 1 each (185 mg).  · Peaches, canned, ½ cup (120 mg).  · Pears, canned, ½ cup (120 mg).  · Peas, green,   frozen, ½ cup (90 mg).  · Peppers, green, ½ cup (130 mg).  · Peppers, red, ½ cup (160 mg).  · Pineapple juice, ½ cup (165 mg).  · Pineapple, fresh or canned, ½ cup (100 mg).  · Plums, 1 each (105 mg).  · Pudding, vanilla, ½ cup (150 mg).  · Raspberries, ½ cup (90 mg).  · Rhubarb, ½ cup (115 mg).  · Rice, wild, ½ cup (80 mg).  · Shrimp, 3 oz (155  mg).  · Spinach, raw, 1 cup (170 mg).  · Strawberries, ½ cup (125 mg).  · Summer squash ½ cup (175-200 mg).  · Swiss chard, raw, 1 cup (135 mg).  · Tangerines, 1 each (140 mg).  · Tea, brewed, 6 oz (65 mg).  · Turnips, ½ cup (140 mg).  · Watermelon, ½ cup (85 mg).  · Wine, red, table, 5 oz (180 mg).  · Wine, white, table, 5 oz (100 mg).  LOW IN POTASSIUM  The following foods and beverages have less than 50 mg of potassium per serving.  · Bread, white, 1 slice (30 mg).  · Carbonated beverages, 12 oz (less than 5 mg).  · Cheese, 1 oz (20-30 mg).  · Cranberries, ½ cup (45 mg).  · Cranberry juice cocktail, ½ cup (20 mg).  · Fats and oils, 1 Tbsp (less than 5 mg).  · Hummus, 1 Tbsp (32 mg).  · Nectar: papaya, mango, or pear, ½ cup (35 mg).  · Rice, white or brown, ½ cup (50 mg).  · Spaghetti or macaroni, ½ cup cooked (30 mg).  · Tortilla, flour or corn, 1 each (50 mg).  · Waffle, 4 in., 1 each (50 mg).  · Water chestnuts, ½ cup (40 mg).  Document Released: 09/08/2004 Document Revised: 01/30/2013 Document Reviewed: 12/22/2012  ExitCare® Patient Information ©2015 [employer]. This information is not intended to replace advice given to you by your health care provider. Make sure you discuss any questions you have with your health care provider.

## 2013-10-31 NOTE — Progress Notes (Signed)
Pre visit review using our clinic review tool, if applicable. No additional management support is needed unless otherwise documented below in the visit note. 

## 2013-11-01 ENCOUNTER — Ambulatory Visit: Payer: 59

## 2013-11-01 ENCOUNTER — Other Ambulatory Visit: Payer: Self-pay | Admitting: Family

## 2013-11-01 DIAGNOSIS — E039 Hypothyroidism, unspecified: Secondary | ICD-10-CM

## 2013-11-01 LAB — T4, FREE: Free T4: 1.16 ng/dL (ref 0.60–1.60)

## 2013-11-01 LAB — ANA: Anti Nuclear Antibody(ANA): NEGATIVE

## 2013-11-01 LAB — T3, FREE: T3, Free: 3.5 pg/mL (ref 2.3–4.2)

## 2013-11-01 MED ORDER — POTASSIUM CHLORIDE ER 10 MEQ PO TBCR: 10.0000 meq | EXTENDED_RELEASE_TABLET | Freq: Every day | ORAL | Status: DC

## 2013-11-12 ENCOUNTER — Telehealth: Payer: Self-pay | Admitting: Family

## 2013-11-12 ENCOUNTER — Other Ambulatory Visit: Payer: Self-pay

## 2013-11-12 DIAGNOSIS — Z0000 Encounter for general adult medical examination without abnormal findings: Secondary | ICD-10-CM

## 2013-11-12 NOTE — Telephone Encounter (Signed)
Pt calling to report she has stopped taking the potassium chloride (K-DUR) 10 MEQ tablet prescribed at last ov due to the side effects she is experiencing.  She started medication on last Thursday and experienced sob, nausea and extreme pressure behind right eye and was unable to go to work on Friday.  She has not experienced any numbness or tingling sensation since taking the potassium chloride as she did before.  However, she did not take the medication last night and she is starting to feel better.    Pt wants to know what she should do in the mean time as her appt with neurology isn't scheduled until 11/3, which is the first available appt.  She has an appt to see Padonda on 11/5 for cpx, should she come in to see PCP before this date?  What does PCP recommend?

## 2013-11-12 NOTE — Telephone Encounter (Signed)
Please advised  

## 2013-11-12 NOTE — Telephone Encounter (Signed)
Increase potassium in the diet, green leafy veggies. Stop potassium.

## 2013-11-12 NOTE — Telephone Encounter (Signed)
Pt aware and verbalized understanding.  

## 2013-11-26 ENCOUNTER — Other Ambulatory Visit: Payer: Self-pay

## 2013-11-26 DIAGNOSIS — Z1239 Encounter for other screening for malignant neoplasm of breast: Secondary | ICD-10-CM

## 2013-11-29 ENCOUNTER — Telehealth: Payer: Self-pay | Admitting: Family

## 2013-11-29 NOTE — Telephone Encounter (Addendum)
Pt would like to get in sooner for cpx. NP is not avail  On 12/13/13. Can I create 30 min slot?

## 2013-11-29 NOTE — Telephone Encounter (Signed)
Ok

## 2013-11-30 NOTE — Telephone Encounter (Signed)
Pt has been sch

## 2013-11-30 NOTE — Telephone Encounter (Signed)
lmom for pt to cb

## 2013-12-03 ENCOUNTER — Other Ambulatory Visit (INDEPENDENT_AMBULATORY_CARE_PROVIDER_SITE_OTHER): Payer: 59

## 2013-12-03 DIAGNOSIS — R0602 Shortness of breath: Secondary | ICD-10-CM

## 2013-12-03 DIAGNOSIS — E0590 Thyrotoxicosis, unspecified without thyrotoxic crisis or storm: Secondary | ICD-10-CM

## 2013-12-03 DIAGNOSIS — Z0000 Encounter for general adult medical examination without abnormal findings: Secondary | ICD-10-CM

## 2013-12-03 LAB — CBC WITH DIFFERENTIAL/PLATELET
BASOS ABS: 0 10*3/uL (ref 0.0–0.1)
Basophils Relative: 0.8 % (ref 0.0–3.0)
Eosinophils Absolute: 0.3 10*3/uL (ref 0.0–0.7)
Eosinophils Relative: 4.9 % (ref 0.0–5.0)
HEMATOCRIT: 37.8 % (ref 36.0–46.0)
Hemoglobin: 12.8 g/dL (ref 12.0–15.0)
LYMPHS ABS: 2.6 10*3/uL (ref 0.7–4.0)
Lymphocytes Relative: 43.1 % (ref 12.0–46.0)
MCHC: 33.8 g/dL (ref 30.0–36.0)
MCV: 89 fl (ref 78.0–100.0)
MONO ABS: 0.5 10*3/uL (ref 0.1–1.0)
MONOS PCT: 8.9 % (ref 3.0–12.0)
Neutro Abs: 2.6 10*3/uL (ref 1.4–7.7)
Neutrophils Relative %: 42.3 % — ABNORMAL LOW (ref 43.0–77.0)
PLATELETS: 289 10*3/uL (ref 150.0–400.0)
RBC: 4.25 Mil/uL (ref 3.87–5.11)
RDW: 13 % (ref 11.5–15.5)
WBC: 6 10*3/uL (ref 4.0–10.5)

## 2013-12-03 LAB — T3, FREE: T3, Free: 3.4 pg/mL (ref 2.3–4.2)

## 2013-12-03 LAB — POCT URINALYSIS DIPSTICK
Glucose, UA: NEGATIVE
Leukocytes, UA: NEGATIVE
Nitrite, UA: NEGATIVE
SPEC GRAV UA: 1.02
Urobilinogen, UA: 0.2
pH, UA: 5.5

## 2013-12-03 LAB — LIPID PANEL
Cholesterol: 147 mg/dL (ref 0–200)
HDL: 45.8 mg/dL (ref >39.00)
LDL Cholesterol: 87 mg/dL (ref 0–99)
NonHDL: 101.2
Total CHOL/HDL Ratio: 3
Triglycerides: 73 mg/dL (ref 0.0–149.0)
VLDL: 14.6 mg/dL (ref 0.0–40.0)

## 2013-12-03 LAB — COMPREHENSIVE METABOLIC PANEL
ALK PHOS: 56 U/L (ref 39–117)
ALT: 13 U/L (ref 0–35)
AST: 18 U/L (ref 0–37)
Albumin: 3.2 g/dL — ABNORMAL LOW (ref 3.5–5.2)
BUN: 9 mg/dL (ref 6–23)
CO2: 30 meq/L (ref 19–32)
Calcium: 9 mg/dL (ref 8.4–10.5)
Chloride: 106 mEq/L (ref 96–112)
Creatinine, Ser: 1 mg/dL (ref 0.4–1.2)
GFR: 80.28 mL/min (ref >60.00)
Glucose, Bld: 89 mg/dL (ref 70–99)
POTASSIUM: 3.8 meq/L (ref 3.5–5.1)
SODIUM: 140 meq/L (ref 135–145)
TOTAL PROTEIN: 7.6 g/dL (ref 6.0–8.3)
Total Bilirubin: 0.8 mg/dL (ref 0.2–1.2)

## 2013-12-03 LAB — TSH: TSH: 0.2 u[IU]/mL — ABNORMAL LOW (ref 0.35–4.50)

## 2013-12-03 LAB — T4, FREE: FREE T4: 1.15 ng/dL (ref 0.60–1.60)

## 2013-12-03 LAB — D-DIMER, QUANTITATIVE (NOT AT ARMC): D DIMER QUANT: 0.76 ug{FEU}/mL — AB (ref 0.00–0.48)

## 2013-12-07 ENCOUNTER — Encounter: Payer: Self-pay | Admitting: Family

## 2013-12-07 ENCOUNTER — Ambulatory Visit (INDEPENDENT_AMBULATORY_CARE_PROVIDER_SITE_OTHER): Payer: 59 | Admitting: Family

## 2013-12-07 VITALS — BP 124/82 | HR 69 | Ht 68.5 in | Wt 224.7 lb

## 2013-12-07 DIAGNOSIS — F909 Attention-deficit hyperactivity disorder, unspecified type: Secondary | ICD-10-CM

## 2013-12-07 DIAGNOSIS — I10 Essential (primary) hypertension: Secondary | ICD-10-CM

## 2013-12-07 DIAGNOSIS — Z0000 Encounter for general adult medical examination without abnormal findings: Secondary | ICD-10-CM

## 2013-12-07 DIAGNOSIS — F988 Other specified behavioral and emotional disorders with onset usually occurring in childhood and adolescence: Secondary | ICD-10-CM

## 2013-12-07 MED ORDER — AMPHETAMINE-DEXTROAMPHET ER 20 MG PO CP24: 20.0000 mg | ORAL_CAPSULE | ORAL | Status: DC

## 2013-12-07 MED ORDER — LISINOPRIL-HYDROCHLOROTHIAZIDE 20-12.5 MG PO TABS: 1.0000 | ORAL_TABLET | Freq: Every day | ORAL | Status: DC

## 2013-12-07 NOTE — Progress Notes (Signed)
Pre visit review using our clinic review tool, if applicable. No additional management support is needed unless otherwise documented below in the visit note. 

## 2013-12-07 NOTE — Progress Notes (Signed)
Subjective:    Patient ID: Candice Parker, female    DOB: April 15, 1970, 44 y.o.   MRN: [id number]  HPI   44 year old AAF, nonsmoker, is in today for CPX. Continues to have facial numbness and tingling and is due to see neurology next week. She is otherwise stable. Has a history of ADD and tolerartes medications well. Pap smear last year was normal. Mammogram is scheduled. Does not routinely exercise.   Review of Systems  Constitutional: Negative.   HENT: Negative.   Eyes: Negative.   Respiratory: Negative.   Cardiovascular: Negative.   Gastrointestinal: Negative.   Endocrine: Negative.   Genitourinary: Negative.   Musculoskeletal: Negative.   Skin: Negative.   Allergic/Immunologic: Negative.   Neurological: Negative.   Hematological: Negative.   Psychiatric/Behavioral: Negative.     Past Medical History  Diagnosis Date  . ASTHMA, INTRINSIC 01/29/2009  . Hypertension   . Overweight(278.02) 12/10/2010  . Breast lesion 09/29/2011    History   Social History  . Marital Status: Single    Spouse Name: N/A    Number of Children: 1  . Years of Education: N/A   Occupational History  .  [employer]    Office supervisor   Social History Main Topics  . Smoking status: Never Smoker   . Smokeless tobacco: Never Used  . Alcohol Use: Yes     Comment: Occasional  . Drug Use: No  . Sexual Activity: Not on file   Other Topics Concern  . Not on file   Social History Narrative  . No narrative on file    Past Surgical History  Procedure Laterality Date  . Elbow fracture surgery    . Inner ear surgery    . Total abdominal hysterectomy      Family History  Problem Relation Age of Onset  . Hypertension Mother   . Hyperlipidemia Mother   . Heart failure Father   . Kidney disease Father     failure  . Cancer Paternal Grandmother   . Obesity Other     No Known Allergies  Current Outpatient Prescriptions on File Prior to Visit  Medication Sig Dispense Refill  .  albuterol (PROVENTIL HFA;VENTOLIN HFA) 108 (90 BASE) MCG/ACT inhaler Inhale 1-2 puffs into the lungs every 6 (six) hours as needed for wheezing or shortness of breath.      . amphetamine-dextroamphetamine (ADDERALL) 10 MG tablet Take 1 tablet (10 mg total) by mouth daily.  90 tablet  0  . ibuprofen (ADVIL,MOTRIN) 200 MG tablet Take 400 mg by mouth every 6 (six) hours as needed.       No current facility-administered medications on file prior to visit.    BP 124/82  Pulse 69  Ht 5' 8.5" (1.74 m)  Wt 224 lb 11.2 oz (101.923 kg)  BMI 33.66 kg/m2chart     Objective:   Physical Exam  Constitutional: She is oriented to person, place, and time. She appears well-developed and well-nourished.  HENT:  Head: Normocephalic.  Right Ear: External ear normal.  Left Ear: External ear normal.  Nose: Nose normal.  Mouth/Throat: Oropharynx is clear and moist.  Eyes: Conjunctivae and EOM are normal. Pupils are equal, round, and reactive to light.  Neck: Normal range of motion. Neck supple. No thyromegaly present.  Cardiovascular: Normal rate, regular rhythm and normal heart sounds.   Pulmonary/Chest: Effort normal and breath sounds normal.  Abdominal: Soft. Bowel sounds are normal.  Musculoskeletal: Normal range of motion.  Neurological: She  is alert and oriented to person, place, and time. She has normal reflexes.  Skin: Skin is warm and dry.  Psychiatric: She has a normal mood and affect.          Assessment & Plan:  Candice Parker was seen today for annual exam.  Diagnoses and associated orders for this visit:  Preventative health care  Essential hypertension  ADD (attention deficit disorder)  Other Orders - amphetamine-dextroamphetamine (ADDERALL XR) 20 MG 24 hr capsule; Take 1 capsule (20 mg total) by mouth every morning. - lisinopril-hydrochlorothiazide (ZESTORETIC) 20-12.5 MG per tablet; Take 1 tablet by mouth daily.   Call the office with any questions or concerns. Recheck in 3  months, pending neurology, and sooner as needed.

## 2013-12-07 NOTE — Patient Instructions (Signed)
Exercise to Stay Healthy Exercise helps you become and stay healthy. EXERCISE IDEAS AND TIPS Choose exercises that:  You enjoy.  Fit into your day. You do not need to exercise really hard to be healthy. You can do exercises at a slow or medium level and stay healthy. You can:  Stretch before and after working out.  Try yoga, Pilates, or tai chi.  Lift weights.  Walk fast, swim, jog, run, climb stairs, bicycle, dance, or rollerskate.  Take aerobic classes. Exercises that burn about 150 calories:  Running 1  miles in 15 minutes.  Playing volleyball for 45 to 60 minutes.  Washing and waxing a car for 45 to 60 minutes.  Playing touch football for 45 minutes.  Walking 1  miles in 35 minutes.  Pushing a stroller 1  miles in 30 minutes.  Playing basketball for 30 minutes.  Raking leaves for 30 minutes.  Bicycling 5 miles in 30 minutes.  Walking 2 miles in 30 minutes.  Dancing for 30 minutes.  Shoveling snow for 15 minutes.  Swimming laps for 20 minutes.  Walking up stairs for 15 minutes.  Bicycling 4 miles in 15 minutes.  Gardening for 30 to 45 minutes.  Jumping rope for 15 minutes.  Washing windows or floors for 45 to 60 minutes. Document Released: 02/27/2010 Document Revised: 04/19/2011 Document Reviewed: 02/27/2010 ExitCare Patient Information 2015 [employer]. This information is not intended to replace advice given to you by your health care provider. Make sure you discuss any questions you have with your health care provider.  

## 2013-12-10 ENCOUNTER — Telehealth: Payer: Self-pay | Admitting: Family

## 2013-12-10 NOTE — Telephone Encounter (Signed)
emmi emailed °

## 2013-12-11 ENCOUNTER — Ambulatory Visit (INDEPENDENT_AMBULATORY_CARE_PROVIDER_SITE_OTHER): Payer: 59 | Admitting: Neurology

## 2013-12-11 ENCOUNTER — Encounter: Payer: Self-pay | Admitting: Neurology

## 2013-12-11 VITALS — BP 122/66 | HR 88 | Resp 16 | Ht 68.0 in | Wt 224.0 lb

## 2013-12-11 DIAGNOSIS — R200 Anesthesia of skin: Secondary | ICD-10-CM

## 2013-12-11 DIAGNOSIS — R208 Other disturbances of skin sensation: Secondary | ICD-10-CM

## 2013-12-11 DIAGNOSIS — R202 Paresthesia of skin: Secondary | ICD-10-CM

## 2013-12-11 LAB — VITAMIN B12: VITAMIN B 12: 1111 pg/mL — AB (ref 211–911)

## 2013-12-11 NOTE — Progress Notes (Signed)
NEUROLOGY CONSULTATION NOTE  Candice Parker MRN: [id number] DOB: 1970-04-05  Referring provider: Roxy Cedar, FNP Primary care provider: Roxy Cedar, FNP  Reason for consult:  Right facial numbness  HISTORY OF PRESENT ILLNESS: Candice Parker is a 42 year old rihg-handed woman with history of asthma, ADD, hypertension and mycoplasma pneumonia who presents for right facial numbness.  She began experiencing right facial tingling about 3 months ago.  It is transient, lasting less than 1 minutes.  It involves the entire right side of her face from the hairline to the chin.  There is no associated headache, facial weakness, visual disturbance or vertigo. It usually occurs towards the end of the day after work.  Initially it occurred once a week, but then progressed to once every other day.  However, her last episode was a week ago.  Last week she had an episode where she developed sudden onset numbness of the entire right side of her body from head to feet.  It was associated with lightheadedness, like she was about to pass out.  It lasted less than a minute and resolved.  She has not since had another similar spell.  She also reports some memory problems, which have been ongoing for several years.  She reports simple word-finding difficulties.  It occurs at the end of the day when she is more fatigued.  She takes Adderal/  For a couple of weeks, she began feeling that the bottom of her feet feel hot.  It is not burning, numb or painful.   She notices it at night while in bed.  Usually her feet feel cold but now she has to keep them outside of the covers.  Lab work performed on 10/31/13, included ANA negative, WBC 7.3, Hgb 12.3, HCT 38.5, PLT 290, Na 138, K 3.4, Cl 102, CO2 29, glucose 98, BUN 8, Cr 0.8, ALP 64, AST 21, ALT 17.  TSH was 0.29.  Free T4 was 1.16 and free T3 was 3.5.  PAST MEDICAL HISTORY: Past Medical History  Diagnosis Date  . ASTHMA, INTRINSIC 01/29/2009  .  Hypertension   . Overweight(278.02) 12/10/2010  . Breast lesion 09/29/2011    PAST SURGICAL HISTORY: Past Surgical History  Procedure Laterality Date  . Elbow fracture surgery    . Inner ear surgery    . Total abdominal hysterectomy      MEDICATIONS: Current Outpatient Prescriptions on File Prior to Visit  Medication Sig Dispense Refill  . albuterol (PROVENTIL HFA;VENTOLIN HFA) 108 (90 BASE) MCG/ACT inhaler Inhale 1-2 puffs into the lungs every 6 (six) hours as needed for wheezing or shortness of breath.    . amphetamine-dextroamphetamine (ADDERALL XR) 20 MG 24 hr capsule Take 1 capsule (20 mg total) by mouth every morning. 90 capsule 0  . amphetamine-dextroamphetamine (ADDERALL) 10 MG tablet Take 1 tablet (10 mg total) by mouth daily. 90 tablet 0  . ibuprofen (ADVIL,MOTRIN) 200 MG tablet Take 400 mg by mouth every 6 (six) hours as needed.    Marland Kitchen lisinopril-hydrochlorothiazide (ZESTORETIC) 20-12.5 MG per tablet Take 1 tablet by mouth daily. 90 tablet 1  . Multiple Vitamin (MULTIVITAMIN) tablet Take 1 tablet by mouth daily. Women's One-A-Day     No current facility-administered medications on file prior to visit.    ALLERGIES: No Known Allergies  FAMILY HISTORY: Family History  Problem Relation Age of Onset  . Hypertension Mother   . Hyperlipidemia Mother   . Heart failure Father   . Kidney disease Father  failure  . Cancer Paternal Grandmother   . Obesity Other   . Cancer Maternal Grandmother     breast     SOCIAL HISTORY: History   Social History  . Marital Status: Single    Spouse Name: N/A    Number of Children: 1  . Years of Education: N/A   Occupational History  .  [employer]    Office supervisor   Social History Main Topics  . Smoking status: Never Smoker   . Smokeless tobacco: Never Used  . Alcohol Use: 0.0 oz/week    0 Not specified per week     Comment: Occasional  . Drug Use: No  . Sexual Activity: No   Other Topics Concern  . Not on file    Social History Narrative    REVIEW OF SYSTEMS: Constitutional: No fevers, chills, or sweats, no generalized fatigue, change in appetite Eyes: No visual changes, double vision, eye pain Ear, nose and throat: No hearing loss, ear pain, nasal congestion, sore throat Cardiovascular: No chest pain, palpitations Respiratory:  No shortness of breath at rest or with exertion, wheezes GastrointestinaI: No nausea, vomiting, diarrhea, abdominal pain, fecal incontinence Genitourinary:  No dysuria, urinary retention or frequency Musculoskeletal:  No neck pain, back pain Integumentary: No rash, pruritus, skin lesions Neurological: as above Psychiatric: No depression, insomnia, anxiety Endocrine: No palpitations, fatigue, diaphoresis, mood swings, change in appetite, change in weight, increased thirst Hematologic/Lymphatic:  No anemia, purpura, petechiae. Allergic/Immunologic: no itchy/runny eyes, nasal congestion, recent allergic reactions, rashes  PHYSICAL EXAM: Filed Vitals:   12/11/13 0831  BP: 122/66  Pulse: 88  Resp: 16   General: No acute distress Head:  Normocephalic/atraumatic Neck: supple, no paraspinal tenderness, full range of motion Back: No paraspinal tenderness Heart: regular rate and rhythm Lungs: Clear to auscultation bilaterally. Vascular: No carotid bruits. Neurological Exam: Mental status: alert and oriented to person, place, and time, recent and remote memory intact, fund of knowledge intact, attention and concentration intact, speech fluent and not dysarthric, language intact. MMSE - Mini Mental State Exam 12/11/2013  Orientation to time 5  Orientation to Place 5  Registration 3  Attention/ Calculation 5  Recall 3  Language- name 2 objects 2  Language- repeat 1  Language- follow 3 step command 3  Language- read & follow direction 1  Write a sentence 1  Copy design 1  Total score 30   Cranial nerves: CN I: not tested CN II: pupils equal, round and reactive  to light, visual fields intact, fundi unremarkable, without vessel changes, exudates, hemorrhages or papilledema. CN III, IV, VI:  full range of motion, no nystagmus, no ptosis CN V: decreased sensation to right V2 distribution CN VII: upper and lower face symmetric CN VIII: hearing intact CN IX, X: gag intact, uvula midline CN XI: sternocleidomastoid and trapezius muscles intact CN XII: tongue midline Bulk & Tone: normal, no fasciculations. Motor: 5/5 throughout Sensation: pinprick and vibration intact. Deep Tendon Reflexes: 2+ throughout, toes downgoing Finger to nose testing: no dysmetria Heel to shin: no dysmetria Gait: normal station and stride.  Able to turn and walk in tandem. Romberg negative.  IMPRESSION: Transient right facial numbness Transient right sided numbness Memory deficits.  No cognitive impairment noted.  Likely related to difficulty focusing and ADHD Numbness in the feet  PLAN: 1.  MRI of the brain with and without contrast 2.  B12  45 minutes spent with patient, over 50% spent discussing possible diagnoses and coordinating care.  Thank you  for allowing me to take part in the care of this patient.  Metta Clines, DO  CC:  Roxy Cedar, FNP

## 2013-12-11 NOTE — Patient Instructions (Addendum)
We will check an MRI of the brain with and without contrast. [facility]  12/24/13 7:45 am   If this is unremarkable, I really have no explanation for your symptoms.  However, your exam is okay and we would have ruled out anything serious.  If the MRI is abnormal or symptoms worsen then please follow up.  We will check a vitamin b12 level too.

## 2013-12-12 ENCOUNTER — Ambulatory Visit (HOSPITAL_COMMUNITY)
Admission: RE | Admit: 2013-12-12 | Discharge: 2013-12-12 | Disposition: A | Discharge status: Home / Self Care | Payer: 59 | Source: Ambulatory Visit | Attending: Neurology | Admitting: Neurology

## 2013-12-12 ENCOUNTER — Telehealth: Payer: Self-pay | Admitting: *Deleted

## 2013-12-12 DIAGNOSIS — R202 Paresthesia of skin: Secondary | ICD-10-CM

## 2013-12-12 DIAGNOSIS — R200 Anesthesia of skin: Secondary | ICD-10-CM

## 2013-12-12 NOTE — Telephone Encounter (Signed)
Patient is aware of Lab results . She also informed me that  she may not be able to have MRI due to a device she has in her ear.

## 2013-12-12 NOTE — Telephone Encounter (Signed)
-----   Message from Dudley Major, DO sent at 12/11/2013  3:16 PM EST ----- b12 level is okay ----- Message -----    From: Lab in [facility] Interface    Sent: 12/11/2013   2:43 PM      To: Dudley Major, DO

## 2013-12-13 ENCOUNTER — Other Ambulatory Visit: Payer: 59

## 2013-12-13 ENCOUNTER — Encounter: Payer: 59 | Admitting: Family

## 2013-12-17 ENCOUNTER — Encounter: Payer: 59 | Admitting: Family

## 2013-12-20 ENCOUNTER — Ambulatory Visit (HOSPITAL_COMMUNITY)
Admission: RE | Admit: 2013-12-20 | Discharge: 2013-12-20 | Disposition: A | Discharge status: Home / Self Care | Payer: 59 | Source: Ambulatory Visit | Attending: Neurology | Admitting: Neurology

## 2013-12-20 DIAGNOSIS — R209 Unspecified disturbances of skin sensation: Secondary | ICD-10-CM | POA: Insufficient documentation

## 2013-12-20 DIAGNOSIS — R413 Other amnesia: Secondary | ICD-10-CM | POA: Diagnosis not present

## 2013-12-20 DIAGNOSIS — R55 Syncope and collapse: Secondary | ICD-10-CM | POA: Insufficient documentation

## 2013-12-20 MED ORDER — GADOBENATE DIMEGLUMINE 529 MG/ML IV SOLN
20.0000 mL | Freq: Once | INTRAVENOUS | Status: AC | PRN
  Administered 2013-12-20: 20 mL via INTRAVENOUS

## 2013-12-21 ENCOUNTER — Telehealth: Payer: Self-pay | Admitting: Neurology

## 2013-12-21 NOTE — Telephone Encounter (Signed)
Patient is aware that MRI has not been resulted to me as of yet . I will call her as soon as Dr Tomi Likens results to me

## 2013-12-21 NOTE — Telephone Encounter (Signed)
Pt called f/u on her MRI results from her MRI yesterday 12/20/13. C/B [phone]

## 2013-12-24 ENCOUNTER — Telehealth: Payer: Self-pay | Admitting: *Deleted

## 2013-12-24 ENCOUNTER — Ambulatory Visit (HOSPITAL_COMMUNITY): Payer: 59

## 2013-12-24 NOTE — Telephone Encounter (Signed)
-----   Message from Dudley Major, DO sent at 12/22/2013 12:42 PM EST ----- Mri normal ----- Message -----    From: Rad Results In Interface    Sent: 12/20/2013   8:40 PM      To: Dudley Major, DO

## 2013-12-24 NOTE — Telephone Encounter (Signed)
Patient is aware of normal MRI 

## 2013-12-27 ENCOUNTER — Other Ambulatory Visit: Payer: Self-pay

## 2013-12-27 DIAGNOSIS — Z1231 Encounter for screening mammogram for malignant neoplasm of breast: Secondary | ICD-10-CM

## 2013-12-28 ENCOUNTER — Ambulatory Visit
Admission: RE | Admit: 2013-12-28 | Discharge: 2013-12-28 | Disposition: A | Discharge status: Home / Self Care | Payer: 59 | Source: Ambulatory Visit

## 2013-12-28 DIAGNOSIS — Z1231 Encounter for screening mammogram for malignant neoplasm of breast: Secondary | ICD-10-CM

## 2014-03-04 ENCOUNTER — Encounter: Payer: Self-pay | Admitting: Family

## 2014-03-08 ENCOUNTER — Other Ambulatory Visit: Payer: Self-pay | Admitting: Family

## 2014-03-08 ENCOUNTER — Encounter: Payer: Self-pay | Admitting: Family

## 2014-03-08 MED ORDER — AMPHETAMINE-DEXTROAMPHET ER 20 MG PO CP24: 20.0000 mg | ORAL_CAPSULE | ORAL | Status: DC

## 2014-03-08 MED ORDER — LISINOPRIL-HYDROCHLOROTHIAZIDE 20-12.5 MG PO TABS
1.0000 | ORAL_TABLET | Freq: Every day | ORAL | Status: DC
Start: 2014-03-08 — End: 2014-06-13

## 2014-06-13 ENCOUNTER — Ambulatory Visit (INDEPENDENT_AMBULATORY_CARE_PROVIDER_SITE_OTHER): Payer: 59 | Admitting: Family Medicine

## 2014-06-13 ENCOUNTER — Encounter: Payer: Self-pay | Admitting: Family Medicine

## 2014-06-13 VITALS — BP 118/82 | HR 87 | Temp 98.1°F | Wt 216.0 lb

## 2014-06-13 DIAGNOSIS — J4520 Mild intermittent asthma, uncomplicated: Secondary | ICD-10-CM

## 2014-06-13 DIAGNOSIS — F909 Attention-deficit hyperactivity disorder, unspecified type: Secondary | ICD-10-CM

## 2014-06-13 DIAGNOSIS — B009 Herpesviral infection, unspecified: Secondary | ICD-10-CM | POA: Insufficient documentation

## 2014-06-13 DIAGNOSIS — I10 Essential (primary) hypertension: Secondary | ICD-10-CM

## 2014-06-13 DIAGNOSIS — F988 Other specified behavioral and emotional disorders with onset usually occurring in childhood and adolescence: Secondary | ICD-10-CM

## 2014-06-13 DIAGNOSIS — M25562 Pain in left knee: Secondary | ICD-10-CM | POA: Diagnosis not present

## 2014-06-13 DIAGNOSIS — M25522 Pain in left elbow: Secondary | ICD-10-CM | POA: Insufficient documentation

## 2014-06-13 MED ORDER — LISINOPRIL-HYDROCHLOROTHIAZIDE 20-12.5 MG PO TABS: 1.0000 | ORAL_TABLET | Freq: Every day | ORAL | Status: DC

## 2014-06-13 MED ORDER — ALBUTEROL SULFATE HFA 108 (90 BASE) MCG/ACT IN AERS: 1.0000 | INHALATION_SPRAY | Freq: Four times a day (QID) | RESPIRATORY_TRACT | Status: DC | PRN

## 2014-06-13 MED ORDER — AMPHETAMINE-DEXTROAMPHET ER 20 MG PO CP24: 20.0000 mg | ORAL_CAPSULE | ORAL | Status: DC

## 2014-06-13 NOTE — Assessment & Plan Note (Signed)
S: Diagnosed by Dr. Cheryln Manly around 2010. Dr. Leanne Chang then Dr. Megan Salon providing [place]. Patient has done well with regimen of Adderall XR 20mg . Has prior rx for extra 10mg  on hand if needed for late workday. She states symptoms are controlled with work and she does not take on weekends A/P: 3 months rx provided, call in 3 months from now with physical in 6 months.

## 2014-06-13 NOTE — Patient Instructions (Addendum)
Great to meet you!   Refilled BP meds for a year  Gave another 3 months rx for adderrall  Call in 3 months and can provide another rx for adderrall  See me in 6 months for a physical with labs a week before if desired (or can do them day of if needed)  Call me if you decide you want to get orthopedics opinion on your knee. I suspect this is a lateral meniscal injury.

## 2014-06-13 NOTE — Assessment & Plan Note (Signed)
S:Albuterol prn. Usually 1-2x per year. controlled A/P: refilled albuterol as old rx expired as uses so infrequent. Sign of excellent control.

## 2014-06-13 NOTE — Assessment & Plan Note (Signed)
S: controlled on Lisinopril-hctz 20-12.5mg . Working with Physiological scientist through [employer] lost 10 lbs since December.  A/P: great job with weight loss- encouraged continued efforts. Continue current rx-1 year refill provided.

## 2014-06-13 NOTE — Progress Notes (Signed)
Candice Reddish, MD Phone: [phone]  Subjective:  Patient presents today to establish care with me as their new primary care provider. Patient was formerly a patient of Dr. Megan Salon. Chief complaint-noted.   See problem oriented charting ROS- describes inattention on the weekends, usually able to focus through most of work day. No chest pain or shortness of breath.   The following were reviewed and entered/updated in epic: Past Medical History  Diagnosis Date  . ASTHMA, INTRINSIC 01/29/2009  . Hypertension   . Overweight(278.02) 12/10/2010  . Breast lesion 09/29/2011  . Acid reflux 12/10/2010   Patient Active Problem List   Diagnosis Date Noted  . ADD (attention deficit disorder) 06/13/2014    Priority: High  . Left knee pain 06/13/2014    Priority: Medium  . HTN (hypertension) 07/17/2010    Priority: Medium  . Left elbow pain 06/13/2014    Priority: Low  . Herpes simplex 06/13/2014    Priority: Low  . Chest pain, atypical 04/11/2011    Priority: Low  . Intermittent memory loss 02/18/2011    Priority: Low  . Obesity 12/10/2010    Priority: Low  . Asthma 01/29/2009    Priority: Low   Past Surgical History  Procedure Laterality Date  . Elbow fracture surgery    . Inner ear surgery    . Total abdominal hysterectomy      fibroid uterus.    Family History  Problem Relation Age of Onset  . Hypertension Mother   . Hyperlipidemia Mother   . Heart failure Father   . Kidney disease Father     failure  . Breast cancer Paternal Grandmother   . Breast cancer Maternal Grandmother   . Hypertension Brother     Medications- reviewed and updated Current Outpatient Prescriptions  Medication Sig Dispense Refill  . amphetamine-dextroamphetamine (ADDERALL XR) 20 MG 24 hr capsule Take 1 capsule (20 mg total) by mouth every morning. 90 capsule 0  . lisinopril-hydrochlorothiazide (ZESTORETIC) 20-12.5 MG per tablet Take 1 tablet by mouth daily. 90 tablet 1  . Multiple Vitamin  (MULTIVITAMIN) tablet Take 1 tablet by mouth daily. Women's One-A-Day    . albuterol (PROVENTIL HFA;VENTOLIN HFA) 108 (90 BASE) MCG/ACT inhaler Inhale 1-2 puffs into the lungs every 6 (six) hours as needed for wheezing or shortness of breath.    Marland Kitchen ibuprofen (ADVIL,MOTRIN) 200 MG tablet Take 400 mg by mouth every 6 (six) hours as needed.     Allergies-reviewed and updated No Known Allergies  History   Social History  . Marital Status: Single    Spouse Name: N/A  . Number of Children: 1  . Years of Education: N/A   Occupational History  .  [employer]    Office supervisor   Social History Main Topics  . Smoking status: Never Smoker   . Smokeless tobacco: Never Used  . Alcohol Use: 0.0 oz/week    0 Standard drinks or equivalent per week     Comment: Occasional  . Drug Use: No  . Sexual Activity: No   Other Topics Concern  . None   Social History Narrative   Single. 53 year son in school at [employer] in 2016.       Works for [employer] at [employer]: enjoys going out to eat, travel     ROS--See HPI   Objective: BP 118/82 mmHg  Pulse 87  Temp(Src) 98.1 F (36.7 C)  Wt 216 lb (97.977 kg) Gen: NAD, resting comfortably  HEENT: Mucous membranes are moist. Oropharynx normal CV: RRR no murmurs rubs or gallops Lungs: CTAB no crackles, wheeze, rhonchi Abdomen: soft/nontender/nondistended/normal bowel sounds.  Ext: no edema Skin: warm, dry, no rash  Left Knee: Normal to inspection with no erythema or effusion or obvious bony abnormalities. Palpation normal with no warmth or joint line tenderness or patellar tenderness or condyle tenderness. ROM normal in flexion and extension and lower leg rotation. Ligaments with solid consistent endpoints including ACL, PCL, LCL, MCL. McMurrays produces painful click on lateral meniscus testing  Assessment/Plan:  ADD (attention deficit disorder) S: Diagnosed by Dr. Cheryln Manly around 2010. Dr. Leanne Chang then Dr.  Megan Salon providing [place]. Patient has done well with regimen of Adderall XR 20mg . Has prior rx for extra 10mg  on hand if needed for late workday. She states symptoms are controlled with work and she does not take on weekends A/P: 3 months rx provided, call in 3 months from now with physical in 6 months.     HTN (hypertension) S: controlled on Lisinopril-hctz 20-12.5mg . Working with Physiological scientist through [employer] lost 10 lbs since December.  A/P: great job with weight loss- encouraged continued efforts. Continue current rx-1 year refill provided.     Asthma S:Albuterol prn. Usually 1-2x per year. controlled A/P: refilled albuterol as old rx expired as uses so infrequent. Sign of excellent control.     Left knee pain S: about twice a day feels a click when walking that is painful. No buckling or giving way of the joint.  Able to do squats with working out without trouble though.  A/P: exam and history concerning for meniscal injury of left lateral knee. Offered orthopedics referral but patient declines. She will call us if she decides she wants this or will follow up at CPE    6 months for CPE  Meds ordered this encounter  Medications  . albuterol (PROVENTIL HFA;VENTOLIN HFA) 108 (90 BASE) MCG/ACT inhaler    Sig: Inhale 1-2 puffs into the lungs every 6 (six) hours as needed for wheezing or shortness of breath.    Dispense:  18 g    Refill:  5  . lisinopril-hydrochlorothiazide (ZESTORETIC) 20-12.5 MG per tablet    Sig: Take 1 tablet by mouth daily.    Dispense:  90 tablet    Refill:  3  . amphetamine-dextroamphetamine (ADDERALL XR) 20 MG 24 hr capsule    Sig: Take 1 capsule (20 mg total) by mouth every morning.    Dispense:  90 capsule    Refill:  0

## 2014-06-13 NOTE — Assessment & Plan Note (Signed)
S: about twice a day feels a click when walking that is painful. No buckling or giving way of the joint.  Able to do squats with working out without trouble though.  A/P: exam and history concerning for meniscal injury of left lateral knee. Offered orthopedics referral but patient declines. She will call us if she decides she wants this or will follow up at CPE

## 2014-11-28 ENCOUNTER — Other Ambulatory Visit: Payer: Self-pay

## 2014-11-28 DIAGNOSIS — Z1231 Encounter for screening mammogram for malignant neoplasm of breast: Secondary | ICD-10-CM

## 2014-12-11 ENCOUNTER — Encounter: Payer: Self-pay | Admitting: Family Medicine

## 2014-12-11 ENCOUNTER — Ambulatory Visit (INDEPENDENT_AMBULATORY_CARE_PROVIDER_SITE_OTHER): Payer: 59 | Admitting: Family Medicine

## 2014-12-11 VITALS — BP 112/82 | HR 82 | Temp 98.6°F | Ht 68.0 in | Wt 215.0 lb

## 2014-12-11 DIAGNOSIS — Z0000 Encounter for general adult medical examination without abnormal findings: Secondary | ICD-10-CM | POA: Diagnosis not present

## 2014-12-11 LAB — LIPID PANEL
CHOLESTEROL: 137 mg/dL (ref 0–200)
HDL: 54.5 mg/dL (ref >39.00)
LDL Cholesterol: 72 mg/dL (ref 0–99)
NonHDL: 82.49
TRIGLYCERIDES: 52 mg/dL (ref 0.0–149.0)
Total CHOL/HDL Ratio: 3
VLDL: 10.4 mg/dL (ref 0.0–40.0)

## 2014-12-11 LAB — CBC
HCT: 38.7 % (ref 36.0–46.0)
HEMOGLOBIN: 13.2 g/dL (ref 12.0–15.0)
MCHC: 34.2 g/dL (ref 30.0–36.0)
MCV: 89.5 fl (ref 78.0–100.0)
PLATELETS: 308 10*3/uL (ref 150.0–400.0)
RBC: 4.32 Mil/uL (ref 3.87–5.11)
RDW: 13.2 % (ref 11.5–15.5)
WBC: 6.8 10*3/uL (ref 4.0–10.5)

## 2014-12-11 LAB — COMPREHENSIVE METABOLIC PANEL
ALBUMIN: 3.6 g/dL (ref 3.5–5.2)
ALT: 15 U/L (ref 0–35)
AST: 16 U/L (ref 0–37)
Alkaline Phosphatase: 63 U/L (ref 39–117)
BILIRUBIN TOTAL: 0.6 mg/dL (ref 0.2–1.2)
BUN: 9 mg/dL (ref 6–23)
CALCIUM: 9 mg/dL (ref 8.4–10.5)
CO2: 33 mEq/L — ABNORMAL HIGH (ref 19–32)
CREATININE: 0.81 mg/dL (ref 0.40–1.20)
Chloride: 105 mEq/L (ref 96–112)
GFR: 99.53 mL/min (ref >60.00)
Glucose, Bld: 96 mg/dL (ref 70–99)
Potassium: 3.9 mEq/L (ref 3.5–5.1)
Sodium: 140 mEq/L (ref 135–145)
Total Protein: 7 g/dL (ref 6.0–8.3)

## 2014-12-11 LAB — TSH: TSH: 0.73 u[IU]/mL (ref 0.35–4.50)

## 2014-12-11 MED ORDER — AMPHETAMINE-DEXTROAMPHET ER 20 MG PO CP24: 20.0000 mg | ORAL_CAPSULE | ORAL | Status: DC

## 2014-12-11 MED ORDER — LISINOPRIL-HYDROCHLOROTHIAZIDE 20-12.5 MG PO TABS: 1.0000 | ORAL_TABLET | Freq: Every day | ORAL | Status: DC

## 2014-12-11 NOTE — Patient Instructions (Addendum)
Great job losing another lb. With all of your school and work responsibilities- I really want you to try to maintain weight at this point until things slow down but if you can lose weight great as well. Try to get 30-60 minutes exercise a week at least (setting bar lower with your responsibilities)  Refilled adderrall- call in 3 months for refill then I will see you in 6 months  Blood pressure looks great  I can refill acyclovir cream if needed. You can also send me a message about pills you had and I can fill

## 2014-12-11 NOTE — Progress Notes (Signed)
Candice Reddish, MD Phone: [phone]  Subjective:  Patient presents today for their annual physical. Chief complaint-noted.   -Has lost another lb. Left knee is better. ADD controlled on adderall and requests refill. Fever blister occasionally- cream resolves issue, has used pill in past as well ROS- full  review of systems was completed and negative except for as noted above. Specifically  No chest pain or shortness of breath. No headache or blurry vision.   The following were reviewed and entered/updated in epic: Past Medical History  Diagnosis Date  . ASTHMA, INTRINSIC 01/29/2009  . Hypertension   . Overweight(278.02) 12/10/2010  . Breast lesion 09/29/2011  . Acid reflux 12/10/2010   Patient Active Problem List   Diagnosis Date Noted  . ADD (attention deficit disorder) 06/13/2014    Priority: High  . Left knee pain 06/13/2014    Priority: Medium  . HTN (hypertension) 07/17/2010    Priority: Medium  . Left elbow pain 06/13/2014    Priority: Low  . Herpes simplex 06/13/2014    Priority: Low  . Chest pain, atypical 04/11/2011    Priority: Low  . Intermittent memory loss 02/18/2011    Priority: Low  . Obesity 12/10/2010    Priority: Low  . Asthma 01/29/2009    Priority: Low   Past Surgical History  Procedure Laterality Date  . Elbow fracture surgery    . Inner ear surgery    . Total abdominal hysterectomy      fibroid uterus.    Family History  Problem Relation Age of Onset  . Hypertension Mother   . Hyperlipidemia Mother   . Heart failure Father   . Kidney disease Father     failure  . Breast cancer Paternal Grandmother   . Breast cancer Maternal Grandmother   . Hypertension Brother     Medications- reviewed and updated Current Outpatient Prescriptions  Medication Sig Dispense Refill  . amphetamine-dextroamphetamine (ADDERALL XR) 20 MG 24 hr capsule Take 1 capsule (20 mg total) by mouth every morning. 90 capsule 0  . ibuprofen (ADVIL,MOTRIN) 200 MG  tablet Take 400 mg by mouth every 6 (six) hours as needed.    Marland Kitchen lisinopril-hydrochlorothiazide (ZESTORETIC) 20-12.5 MG tablet Take 1 tablet by mouth daily. 90 tablet 3  . Multiple Vitamin (MULTIVITAMIN) tablet Take 1 tablet by mouth daily. Women's One-A-Day    . albuterol (PROVENTIL HFA;VENTOLIN HFA) 108 (90 BASE) MCG/ACT inhaler Inhale 1-2 puffs into the lungs every 6 (six) hours as needed for wheezing or shortness of breath. (Patient not taking: Reported on 12/11/2014) 18 g 5   No current facility-administered medications for this visit.    Allergies-reviewed and updated No Known Allergies  Social History   Social History  . Marital Status: Single    Spouse Name: N/A  . Number of Children: 1  . Years of Education: N/A   Occupational History  .  [employer]    Office supervisor   Social History Main Topics  . Smoking status: Never Smoker   . Smokeless tobacco: Never Used  . Alcohol Use: 0.0 oz/week    0 Standard drinks or equivalent per week     Comment: Occasional  . Drug Use: No  . Sexual Activity: No   Other Topics Concern  . None   Social History Narrative   Single. 24 year son in school at [employer] in 2016.       Works for [employer] at [employer] in [place] [employer]  Hobbies: enjoys going out to eat, travel     ROS--See HPI   Objective: BP 112/82 mmHg  Pulse 82  Temp(Src) 98.6 F (37 C)  Ht 5\' 8"  (1.727 m)  Wt 215 lb (97.523 kg)  BMI 32.70 kg/m2 Gen: NAD, resting comfortably HEENT: Mucous membranes are moist. Oropharynx normal Breasts: normal appearance, no masses or tenderness Pelvic: deferred Neck: no thyromegaly CV: RRR no murmurs rubs or gallops Lungs: CTAB no crackles, wheeze, rhonchi Abdomen: soft/nontender/nondistended/normal bowel sounds. No rebound or guarding.  Ext: no edema Skin: warm, dry Neuro: grossly normal, moves all extremities, PERRLA  Assessment/Plan:  44 y.o. female  presenting for annual physical.  Health Maintenance counseling: 1. Anticipatory guidance: Patient counseled regarding regular dental exams, eye exams, wearing seatbelts 2. Risk factor reduction:  Advised patient of need for regular exercise and diet rich and fruits and vegetables to reduce risk of heart attack and stroke.  3. Immunizations/screenings/ancillary studies- already received flu.  4. Cervical cancer screening- last 12/15/12 normal. Due 1 year Hysterectomy due to fibroids 5. Breast cancer screening-  breast exam today normal and mammogram 12/28/13 normal, repeat this month 6. Colon cancer screening - no family history- start age 27  6 months BP and ADD eval  Orders Placed This Encounter  Procedures  . CBC    [place]  . Comprehensive metabolic panel    [place]    Order Specific Question:  Has the patient fasted?    Answer:  No  . Lipid panel    [place]    Order Specific Question:  Has the patient fasted?    Answer:  No  . TSH    [place]    Meds ordered this encounter  Medications  . amphetamine-dextroamphetamine (ADDERALL XR) 20 MG 24 hr capsule    Sig: Take 1 capsule (20 mg total) by mouth every morning.    Dispense:  90 capsule    Refill:  0  . lisinopril-hydrochlorothiazide (ZESTORETIC) 20-12.5 MG tablet    Sig: Take 1 tablet by mouth daily.    Dispense:  90 tablet    Refill:  3

## 2014-12-12 ENCOUNTER — Encounter: Payer: Self-pay | Admitting: Family Medicine

## 2014-12-12 MED ORDER — ACYCLOVIR 400 MG PO TABS: 400.0000 mg | ORAL_TABLET | Freq: Every day | ORAL | Status: DC

## 2014-12-12 MED ORDER — ACYCLOVIR-HYDROCORTISONE 5-1 % EX CREA: TOPICAL_CREAM | CUTANEOUS | Status: DC

## 2014-12-31 ENCOUNTER — Ambulatory Visit
Admission: RE | Admit: 2014-12-31 | Discharge: 2014-12-31 | Disposition: A | Discharge status: Home / Self Care | Payer: 59 | Source: Ambulatory Visit

## 2014-12-31 DIAGNOSIS — Z1231 Encounter for screening mammogram for malignant neoplasm of breast: Secondary | ICD-10-CM

## 2015-04-11 ENCOUNTER — Other Ambulatory Visit: Payer: Self-pay | Admitting: Family Medicine

## 2015-04-14 ENCOUNTER — Other Ambulatory Visit: Payer: Self-pay

## 2015-04-14 MED ORDER — AMPHETAMINE-DEXTROAMPHET ER 20 MG PO CP24
20.0000 mg | ORAL_CAPSULE | ORAL | Status: DC
Start: 2015-04-14 — End: 2015-08-01

## 2015-04-15 MED FILL — DEXTROAMP-AMPHET ER 20 MG C: 20 | 90 days supply | Qty: 90 | Fill #0

## 2015-04-29 MED FILL — LISINOPRIL-HCTZ 20-12.5 MG: 20-12.5 | 90 days supply | Qty: 90 | Fill #3

## 2015-05-02 ENCOUNTER — Ambulatory Visit (INDEPENDENT_AMBULATORY_CARE_PROVIDER_SITE_OTHER): Payer: 59 | Admitting: Family Medicine

## 2015-05-02 ENCOUNTER — Encounter: Payer: Self-pay | Admitting: Family Medicine

## 2015-05-02 VITALS — BP 120/80 | HR 70 | Temp 98.3°F | Wt 224.0 lb

## 2015-05-02 DIAGNOSIS — I10 Essential (primary) hypertension: Secondary | ICD-10-CM

## 2015-05-02 DIAGNOSIS — F988 Other specified behavioral and emotional disorders with onset usually occurring in childhood and adolescence: Secondary | ICD-10-CM

## 2015-05-02 DIAGNOSIS — F909 Attention-deficit hyperactivity disorder, unspecified type: Secondary | ICD-10-CM | POA: Diagnosis not present

## 2015-05-02 MED ORDER — AMPHETAMINE-DEXTROAMPHETAMINE 10 MG PO TABS: ORAL_TABLET | ORAL | Status: DC

## 2015-05-02 NOTE — Progress Notes (Signed)
Pre visit review using our clinic review tool, if applicable. No additional management support is needed unless otherwise documented below in the visit note. 

## 2015-05-02 NOTE — Assessment & Plan Note (Signed)
S: well controlled with adderall XR 20mg . Has extra 10mg  on hand if needed for school or studying but recently ran out.  A/P: refilled 10mg  dose #90 as has more regular studying needed. Follow up 6 months, refill by phone in 3. Controlled substance contract at follow up. Having some difficulty sleeping due to evening dose- advised consider reducing studying load short term as also doing some stress eating and weight up 9 lbs. Extensive counseling on interconnected problems.

## 2015-05-02 NOTE — Patient Instructions (Signed)
Refilled 10mg  adderall short acting. May use in afternoon when working on school or study.   Left hand pain- would trial ice 20 minutes 3x a day and aleve twice a day for 3 days to try to calm down. Could be a strain or perhaps some early arthritis  Consider massage for mid back pain- if worsens or enlarges then reevaluate.   Blood pressure looks great  Wt Readings from Last 3 Encounters:  05/02/15 224 lb (101.606 kg)  12/11/14 215 lb (97.523 kg)  06/13/14 216 lb (97.977 kg)  need to find way to reverse this trend- I think stress is biggest driver

## 2015-05-02 NOTE — Progress Notes (Signed)
Garret Reddish, MD  Subjective:  Candice Parker is a 45 y.o. year old very pleasant female patient who presents for/with See problem oriented charting ROS- No chest pain or shortness of breath. No headache or blurry vision.  No palpitations.   Past Medical History-  Patient Active Problem List   Diagnosis Date Noted  . ADD (attention deficit disorder) 06/13/2014    Priority: High  . Left knee pain 06/13/2014    Priority: Medium  . HTN (hypertension) 07/17/2010    Priority: Medium  . Left elbow pain 06/13/2014    Priority: Low  . Herpes simplex 06/13/2014    Priority: Low  . Chest pain, atypical 04/11/2011    Priority: Low  . Intermittent memory loss 02/18/2011    Priority: Low  . Obesity 12/10/2010    Priority: Low  . Asthma 01/29/2009    Priority: Low    Medications- reviewed and updated Current Outpatient Prescriptions  Medication Sig Dispense Refill  . acyclovir (ZOVIRAX) 400 MG tablet Take 1 tablet (400 mg total) by mouth 5 (five) times daily. For 5 days- take at first sign of cold sore 30 tablet 2  . Acyclovir-Hydrocortisone 5-1 % CREA Apply 5 times/day for 5 days 5 g 2  . albuterol (PROVENTIL HFA;VENTOLIN HFA) 108 (90 BASE) MCG/ACT inhaler Inhale 1-2 puffs into the lungs every 6 (six) hours as needed for wheezing or shortness of breath. 18 g 5  . amphetamine-dextroamphetamine (ADDERALL XR) 20 MG 24 hr capsule Take 1 capsule (20 mg total) by mouth every morning. 90 capsule 0  . ibuprofen (ADVIL,MOTRIN) 200 MG tablet Take 400 mg by mouth every 6 (six) hours as needed.    Marland Kitchen lisinopril-hydrochlorothiazide (ZESTORETIC) 20-12.5 MG tablet Take 1 tablet by mouth daily. 90 tablet 3  . Multiple Vitamin (MULTIVITAMIN) tablet Take 1 tablet by mouth daily. Women's One-A-Day    . amphetamine-dextroamphetamine (ADDERALL) 10 MG tablet May take dose if needed in afternoon (for school and studying) after morning extended release dose (for work) 90 tablet 0   No current  facility-administered medications for this visit.    Objective: BP 120/80 mmHg  Pulse 70  Temp(Src) 98.3 F (36.8 C) (Oral)  Wt 224 lb (101.606 kg) Gen: NAD, resting comfortably CV: RRR no murmurs rubs or gallops Lungs: CTAB no crackles, wheeze, rhonchi Abdomen: soft/nontender/nondistended/normal bowel sounds. obese Ext: no edema Left hand- some pain with palpation of carpal bones. Negative tinels and phalens.  Mid back- slight asymmetry on right to mid back compared to left- pain to palpation in Paraspinous muscles and tight Skin: warm, dry Neuro: grossly normal, moves all extremities  Assessment/Plan:  ADD (attention deficit disorder) S: well controlled with adderall XR 20mg . Has extra 10mg  on hand if needed for school or studying but recently ran out.  A/P: refilled 10mg  dose #90 as has more regular studying needed. Follow up 6 months, refill by phone in 3. Controlled substance contract at follow up. Having some difficulty sleeping due to evening dose- advised consider reducing studying load short term as also doing some stress eating and weight up 9 lbs. Extensive counseling on interconnected problems.   Hypertension S: controlled. On lisinopril-hctz 20-12.5mg   BP Readings from Last 3 Encounters:  05/02/15 120/80  12/11/14 112/82  06/13/14 118/82  A/P:Continue current medications, excellent control- watch weight  Also has some minor complains of mild to moderate Left dorsal hand pain for 1 day- not in her digits- some pain in carpal bones with palpation. Discussed icing and aleve  for 3 day trial. Follow up if not improving. Has some mid to right back pain mild to moderate. Feels like small tissue enlargement in area personally and on my exam- does have some muscle tension- we discussed massage.   We also discussed reducing stress and taking time for herself. Stress level is leading to weight gain through increased unhealthy foods  Return in about 6 months (around 11/02/2015)  for physical. Return precautions advised.   Meds ordered this encounter  Medications  . amphetamine-dextroamphetamine (ADDERALL) 10 MG tablet    Sig: May take dose if needed in afternoon (for school and studying) after morning extended release dose (for work)    Dispense:  90 tablet    Refill:  0   The duration of face-to-face time during this visit was 25 minutes. Greater than 50% of this time was spent in counseling, explanation of diagnosis, planning of further management, and/or coordination of care.

## 2015-05-06 ENCOUNTER — Ambulatory Visit: Payer: Self-pay | Admitting: Family Medicine

## 2015-05-08 ENCOUNTER — Ambulatory Visit: Payer: 59 | Admitting: Family Medicine

## 2015-07-30 ENCOUNTER — Other Ambulatory Visit: Payer: Self-pay | Admitting: Family Medicine

## 2015-07-30 MED FILL — LISINOPRIL-HCTZ 20-12.5 MG: 20-12.5 | 90 days supply | Qty: 90 | Fill #0

## 2015-08-01 ENCOUNTER — Other Ambulatory Visit: Payer: Self-pay

## 2015-08-01 MED ORDER — AMPHETAMINE-DEXTROAMPHETAMINE 10 MG PO TABS: ORAL_TABLET | ORAL | Status: DC

## 2015-08-01 MED ORDER — AMPHETAMINE-DEXTROAMPHET ER 20 MG PO CP24: 20.0000 mg | ORAL_CAPSULE | ORAL | Status: DC

## 2015-08-01 MED FILL — DEXTROAMP-AMPHET ER 20 MG C: 20 | 90 days supply | Qty: 90 | Fill #0

## 2015-08-01 MED FILL — DEXTROAMP-AMP 10 MG TAB: 10 | 90 days supply | Qty: 90 | Fill #0

## 2015-08-11 ENCOUNTER — Other Ambulatory Visit: Payer: Self-pay

## 2015-08-11 MED ORDER — LISINOPRIL-HYDROCHLOROTHIAZIDE 20-12.5 MG PO TABS: 1.0000 | ORAL_TABLET | Freq: Every day | ORAL | Status: DC

## 2015-10-01 ENCOUNTER — Encounter: Payer: Self-pay | Admitting: Family Medicine

## 2015-11-17 ENCOUNTER — Other Ambulatory Visit: Payer: Self-pay | Admitting: Family Medicine

## 2015-11-18 MED FILL — LISINOPRIL-HCTZ 20-12.5 MG: 20-12.5 | 90 days supply | Qty: 90 | Fill #1

## 2015-11-25 ENCOUNTER — Ambulatory Visit (INDEPENDENT_AMBULATORY_CARE_PROVIDER_SITE_OTHER): Payer: 59 | Admitting: Family Medicine

## 2015-11-25 ENCOUNTER — Encounter: Payer: Self-pay | Admitting: Family Medicine

## 2015-11-25 VITALS — BP 132/89 | HR 96 | Temp 98.1°F | Wt 228.8 lb

## 2015-11-25 DIAGNOSIS — F988 Other specified behavioral and emotional disorders with onset usually occurring in childhood and adolescence: Secondary | ICD-10-CM | POA: Diagnosis not present

## 2015-11-25 DIAGNOSIS — I10 Essential (primary) hypertension: Secondary | ICD-10-CM | POA: Diagnosis not present

## 2015-11-25 MED ORDER — AMPHETAMINE-DEXTROAMPHET ER 20 MG PO CP24: 20.0000 mg | ORAL_CAPSULE | ORAL | 0 refills | Status: DC

## 2015-11-25 NOTE — Patient Instructions (Signed)
Call in 3 months, see [place] in 6 months for physical  Weight up - likely some plantar fasciitis in heel. Could do perhaps 7 days of aleve twice a day, heel cups, work on weight loss again (which will be hard with all you have on you)  Several stress related symptoms- I hope you can find some time for yourself

## 2015-11-25 NOTE — Assessment & Plan Note (Signed)
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

## 2015-11-25 NOTE — Progress Notes (Signed)
Pre visit review using our clinic review tool, if applicable. No additional management support is needed unless otherwise documented below in the visit note. 

## 2015-11-25 NOTE — Progress Notes (Signed)
Subjective:  Candice Parker is a 45 y.o. year old very pleasant female patient who presents for/with See problem oriented charting ROS- No chest pain or shortness of breath.  No palpitations. Mild headaches due to stress at times- at end of long day screen may get slightly blurred but better if stops looking at screen. see any ROS included in HPI as well.   Past Medical History-  Patient Active Problem List   Diagnosis Date Noted  . ADD (attention deficit disorder) 06/13/2014    Priority: High  . Left knee pain 06/13/2014    Priority: Medium  . HTN (hypertension) 07/17/2010    Priority: Medium  . Left elbow pain 06/13/2014    Priority: Low  . Herpes simplex 06/13/2014    Priority: Low  . Chest pain, atypical 04/11/2011    Priority: Low  . Intermittent memory loss 02/18/2011    Priority: Low  . Obesity 12/10/2010    Priority: Low  . Asthma 01/29/2009    Priority: Low    Medications- reviewed and updated Current Outpatient Prescriptions  Medication Sig Dispense Refill  . acyclovir (ZOVIRAX) 400 MG tablet Take 1 tablet (400 mg total) by mouth 5 (five) times daily. For 5 days- take at first sign of cold sore 30 tablet 2  . Acyclovir-Hydrocortisone 5-1 % CREA Apply 5 times/day for 5 days 5 g 2  . albuterol (PROVENTIL HFA;VENTOLIN HFA) 108 (90 BASE) MCG/ACT inhaler Inhale 1-2 puffs into the lungs every 6 (six) hours as needed for wheezing or shortness of breath. 18 g 5  . amphetamine-dextroamphetamine (ADDERALL XR) 20 MG 24 hr capsule Take 1 capsule (20 mg total) by mouth every morning. 90 capsule 0  . amphetamine-dextroamphetamine (ADDERALL) 10 MG tablet May take dose if needed in afternoon (for school and studying) after morning extended release dose (for work) 90 tablet 0  . ibuprofen (ADVIL,MOTRIN) 200 MG tablet Take 400 mg by mouth every 6 (six) hours as needed.    Marland Kitchen lisinopril-hydrochlorothiazide (ZESTORETIC) 20-12.5 MG tablet Take 1 tablet by mouth daily. 90 tablet 3  .  Multiple Vitamin (MULTIVITAMIN) tablet Take 1 tablet by mouth daily. Women's One-A-Day     No current facility-administered medications for this visit.     Objective: BP 132/89 (BP Location: Left Arm, Patient Position: Sitting, Cuff Size: Normal)   Pulse 96   Temp 98.1 F (36.7 C) (Oral)   Wt 228 lb 12.8 oz (103.8 kg)   SpO2 99%   BMI 34.79 kg/m  Gen: NAD, resting comfortably CV: RRR no murmurs rubs or gallops Lungs: CTAB no crackles, wheeze, rhonchi Abdomen: sounds. obese Ext: no edema Skin: warm, dry, no rash Neuro: grossly normal, moves all extremities   Assessment/Plan:  ADD (attention deficit disorder) S: reasonably controlled on Adderall XR 20mg .  extra 10mg  on hand if needed for late workday or for school. She uses it for school twice a week but has not had to use for work lately. Symptoms of inattention well controlled.  A/P: refilled medication- did not need extra 10mg  pills at this time. We discussed potential controlled substance contract at follow up but I have no concerns on her use of medication or filling patterns. 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 amphetamine-dextroamphetamine (ADDERALL XR) 20 MG 24 hr capsule    Sig: Take 1 capsule (20 mg total) by mouth every morning.    Dispense:  90 capsule    Refill:  0    Return precautions advised.  Garret Reddish, MD

## 2015-11-25 NOTE — Assessment & Plan Note (Signed)
S: reasonably controlled on Adderall XR 20mg .  extra 10mg  on hand if needed for late workday or for school. She uses it for school twice a week but has not had to use for work lately. Symptoms of inattention well controlled.  A/P: refilled medication- did not need extra 10mg  pills at this time. We discussed potential controlled substance contract at follow up but I have no concerns on her use of medication or filling patterns. Call in 3 , CPE in 6 months- will need pap as well

## 2015-12-01 MED FILL — DEXTROAMP-AMPHET ER 20 MG C: 20 | 90 days supply | Qty: 90 | Fill #0

## 2015-12-04 ENCOUNTER — Encounter: Payer: Self-pay | Admitting: Family Medicine

## 2016-02-18 ENCOUNTER — Other Ambulatory Visit: Payer: Self-pay | Admitting: Family Medicine

## 2016-02-18 DIAGNOSIS — Z1231 Encounter for screening mammogram for malignant neoplasm of breast: Secondary | ICD-10-CM

## 2016-03-17 ENCOUNTER — Ambulatory Visit
Admission: RE | Admit: 2016-03-17 | Discharge: 2016-03-17 | Disposition: A | Discharge status: Home / Self Care | Payer: 59 | Source: Ambulatory Visit | Attending: Family Medicine | Admitting: Family Medicine

## 2016-03-17 DIAGNOSIS — Z1231 Encounter for screening mammogram for malignant neoplasm of breast: Secondary | ICD-10-CM | POA: Diagnosis not present

## 2016-03-28 ENCOUNTER — Other Ambulatory Visit: Payer: Self-pay | Admitting: Family Medicine

## 2016-03-29 ENCOUNTER — Other Ambulatory Visit: Payer: Self-pay

## 2016-03-29 MED ORDER — AMPHETAMINE-DEXTROAMPHET ER 20 MG PO CP24: 20.0000 mg | ORAL_CAPSULE | ORAL | 0 refills | Status: DC

## 2016-03-31 MED FILL — LISINOPRIL-HCTZ 20-12.5 MG: 20-12.5 | 90 days supply | Qty: 90 | Fill #0

## 2016-04-07 MED FILL — ADDERALL XR 20 MG CAP SA: 20 | 90 days supply | Qty: 90 | Fill #0

## 2016-05-12 ENCOUNTER — Encounter: Payer: 59 | Admitting: Family Medicine

## 2016-05-13 ENCOUNTER — Encounter: Payer: 59 | Admitting: Family Medicine

## 2016-07-22 ENCOUNTER — Encounter: Payer: Self-pay | Admitting: Family Medicine

## 2016-07-23 MED FILL — LISINOPRIL-HCTZ 20-12.5 MG: 20-12.5 | 90 days supply | Qty: 90 | Fill #1

## 2016-08-10 ENCOUNTER — Encounter: Payer: Self-pay | Admitting: Family Medicine

## 2016-08-10 ENCOUNTER — Ambulatory Visit (INDEPENDENT_AMBULATORY_CARE_PROVIDER_SITE_OTHER): Payer: 59 | Admitting: Family Medicine

## 2016-08-10 ENCOUNTER — Other Ambulatory Visit (HOSPITAL_COMMUNITY)
Admission: RE | Admit: 2016-08-10 | Discharge: 2016-08-10 | Disposition: A | Discharge status: Home / Self Care | Payer: 59 | Source: Ambulatory Visit | Attending: Family Medicine | Admitting: Family Medicine

## 2016-08-10 ENCOUNTER — Other Ambulatory Visit: Payer: Self-pay

## 2016-08-10 VITALS — BP 118/78 | HR 90 | Temp 98.0°F | Resp 18 | Ht 69.0 in | Wt 229.0 lb

## 2016-08-10 DIAGNOSIS — N760 Acute vaginitis: Secondary | ICD-10-CM | POA: Insufficient documentation

## 2016-08-10 DIAGNOSIS — Z124 Encounter for screening for malignant neoplasm of cervix: Secondary | ICD-10-CM

## 2016-08-10 DIAGNOSIS — F988 Other specified behavioral and emotional disorders with onset usually occurring in childhood and adolescence: Secondary | ICD-10-CM | POA: Diagnosis not present

## 2016-08-10 DIAGNOSIS — R0789 Other chest pain: Secondary | ICD-10-CM

## 2016-08-10 DIAGNOSIS — R079 Chest pain, unspecified: Secondary | ICD-10-CM

## 2016-08-10 DIAGNOSIS — B9689 Other specified bacterial agents as the cause of diseases classified elsewhere: Secondary | ICD-10-CM | POA: Insufficient documentation

## 2016-08-10 DIAGNOSIS — I10 Essential (primary) hypertension: Secondary | ICD-10-CM

## 2016-08-10 DIAGNOSIS — Z0001 Encounter for general adult medical examination with abnormal findings: Secondary | ICD-10-CM

## 2016-08-10 DIAGNOSIS — Z0000 Encounter for general adult medical examination without abnormal findings: Secondary | ICD-10-CM

## 2016-08-10 MED ORDER — AMPHETAMINE-DEXTROAMPHET ER 20 MG PO CP24: 20.0000 mg | ORAL_CAPSULE | ORAL | 0 refills | Status: DC

## 2016-08-10 NOTE — Progress Notes (Signed)
Phone: [phone]  Subjective:  Patient presents today for their annual physical. Chief complaint-noted.   See problem oriented charting- ROS- full  review of systems was completed and negative except for: Complains of left upper chest pain and arm pain. Also has had some shortness of breath with activity  The following were reviewed and entered/updated in epic: Past Medical History:  Diagnosis Date  . Acid reflux 12/10/2010  . ASTHMA, INTRINSIC 01/29/2009  . Breast lesion 09/29/2011  . Hypertension   . Overweight(278.02) 12/10/2010   Patient Active Problem List   Diagnosis Date Noted  . Attention deficit disorder (ADD) in adult 06/13/2014    Priority: High  . Left knee pain 06/13/2014    Priority: Medium  . HTN (hypertension) 07/17/2010    Priority: Medium  . Left elbow pain 06/13/2014    Priority: Low  . Herpes simplex 06/13/2014    Priority: Low  . Chest pain, atypical 04/11/2011    Priority: Low  . Intermittent memory loss 02/18/2011    Priority: Low  . Obesity 12/10/2010    Priority: Low  . Asthma 01/29/2009    Priority: Low   Past Surgical History:  Procedure Laterality Date  . ELBOW FRACTURE SURGERY    . INNER EAR SURGERY    . TOTAL ABDOMINAL HYSTERECTOMY     fibroid uterus.    Family History  Problem Relation Age of Onset  . Hypertension Mother   . Hyperlipidemia Mother   . Heart failure Father        drug induced  . Kidney disease Father        failure  . Breast cancer Paternal Grandmother   . Breast cancer Maternal Grandmother   . Hypertension Brother     Medications- reviewed and updated Current Outpatient Prescriptions  Medication Sig Dispense Refill  . acyclovir (ZOVIRAX) 400 MG tablet Take 1 tablet (400 mg total) by mouth 5 (five) times daily. For 5 days- take at first sign of cold sore 30 tablet 2  . Acyclovir-Hydrocortisone 5-1 % CREA Apply 5 times/day for 5 days 5 g 2  . albuterol (PROVENTIL HFA;VENTOLIN HFA) 108 (90 BASE) MCG/ACT  inhaler Inhale 1-2 puffs into the lungs every 6 (six) hours as needed for wheezing or shortness of breath. 18 g 5  . ibuprofen (ADVIL,MOTRIN) 200 MG tablet Take 400 mg by mouth every 6 (six) hours as needed.    Marland Kitchen lisinopril-hydrochlorothiazide (ZESTORETIC) 20-12.5 MG tablet Take 1 tablet by mouth daily. 90 tablet 3  . amphetamine-dextroamphetamine (ADDERALL XR) 20 MG 24 hr capsule Take 1 capsule (20 mg total) by mouth every morning. 90 capsule 0  . Multiple Vitamin (MULTIVITAMIN) tablet Take 1 tablet by mouth daily. Women's One-A-Day     No current facility-administered medications for this visit.     Allergies-reviewed and updated No Known Allergies  Social History   Social History  . Marital status: Single    Spouse name: N/A  . Number of children: 1  . Years of education: N/A   Occupational History  .  [employer]    Office supervisor   Social History Main Topics  . Smoking status: Never Smoker  . Smokeless tobacco: Never Used  . Alcohol use 0.0 oz/week     Comment: Occasional  . Drug use: No  . Sexual activity: No   Other Topics Concern  . None   Social History Narrative   Single. 95 year son in school at [employer] in 2018- doing [place] type work still wants  to play basketball overseas.       Works for [employer] at [employer] in [place]: enjoys going out to eat, travel    Objective: BP 118/78 (BP Location: Left Arm, Patient Position: Sitting, Cuff Size: Large)   Pulse 90   Temp 98 F (36.7 C) (Oral)   Resp 18   Ht 5\' 9"  (1.753 m)   Wt 229 lb (103.9 kg)   SpO2 99%   BMI 33.82 kg/m  Gen: NAD, resting comfortably HEENT: Mucous membranes are moist. Oropharynx normal Neck: no thyromegaly CV: RRR no murmurs rubs or gallops Lungs: CTAB no crackles, wheeze, rhonchi Abdomen: soft/nontender/nondistended/normal bowel sounds. No rebound or guarding.  Ext: no edema Skin: warm, dry Neuro: grossly  normal, moves all extremities, PERRLA  Breasts: normal appearance, no masses or tenderness Pelvic: cervix normal in appearance, external genitalia normal, no adnexal masses or tenderness, no cervical motion tenderness, rectovaginal septum normal, uterus surgically absent and some creamy white discharge (light amount noted)   Assessment/Plan:  46 y.o. female presenting for annual physical.  Health Maintenance counseling: 1. Anticipatory guidance: Patient counseled regarding regular dental exams q6 months, eye exams - yearly, wearing seatbelts.  2. Risk factor reduction:  Advised patient of need for regular exercise and diet rich and fruits and vegetables to reduce risk of heart attack and stroke. Exercise- no time between school and work- advised to find time. Diet-eats very poorly- likely low metabolism as not even eating at times/skipping meals.  Wt Readings from Last 3 Encounters:  08/10/16 229 lb (103.9 kg)  11/25/15 228 lb 12.8 oz (103.8 kg)  05/02/15 224 lb (101.6 kg)  3. Immunizations/screenings/ancillary studies- up to date.  Immunization History  Administered Date(s) Administered  . Influenza-Unspecified 11/21/2013, 11/23/2014, 11/24/2015  . Td 10/02/2009  . Tdap 06/12/2015  4. Cervical cancer screening- 12/15/12 normal- due today and completed. hysterectomy due to fibroids.  5. Breast cancer screening-  breast exam today normal today and mammogram 03/17/16.  6. Colon cancer screening - no family history- start between 65-50- discuss next year  Status of chronic or acute concerns   HTN- controlled on lisinopril-hctz 20-12.5mg   ADD- uses Adderall 20mg  XR for work. Previously Had additional 10mg  IR tablet for school and studying - not routinely using. Will remove from her medication list  Had stopped both her Adderall and Bp meds for a month or so. Body did not react well- headaches and dizziness- better back on meds  Herpes simplex- breaks out on nose- has acyclovir  available  Some discharge- will get BV, yeast, trichomonas testing though suspect may just be physiological discharge. She states perhaps has had slight odor.   Chest pain, atypical S:Left sided chest pain- upper chest and pain shoots down her arm and down into her elbow. Off and on for 2 months. Usually 3 days a week. Lasts for a few hours- rest at home seems to help. At work tries to relax and eventually helps. Lasts 2 times mowing the lawn felt particularly short of breath- usually no issues at all. She denies wheezing with this.   Had been seen by cardiology in 2013 and had a reassuring echocardiogram. A/P:Refer to cardiology especially with development of worsening shortness of breath. I do think this is likely stress related. She has had prior workup including echocardiogram in 2013. I think repeat evaluation as needed due to pain in the left arm as well as the  worsening shortness of breath. Considered doing chest x-ray today but ultimately did not pursue. Apparently had an ekg at her office- faxed here but I could not read well. 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 CBC    Standing Status:   Future    Standing Expiration Date:   08/10/2017  . Comprehensive metabolic panel    [place]    Standing Status:   Future    Standing Expiration Date:   08/10/2017  . Lipid panel    Standing Status:   Future    Standing Expiration Date:   08/10/2017  . Ambulatory referral to Cardiology    Referral Priority:   Routine    Referral Type:   Consultation    Referral Reason:   Specialty Services Required    Referred to Provider:   Lelon Perla, MD    Requested Specialty:   Cardiology    Number of Visits Requested:   1  . POCT Urinalysis Dipstick  (Automated)    Standing Status:   Future    Standing Expiration Date:   08/10/2017    Return precautions advised. Particularly emergent precautions in regards to her chest pain Garret Reddish, MD

## 2016-08-10 NOTE — Patient Instructions (Signed)
We will call you within a week or two about your referral to cardiology with Dr. Stanford Breed. If you do not hear within 3 weeks, give [place] a call.   Hold off on adderall until you see Dr. Stanford Breed  I will send you a message and you can schedule labs at one of your offices after I get these put in

## 2016-08-10 NOTE — Assessment & Plan Note (Addendum)
S:Left sided chest pain- upper chest and pain shoots down her arm and down into her elbow. Off and on for 2 months. Usually 3 days a week. Lasts for a few hours- rest at home seems to help. At work tries to relax and eventually helps. Lasts 2 times mowing the lawn felt particularly short of breath- usually no issues at all. She denies wheezing with this.   Had been seen by cardiology in 2013 and had a reassuring echocardiogram. A/P:Refer to cardiology especially with development of worsening shortness of breath. I do think this is likely stress related. She has had prior workup including echocardiogram in 2013. I think repeat evaluation as needed due to pain in the left arm as well as the worsening shortness of breath. Considered doing chest x-ray today but ultimately did not pursue. Apparently had an ekg at her office- faxed here but I could not read well. 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

## 2016-08-12 LAB — CYTOLOGY - PAP
Adequacy: ABSENT
Bacterial vaginitis: POSITIVE — AB
Candida vaginitis: NEGATIVE
DIAGNOSIS: NEGATIVE
TRICH (WINDOWPATH): NEGATIVE

## 2016-08-12 NOTE — Progress Notes (Signed)
Referring-Stephen Kristian Covey, M.D. Reason for referral-Chest Pain  HPI: 46 year old female for evaluation of chest pain at the request of Marin Olp M.D. I saw this patient previously but not since March 2013. Echocardiogram April 2013 showed normal LV systolic function. Patient describes pain in her left upper chest, shoulder and left upper extremity under stressful situations. Her symptoms last approximately 20 minutes and resolved. The symptoms are not pleuritic or related to movement. No associated symptoms. She otherwise denies dyspnea on exertion, orthopnea, PND, pedal edema, exertional chest pain or syncope. Because of the above we were asked to evaluate.  Current Outpatient Prescriptions  Medication Sig Dispense Refill  . acyclovir (ZOVIRAX) 400 MG tablet Take 1 tablet (400 mg total) by mouth 5 (five) times daily. For 5 days- take at first sign of cold sore 30 tablet 2  . Acyclovir-Hydrocortisone 5-1 % CREA Apply 5 times/day for 5 days 5 g 2  . albuterol (PROVENTIL HFA;VENTOLIN HFA) 108 (90 BASE) MCG/ACT inhaler Inhale 1-2 puffs into the lungs every 6 (six) hours as needed for wheezing or shortness of breath. 18 g 5  . amphetamine-dextroamphetamine (ADDERALL XR) 20 MG 24 hr capsule Take 1 capsule (20 mg total) by mouth every morning. 90 capsule 0  . ibuprofen (ADVIL,MOTRIN) 200 MG tablet Take 400 mg by mouth every 6 (six) hours as needed.    Marland Kitchen lisinopril-hydrochlorothiazide (ZESTORETIC) 20-12.5 MG tablet Take 1 tablet by mouth daily. 90 tablet 3  . Multiple Vitamin (MULTIVITAMIN) tablet Take 1 tablet by mouth daily. Women's One-A-Day     No current facility-administered medications for this visit.     No Known Allergies   Past Medical History:  Diagnosis Date  . Acid reflux 12/10/2010  . ASTHMA, INTRINSIC 01/29/2009  . Breast lesion 09/29/2011  . Hypertension   . Overweight(278.02) 12/10/2010    Past Surgical History:  Procedure Laterality Date  . ELBOW FRACTURE SURGERY     . INNER EAR SURGERY    . TOTAL ABDOMINAL HYSTERECTOMY     fibroid uterus.    Social History   Social History  . Marital status: Single    Spouse name: N/A  . Number of children: 1  . Years of education: N/A   Occupational History  .  [employer]    Office supervisor   Social History Main Topics  . Smoking status: Never Smoker  . Smokeless tobacco: Never Used  . Alcohol use 0.0 oz/week     Comment: Occasional  . Drug use: No  . Sexual activity: No   Other Topics Concern  . Not on file   Social History Narrative   Single. 35 year son in school at [employer] in 2018- doing [place] type work still wants to play basketball overseas.       Works for [employer] at [employer] in school- finishing up [place]: enjoys going out to eat, travel     Family History  Problem Relation Age of Onset  . Hypertension Mother   . Hyperlipidemia Mother   . Heart failure Father        drug induced  . Kidney disease Father        failure  . Breast cancer Paternal Grandmother   . Breast cancer Maternal Grandmother   . Hypertension Brother     ROS: no fevers or chills, productive cough, hemoptysis, dysphasia, odynophagia, melena, hematochezia, dysuria, hematuria, rash, seizure activity, orthopnea, PND, pedal edema,  claudication. Remaining systems are negative.  Physical Exam:   Blood pressure 132/80, pulse 79, height 5\' 9"  (1.753 m), weight 104.8 kg (231 lb).  General:  Well developed/well nourished in NAD Skin warm/dry Patient not depressed No peripheral clubbing Back-normal HEENT-normal/normal eyelids Neck supple/normal carotid upstroke bilaterally; no bruits; no JVD; no thyromegaly chest - CTA/ normal expansion CV - RRR/normal S1 and S2; no murmurs, rubs or gallops;  PMI nondisplaced Abdomen -NT/ND, no HSM, no mass, + bowel sounds, no bruit 2+ femoral pulses, no bruits Ext-no edema, chords, 2+ DP Neuro-grossly nonfocal  ECG -  Sinus rhythm at a rate of 79. Cannot rule out prior inferior infarct. personally reviewed  A/P  1 chest pain-symptoms are atypical. Electrocardiogram shows no ST changes. I will arrange an echocardiogram to assess LV function and wall motion. Schedule exercise treadmill for risk stratification.  2 hypertension-blood pressure is controlled. Continue present medications.  3 obesity-we discussed the importance of exercise and weight loss.   Kirk Ruths, MD

## 2016-08-13 ENCOUNTER — Other Ambulatory Visit: Payer: Self-pay | Admitting: Family Medicine

## 2016-08-13 MED ORDER — METRONIDAZOLE 500 MG PO TABS: 500.0000 mg | ORAL_TABLET | Freq: Two times a day (BID) | ORAL | 0 refills | Status: DC

## 2016-08-13 MED FILL — metroNIDAZOLE 500 MG TABS: 500 | 7 days supply | Qty: 14 | Fill #0

## 2016-08-23 ENCOUNTER — Encounter: Payer: Self-pay | Admitting: Family Medicine

## 2016-08-24 ENCOUNTER — Encounter: Payer: Self-pay | Admitting: Cardiology

## 2016-08-24 ENCOUNTER — Ambulatory Visit (INDEPENDENT_AMBULATORY_CARE_PROVIDER_SITE_OTHER): Payer: 59 | Admitting: Cardiology

## 2016-08-24 VITALS — BP 132/80 | HR 79 | Ht 69.0 in | Wt 231.0 lb

## 2016-08-24 DIAGNOSIS — R072 Precordial pain: Secondary | ICD-10-CM | POA: Diagnosis not present

## 2016-08-24 DIAGNOSIS — R0602 Shortness of breath: Secondary | ICD-10-CM

## 2016-08-24 DIAGNOSIS — I10 Essential (primary) hypertension: Secondary | ICD-10-CM | POA: Diagnosis not present

## 2016-08-24 NOTE — Patient Instructions (Signed)
Medication Instructions:   NO CHANGE  Testing/Procedures:  Your physician has requested that you have an echocardiogram. Echocardiography is a painless test that uses sound waves to create images of your heart. It provides your doctor with information about the size and shape of your heart and how well your heart's chambers and valves are working. This procedure takes approximately one hour. There are no restrictions for this procedure.SCHEDULE IN THE [place] [facility]  Your physician has requested that you have an exercise tolerance test. For further information please visit [email or web address]. Please also follow instruction sheet, as given.SCHEDULE IN [place]     Follow-Up:  Your physician recommends that you schedule a follow-up appointment in: AS NEEDED    Exercise Stress Electrocardiogram An exercise stress electrocardiogram is a test that is done to evaluate the blood supply to your heart. This test may also be called exercise stress electrocardiography. The test is done while you are walking on a treadmill. The goal of this test is to raise your heart rate. This test is done to find areas of poor blood flow to the heart by determining the extent of coronary artery disease (CAD). CAD is defined as narrowing in one or more heart (coronary) arteries of more than 70%. If you have an abnormal test result, this may mean that you are not getting adequate blood flow to your heart during exercise. Additional testing may be needed to understand why your test was abnormal. Tell a health care provider about:  Any allergies you have.  All medicines you are taking, including vitamins, herbs, eye drops, creams, and over-the-counter medicines.  Any problems you or family members have had with anesthetic medicines.  Any blood disorders you have.  Any surgeries you have had.  Any medical conditions you have.  Possibility of pregnancy, if this applies. What are the risks? Generally, this is a  safe procedure. However, as with any procedure, complications can occur. Possible complications can include:  Pain or pressure in the following areas: ? Chest. ? Jaw or neck. ? Between your shoulder blades. ? Radiating down your left arm.  Dizziness or light-headedness.  Shortness of breath.  Increased or irregular heartbeats.  Nausea or vomiting.  Heart attack (rare).  What happens before the procedure?  Avoid all forms of caffeine 24 hours before your test or as directed by your health care provider. This includes coffee, tea (even decaffeinated tea), caffeinated sodas, chocolate, cocoa, and certain pain medicines.  Follow your health care provider's instructions regarding eating and drinking before the test.  Take your medicines as directed at regular times with water unless instructed otherwise. Exceptions may include: ? If you have diabetes, ask how you are to take your insulin or pills. It is common to adjust insulin dosing the morning of the test. ? If you are taking beta-blocker medicines, it is important to talk to your health care provider about these medicines well before the date of your test. Taking beta-blocker medicines may interfere with the test. In some cases, these medicines need to be changed or stopped 24 hours or more before the test. ? If you wear a nitroglycerin patch, it may need to be removed prior to the test. Ask your health care provider if the patch should be removed before the test.  If you use an inhaler for any breathing condition, bring it with you to the test.  If you are an outpatient, bring a snack so you can eat right after the stress phase of the test.  Do not smoke for 4 hours prior to the test or as directed by your health care provider.  Do not apply lotions, powders, creams, or oils on your chest prior to the test.  Wear loose-fitting clothes and comfortable shoes for the test. This test involves walking on a treadmill. What happens  during the procedure?  Multiple patches (electrodes) will be put on your chest. If needed, small areas of your chest may have to be shaved to get better contact with the electrodes. Once the electrodes are attached to your body, multiple wires will be attached to the electrodes and your heart rate will be monitored.  Your heart will be monitored both at rest and while exercising.  You will walk on a treadmill. The treadmill will be started at a slow pace. The treadmill speed and incline will gradually be increased to raise your heart rate. What happens after the procedure?  Your heart rate and blood pressure will be monitored after the test.  You may return to your normal schedule including diet, activities, and medicines, unless your health care provider tells you otherwise. This information is not intended to replace advice given to you by your health care provider. Make sure you discuss any questions you have with your health care provider. Document Released: 01/23/2000 Document Revised: 07/03/2015 Document Reviewed: 10/02/2012 Elsevier Interactive Patient Education  2017 [employer].

## 2016-08-25 ENCOUNTER — Ambulatory Visit (INDEPENDENT_AMBULATORY_CARE_PROVIDER_SITE_OTHER): Payer: 59

## 2016-08-25 ENCOUNTER — Other Ambulatory Visit: Payer: Self-pay

## 2016-08-25 DIAGNOSIS — R0602 Shortness of breath: Secondary | ICD-10-CM | POA: Diagnosis not present

## 2016-08-25 DIAGNOSIS — R072 Precordial pain: Secondary | ICD-10-CM | POA: Diagnosis not present

## 2016-08-25 LAB — ECHOCARDIOGRAM COMPLETE
CHL CUP RV SYS PRESS: 9 mmHg
CHL CUP TV REG PEAK VELOCITY: 124 cm/s
E/e' ratio: 9.21
EWDT: 164 ms
FS: 37 % (ref 28–44)
IV/PV OW: 0.98
LA vol index: 20 mL/m2
LA vol: 44.1 mL
LADIAMINDEX: 1.59 cm/m2
LASIZE: 35 mm
LAVOLA4C: 40.9 mL
LDCA: 3.14 cm2
LEFT ATRIUM END SYS DIAM: 35 mm
LV E/e' medial: 9.21
LV E/e'average: 9.21
LV PW d: 11.3 mm — AB (ref 0.6–1.1)
LV TDI E'LATERAL: 11.4
LV TDI E'MEDIAL: 10.1
LV e' LATERAL: 11.4 cm/s
LV sys vol index: 9 mL/m2
LVDIAVOL: 71 mL (ref 46–106)
LVDIAVOLIN: 32 mL/m2
LVOT SV: 79 mL
LVOT VTI: 25 cm
LVOTD: 20 mm
LVOTPV: 103 cm/s
LVSYSVOL: 19 mL (ref 14–42)
MV Dec: 164
MV Peak grad: 4 mmHg
MV pk E vel: 105 m/s
MVPKAVEL: 99 m/s
RV LATERAL S' VELOCITY: 12.9 cm/s
Simpson's disk: 73
Stroke v: 51 ml
TAPSE: 30.6 mm
TRMAXVEL: 124 cm/s

## 2016-09-01 ENCOUNTER — Encounter (HOSPITAL_COMMUNITY): Payer: 59

## 2016-09-01 MED FILL — ADDERALL XR 20 MG CAP SA: 20 | 90 days supply | Qty: 90 | Fill #0

## 2016-09-03 ENCOUNTER — Ambulatory Visit (HOSPITAL_COMMUNITY)
Admission: RE | Admit: 2016-09-03 | Discharge: 2016-09-03 | Disposition: A | Discharge status: Home / Self Care | Payer: 59 | Source: Ambulatory Visit | Attending: Cardiology | Admitting: Cardiology

## 2016-09-03 DIAGNOSIS — R072 Precordial pain: Secondary | ICD-10-CM | POA: Diagnosis not present

## 2016-09-03 DIAGNOSIS — R0602 Shortness of breath: Secondary | ICD-10-CM | POA: Insufficient documentation

## 2016-09-03 LAB — EXERCISE TOLERANCE TEST
CSEPED: 6 min
CSEPPHR: 155 {beats}/min
Estimated workload: 9.9 METS
Exercise duration (sec): 59 s
MPHR: 176 {beats}/min
Percent HR: 88 %
RPE: 14
Rest HR: 74 {beats}/min

## 2016-10-08 ENCOUNTER — Encounter: Payer: Self-pay | Admitting: Family Medicine

## 2016-10-08 ENCOUNTER — Ambulatory Visit (INDEPENDENT_AMBULATORY_CARE_PROVIDER_SITE_OTHER): Payer: 59 | Admitting: Family Medicine

## 2016-10-08 VITALS — BP 136/80 | HR 77 | Temp 98.4°F | Ht 67.75 in | Wt 221.6 lb

## 2016-10-08 DIAGNOSIS — M26609 Unspecified temporomandibular joint disorder, unspecified side: Secondary | ICD-10-CM

## 2016-10-08 DIAGNOSIS — R51 Headache: Secondary | ICD-10-CM | POA: Diagnosis not present

## 2016-10-08 DIAGNOSIS — R519 Headache, unspecified: Secondary | ICD-10-CM

## 2016-10-08 NOTE — Patient Instructions (Signed)
Let's have you try aleve twice a day  For next 5-7 days. Or if using ibuporofen use 3x a day. 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

## 2016-10-08 NOTE — Progress Notes (Signed)
Subjective:  Candice Parker is a 46 y.o. year old very pleasant female patient who presents for/with See problem oriented charting ROS- No chest pain or shortness of breath. No headache or blurry vision. Does have jaw pain. No facial weakness.    Past Medical History-  Patient Active Problem List   Diagnosis Date Noted  . Attention deficit disorder (ADD) in adult 06/13/2014    Priority: High  . Left knee pain 06/13/2014    Priority: Medium  . HTN (hypertension) 07/17/2010    Priority: Medium  . Left elbow pain 06/13/2014    Priority: Low  . Herpes simplex 06/13/2014    Priority: Low  . Chest pain, atypical 04/11/2011    Priority: Low  . Intermittent memory loss 02/18/2011    Priority: Low  . Obesity 12/10/2010    Priority: Low  . Asthma 01/29/2009    Priority: Low    Medications- reviewed and updated Current Outpatient Prescriptions  Medication Sig Dispense Refill  . acyclovir (ZOVIRAX) 400 MG tablet Take 1 tablet (400 mg total) by mouth 5 (five) times daily. For 5 days- take at first sign of cold sore 30 tablet 2  . Acyclovir-Hydrocortisone 5-1 % CREA Apply 5 times/day for 5 days 5 g 2  . albuterol (PROVENTIL HFA;VENTOLIN HFA) 108 (90 BASE) MCG/ACT inhaler Inhale 1-2 puffs into the lungs every 6 (six) hours as needed for wheezing or shortness of breath. 18 g 5  . amphetamine-dextroamphetamine (ADDERALL XR) 20 MG 24 hr capsule Take 1 capsule (20 mg total) by mouth every morning. 90 capsule 0  . ibuprofen (ADVIL,MOTRIN) 200 MG tablet Take 400 mg by mouth every 6 (six) hours as needed.    Marland Kitchen lisinopril-hydrochlorothiazide (ZESTORETIC) 20-12.5 MG tablet Take 1 tablet by mouth daily. 90 tablet 3  . Multiple Vitamin (MULTIVITAMIN) tablet Take 1 tablet by mouth daily. Women's One-A-Day     No current facility-administered medications for this visit.     Objective: BP 136/80 (BP Location: Left Arm, Patient Position: Sitting, Cuff Size: Large)   Pulse 77   Temp 98.4 F (36.9 C)  (Oral)   Ht 5' 7.75" (1.721 m)   Wt 221 lb 9.6 oz (100.5 kg)   SpO2 99%   BMI 33.94 kg/m  Gen: NAD, resting comfortably TM normal. 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 Every so often will get a shocking type pain along right side of her face. Triggered it when put in invasalign this morning, sometimes when brushing teeth or moving jaw a certain way. Feels like a lightning bolt/electricity. Lasts about a minute at the longest- usually seconds Warm washcloth doesn't help. At least once a day up to several times a day. Started a few months ago.   Has had some mild tingling in the face at times. Some mild dizziness.   Saw orthodontist and they did not think it was TMJD. 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 Should proceed with braces on top teeth as better alignment may help issues. A few episodes  each day lasting seconds to up to a minute. We discussed differential to include trigeminal neuralgia (thought less likely as only occurring with jaw movement) or MS (no history of other issues separated by time and space though). We discussed MR brain for potential trigeminal neuralgia but she would prefer to try conservative care with NSaids for 5-7 days which I think is very reasonable. She was given precautions for calling back and we can set up at a later date if needed.    We also reviewed prior cardiac workup with low risk stress test. She also has lost 10 lbs workign with a trainer since  that time and congratulated on effort!   The duration of face-to-face time during this visit was greater than 25 minutes. Greater than 50% of this time was spent in counseling, explanation of diagnosis, planning of further management, and/or coordination of care including discussion of potential causes, workup, treatment, comforting patient about concerns of more serious illness as cause (but also discussing the possibilities).    Return precautions advised.  Garret Reddish, MD

## 2016-11-08 ENCOUNTER — Other Ambulatory Visit: Payer: Self-pay | Admitting: Family Medicine

## 2016-11-12 MED FILL — LISINOPRIL-HCTZ 20-12.5 MG: 20-12.5 | 90 days supply | Qty: 90 | Fill #0

## 2016-11-12 NOTE — Telephone Encounter (Signed)
MEDICATION: lisinopril-hydrochlorothiazide (ZESTORETIC) 20-12.5 MG tablet  PHARMACY:   [facility], [place]. [phone] (Phone) [phone] (Fax)   IS THIS A 90 DAY SUPPLY : yes  IS PATIENT OUT OF MEDICATION: yes  IF NOT; HOW MUCH IS LEFT: n/a  LAST APPOINTMENT DATE: @8 /31/2018  NEXT APPOINTMENT DATE:@Visit  date not found  OTHER COMMENTS:    **Let patient know to contact pharmacy at the end of the day to make sure medication is ready. **  ** Please notify patient to allow 48-72 hours to process**  **Encourage patient to contact the pharmacy for refills or they can request refills through [facility]**

## 2016-12-24 ENCOUNTER — Other Ambulatory Visit: Payer: Self-pay | Admitting: Family Medicine

## 2016-12-29 NOTE — Telephone Encounter (Signed)
Yes thanks may fill 

## 2017-01-03 MED ORDER — AMPHETAMINE-DEXTROAMPHET ER 20 MG PO CP24: 20.0000 mg | ORAL_CAPSULE | ORAL | 0 refills | Status: DC

## 2017-01-04 ENCOUNTER — Telehealth: Payer: Self-pay

## 2017-01-04 NOTE — Telephone Encounter (Signed)
error 

## 2017-01-05 MED FILL — ADDERALL XR 20 MG CAP SA: 20 | 90 days supply | Qty: 90 | Fill #0

## 2017-03-09 MED FILL — LISINOPRIL-HCTZ 20-12.5 MG: 20-12.5 | 90 days supply | Qty: 90 | Fill #1

## 2017-03-15 ENCOUNTER — Encounter: Payer: Self-pay | Admitting: Family Medicine

## 2017-04-19 ENCOUNTER — Other Ambulatory Visit: Payer: Self-pay | Admitting: Family Medicine

## 2017-04-21 NOTE — Telephone Encounter (Signed)
isnt it time for her 6 month visit? I would call her and not just reject the refill if so

## 2017-04-25 ENCOUNTER — Other Ambulatory Visit: Payer: Self-pay | Admitting: Family Medicine

## 2017-04-25 NOTE — Telephone Encounter (Signed)
Called and spoke with patient. She is scheduled for 05/06/17. She stated she would wait and just get her prescription filled then. I did offer her a 1 month refill to get her to her appointment

## 2017-04-26 NOTE — Telephone Encounter (Signed)
Dr Yong Channel, I called patient and she states she has 3 days left. She states that she does not take it on the weekends. She was not sure if her insurance would cover a 30 day supply but she agrees to get medication refilled for 30 days and check cost when she picks it up so that she will have enough for next week. Please advise.

## 2017-04-26 NOTE — Telephone Encounter (Signed)
Did she want the 1 month fill?

## 2017-04-27 ENCOUNTER — Telehealth: Payer: Self-pay | Admitting: Family Medicine

## 2017-04-27 NOTE — Telephone Encounter (Signed)
Copied from [place] [id number]. Topic: Quick Communication - Rx Refill/Question >> Apr 27, 2017  8:58 AM Scherrie Gerlach wrote: Medication: amphetamine-dextroamphetamine (ADDERALL XR) 20 MG 24 hr capsule  Pt has made 3/29 with Dr Yong Channel, but states she has run out and thought she could get a 30 day to get her through  Sent to [place], [place] - [address]. [phone] (Phone) [phone] (Fax)

## 2017-04-28 MED ORDER — AMPHETAMINE-DEXTROAMPHET ER 20 MG PO CP24: 20.0000 mg | ORAL_CAPSULE | ORAL | 0 refills | Status: DC

## 2017-04-28 MED FILL — ADDERALL XR 20 MG CAP SA: 20 | 90 days supply | Qty: 90 | Fill #0

## 2017-04-28 NOTE — Addendum Note (Signed)
Addended by: Marin Olp on: 04/28/2017 08:21 AM   Modules accepted: Orders

## 2017-04-28 NOTE — Telephone Encounter (Signed)
Since we are scheduled for a visit- I went ahead and sent 90 days in since her plan prefers that

## 2017-04-28 NOTE — Telephone Encounter (Signed)
Prescription was sent to the pharmacy. 90 day supply per her insurance requirements

## 2017-05-06 ENCOUNTER — Ambulatory Visit (INDEPENDENT_AMBULATORY_CARE_PROVIDER_SITE_OTHER): Payer: No Typology Code available for payment source | Admitting: Family Medicine

## 2017-05-06 ENCOUNTER — Encounter: Payer: Self-pay | Admitting: Family Medicine

## 2017-05-06 VITALS — BP 124/68 | HR 76 | Temp 98.4°F | Ht 67.5 in | Wt 220.6 lb

## 2017-05-06 DIAGNOSIS — H538 Other visual disturbances: Secondary | ICD-10-CM

## 2017-05-06 DIAGNOSIS — I10 Essential (primary) hypertension: Secondary | ICD-10-CM | POA: Diagnosis not present

## 2017-05-06 DIAGNOSIS — R5383 Other fatigue: Secondary | ICD-10-CM | POA: Diagnosis not present

## 2017-05-06 DIAGNOSIS — R519 Headache, unspecified: Secondary | ICD-10-CM

## 2017-05-06 DIAGNOSIS — R51 Headache: Secondary | ICD-10-CM | POA: Diagnosis not present

## 2017-05-06 DIAGNOSIS — F988 Other specified behavioral and emotional disorders with onset usually occurring in childhood and adolescence: Secondary | ICD-10-CM

## 2017-05-06 DIAGNOSIS — Z1231 Encounter for screening mammogram for malignant neoplasm of breast: Secondary | ICD-10-CM | POA: Diagnosis not present

## 2017-05-06 DIAGNOSIS — Z1239 Encounter for other screening for malignant neoplasm of breast: Secondary | ICD-10-CM

## 2017-05-06 LAB — COMPREHENSIVE METABOLIC PANEL
ALBUMIN: 3.7 g/dL (ref 3.5–5.2)
ALK PHOS: 61 U/L (ref 39–117)
ALT: 13 U/L (ref 0–35)
AST: 19 U/L (ref 0–37)
BILIRUBIN TOTAL: 1 mg/dL (ref 0.2–1.2)
BUN: 14 mg/dL (ref 6–23)
CALCIUM: 9.3 mg/dL (ref 8.4–10.5)
CO2: 31 mEq/L (ref 19–32)
CREATININE: 0.9 mg/dL (ref 0.40–1.20)
Chloride: 101 mEq/L (ref 96–112)
GFR: 87.15 mL/min (ref >60.00)
Glucose, Bld: 103 mg/dL — ABNORMAL HIGH (ref 70–99)
Potassium: 3.9 mEq/L (ref 3.5–5.1)
Sodium: 138 mEq/L (ref 135–145)
TOTAL PROTEIN: 7.1 g/dL (ref 6.0–8.3)

## 2017-05-06 LAB — CBC
HCT: 39.3 % (ref 36.0–46.0)
Hemoglobin: 13.4 g/dL (ref 12.0–15.0)
MCHC: 34 g/dL (ref 30.0–36.0)
MCV: 88.9 fl (ref 78.0–100.0)
PLATELETS: 306 10*3/uL (ref 150.0–400.0)
RBC: 4.42 Mil/uL (ref 3.87–5.11)
RDW: 12.6 % (ref 11.5–15.5)
WBC: 6 10*3/uL (ref 4.0–10.5)

## 2017-05-06 LAB — TSH: TSH: 1.15 u[IU]/mL (ref 0.35–4.50)

## 2017-05-06 NOTE — Patient Instructions (Addendum)
Please stop by lab before you go  We will call you within a week or two about your referral to MRI. If you do not hear within 3 weeks, give [place] a call.  MRI can take some time to get set up- If the MRI takes more than a month to get set up- see me back in 2-3 weeks to check back in.   Can do one more refill of adderrall before next visit  Lets go ahead and schedule your physical for late July or later as well

## 2017-05-06 NOTE — Progress Notes (Signed)
Subjective:  Candice Parker is a 47 y.o. year old very pleasant female patient who presents for/with See problem oriented charting ROS- did have episode of blurry viison. Did have severe headache. Has anhedonia and depressed mood. No SI.    Past Medical History-  Patient Active Problem List   Diagnosis Date Noted  . Attention deficit disorder (ADD) in adult 06/13/2014    Priority: High  . Left knee pain 06/13/2014    Priority: Medium  . HTN (hypertension) 07/17/2010    Priority: Medium  . Left elbow pain 06/13/2014    Priority: Low  . Herpes simplex 06/13/2014    Priority: Low  . Chest pain, atypical 04/11/2011    Priority: Low  . Intermittent memory loss 02/18/2011    Priority: Low  . Obesity 12/10/2010    Priority: Low  . Asthma 01/29/2009    Priority: Low    Medications- reviewed and updated Current Outpatient Medications  Medication Sig Dispense Refill  . acyclovir (ZOVIRAX) 400 MG tablet Take 1 tablet (400 mg total) by mouth 5 (five) times daily. For 5 days- take at first sign of cold sore 30 tablet 2  . Acyclovir-Hydrocortisone 5-1 % CREA Apply 5 times/day for 5 days 5 g 2  . albuterol (PROVENTIL HFA;VENTOLIN HFA) 108 (90 BASE) MCG/ACT inhaler Inhale 1-2 puffs into the lungs every 6 (six) hours as needed for wheezing or shortness of breath. 18 g 5  . amphetamine-dextroamphetamine (ADDERALL XR) 20 MG 24 hr capsule Take 1 capsule (20 mg total) by mouth every morning. 90 capsule 0  . ibuprofen (ADVIL,MOTRIN) 200 MG tablet Take 400 mg by mouth every 6 (six) hours as needed.    Marland Kitchen lisinopril-hydrochlorothiazide (PRINZIDE,ZESTORETIC) 20-12.5 MG tablet TAKE 1 TABLET BY MOUTH DAILY. 90 tablet 3  . Multiple Vitamin (MULTIVITAMIN) tablet Take 1 tablet by mouth daily. Women's One-A-Day     Objective: BP 124/68 (BP Location: Left Arm, Patient Position: Sitting, Cuff Size: Large)   Pulse 76   Temp 98.4 F (36.9 C) (Oral)   Ht 5' 7.5" (1.715 m)   Wt 220 lb 9.6 oz (100.1 kg)    SpO2 98%   BMI 34.04 kg/m  Gen: NAD, resting comfortably CV: RRR no murmurs rubs or gallops Lungs: CTAB no crackles, wheeze, rhonchi Abdomen: soft/nontender/nondistended/normal bowel sounds.   Ext: no edema Skin: warm, dry Neuro: CN II-XII intact, sensation and reflexes normal throughout, 5/5 muscle strength in bilateral upper and lower extremities. Normal finger to nose. Normal rapid alternating movements. No pronator drift. Normal romberg. Normal gait.    Assessment/Plan:  Breast cancer screening - Plan: MM Digital Screening  Fatigue, unspecified type - Plan: CBC, Comprehensive metabolic panel, TSH New onset headache with likely aura preceding (blurry vision) - Plan: MR Brain W Wo Contrast S: She was at work and noted kaleidoscope type vision about 2-3 weeks ago. Then got intense pain near the right eye. Lasted several hours- went home. Kaleidoscope issues went away when headache started. Took ibuprofen. Very rarely gets headaches at baseline. No history of migraine or aura. With sleep and ibuprofen- resolved within a few hours.   No headache since then- has not felt like herself since that time though. Has felt weak, emotional, not feeling like doing anything since that time. Has had anhedonia and depressed mood. Still working out- had been working out for a year and feels liek shes back at square 1 with working out- cant get a good workout.   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   Right sided sore throat over last 2 days- some pain into ears as well. Wonders if she is getting sick Depression screen [facility] 2/9 05/06/2017  Decreased Interest 3  Down, Depressed, Hopeless 3  PHQ - 2 Score 6  Altered sleeping 3  Tired, decreased energy 3  Change in appetite 1  Feeling bad or failure about yourself  1  Trouble concentrating 1  Moving slowly or fidgety/restless 1  Suicidal thoughts 0  PHQ-9  Score 16  Difficult doing work/chores Somewhat difficult    A/P:  47 year old female with what sounds to be a migraine. This would be the first of her life though which is odd- she had a reassuring neurological exam and normal vision test today. She also back in 2015 had issues with paresthesias in feet as well as memory loss- had reassuring MRI then- had seen neurology. With current symptoms I doubt MS but in combo with prior symptoms plus stark change in mood since the headache- I think MRI brain is warranted to rule out stroke, mass- though I doubt both of these as well honestly. THis may simply be new onset migraine with depression- I asked her to follow up with me after MRI unless it is more thana month out at which point would want to see her in 2-3 weeks to check in on depression symptoms and would consider antidepressant- she wants to hold off on that today as well.   Of note- no cause of fatigue on her labs  She may have mild viral illness over last 2 days- will monitor these symptoms as well  Attention deficit disorder (ADD) in adult S: remains well controlled with adderall XR 20mg  A/P: wants to consider weaning off in long run- consider 10mg  at next visit. Can do 1 more refill by phone- id be willing to go down to 10mg  at that time if she requests- with other changes in her health as below- dont want to change this as may affect mood/energy levels  HTN (hypertension) S: controlled on Lisinopril-hctz 20-12.5mg  BP Readings from Last 3 Encounters:  05/06/17 124/68  10/08/16 136/80  08/24/16 132/80  A/P: at blood pressure goal of <140/90. Continue current meds   Future Appointments  Date Time Provider [place]  07/18/2017  8:15 AM Marin Olp, MD [facility]   Return in about 4 months (around 09/05/2017) for physical.  In addition to above instructions.   Return precautions advised.  Garret Reddish, MD

## 2017-05-07 NOTE — Assessment & Plan Note (Signed)
S: controlled on Lisinopril-hctz 20-12.5mg  BP Readings from Last 3 Encounters:  05/06/17 124/68  10/08/16 136/80  08/24/16 132/80  A/P: at blood pressure goal of <140/90. Continue current meds

## 2017-05-07 NOTE — Assessment & Plan Note (Signed)
S: remains well controlled with adderall XR 20mg  A/P: wants to consider weaning off in long run- consider 10mg  at next visit. Can do 1 more refill by phone- id be willing to go down to 10mg  at that time if she requests- with other changes in her health as below- dont want to change this as may affect mood/energy levels

## 2017-05-26 ENCOUNTER — Other Ambulatory Visit: Payer: No Typology Code available for payment source

## 2017-06-06 ENCOUNTER — Ambulatory Visit
Admission: RE | Admit: 2017-06-06 | Discharge: 2017-06-06 | Disposition: A | Discharge status: Home / Self Care | Payer: No Typology Code available for payment source | Source: Ambulatory Visit | Attending: Family Medicine | Admitting: Family Medicine

## 2017-06-06 DIAGNOSIS — R519 Headache, unspecified: Secondary | ICD-10-CM

## 2017-06-06 DIAGNOSIS — R51 Headache: Principal | ICD-10-CM

## 2017-06-06 DIAGNOSIS — H538 Other visual disturbances: Secondary | ICD-10-CM

## 2017-06-06 MED ORDER — GADOBENATE DIMEGLUMINE 529 MG/ML IV SOLN
20.0000 mL | Freq: Once | INTRAVENOUS | Status: AC | PRN
  Administered 2017-06-06: 20 mL via INTRAVENOUS

## 2017-06-06 NOTE — Progress Notes (Signed)
Great news!  MRI of the brain was normal  If you are still having these headaches-we can refer you to neurology if you would like or we can sit down to discuss options

## 2017-06-07 ENCOUNTER — Telehealth: Payer: Self-pay

## 2017-06-07 ENCOUNTER — Other Ambulatory Visit: Payer: Self-pay

## 2017-06-07 MED ORDER — LISINOPRIL-HYDROCHLOROTHIAZIDE 20-12.5 MG PO TABS: 1.0000 | ORAL_TABLET | Freq: Every day | ORAL | 3 refills | Status: DC

## 2017-06-07 MED FILL — LISINOPRIL-HCTZ 20-12.5 MG: 20-12.5 | 90 days supply | Qty: 90 | Fill #0

## 2017-06-07 NOTE — Telephone Encounter (Signed)
Called patient to see if she had viewed her mychart message and she did. Patient didn't have any follow up questions. Patient verbalized understanding.

## 2017-06-08 ENCOUNTER — Ambulatory Visit
Admission: RE | Admit: 2017-06-08 | Discharge: 2017-06-08 | Disposition: A | Discharge status: Home / Self Care | Payer: No Typology Code available for payment source | Source: Ambulatory Visit | Attending: Family Medicine | Admitting: Family Medicine

## 2017-06-08 DIAGNOSIS — Z1239 Encounter for other screening for malignant neoplasm of breast: Secondary | ICD-10-CM

## 2017-07-18 ENCOUNTER — Ambulatory Visit (INDEPENDENT_AMBULATORY_CARE_PROVIDER_SITE_OTHER): Payer: No Typology Code available for payment source | Admitting: Family Medicine

## 2017-07-18 ENCOUNTER — Encounter: Payer: Self-pay | Admitting: Family Medicine

## 2017-07-18 VITALS — BP 108/72 | HR 63 | Temp 98.4°F | Ht 67.5 in | Wt 215.8 lb

## 2017-07-18 DIAGNOSIS — Z79899 Other long term (current) drug therapy: Secondary | ICD-10-CM | POA: Diagnosis not present

## 2017-07-18 DIAGNOSIS — I10 Essential (primary) hypertension: Secondary | ICD-10-CM | POA: Diagnosis not present

## 2017-07-18 DIAGNOSIS — F988 Other specified behavioral and emotional disorders with onset usually occurring in childhood and adolescence: Secondary | ICD-10-CM | POA: Diagnosis not present

## 2017-07-18 DIAGNOSIS — F4541 Pain disorder exclusively related to psychological factors: Secondary | ICD-10-CM | POA: Diagnosis not present

## 2017-07-18 LAB — LIPID PANEL
CHOLESTEROL: 158 mg/dL (ref 0–200)
HDL: 47.3 mg/dL (ref >39.00)
LDL Cholesterol: 99 mg/dL (ref 0–99)
NonHDL: 111.13
TRIGLYCERIDES: 63 mg/dL (ref 0.0–149.0)
Total CHOL/HDL Ratio: 3
VLDL: 12.6 mg/dL (ref 0.0–40.0)

## 2017-07-18 LAB — BASIC METABOLIC PANEL
BUN: 13 mg/dL (ref 6–23)
CALCIUM: 9.3 mg/dL (ref 8.4–10.5)
CO2: 29 mEq/L (ref 19–32)
CREATININE: 0.95 mg/dL (ref 0.40–1.20)
Chloride: 105 mEq/L (ref 96–112)
GFR: 81.8 mL/min (ref >60.00)
Glucose, Bld: 103 mg/dL — ABNORMAL HIGH (ref 70–99)
Potassium: 3.9 mEq/L (ref 3.5–5.1)
Sodium: 140 mEq/L (ref 135–145)

## 2017-07-18 MED ORDER — AMPHETAMINE-DEXTROAMPHET ER 20 MG PO CP24: 20.0000 mg | ORAL_CAPSULE | ORAL | 0 refills | Status: DC

## 2017-07-18 NOTE — Assessment & Plan Note (Signed)
S:New onset headache-as of last visit in March for 2 to 3 weeks.  She was having blurry vision before the headache started.  We discussed this could very well be migraines.  We will get an MRI to rule out stroke or mass.  MS on the differential but doubt more consistent with migraines though interesting new onset-  She has been under extensive stress though.  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 A/P: PHQ 9 down to 0 today.  She has had no more headaches.  She has improved some organizational things at work and overall stress levels have improved

## 2017-07-18 NOTE — Patient Instructions (Addendum)
Please stop by lab before you go   schedule physical in 3-6 months  Refills for 90 days on Adderall given  I am thrilled your headaches have resolved.  Great job on weight loss and regular exercise!

## 2017-07-18 NOTE — Assessment & Plan Note (Signed)
S:ADD-remains well controlled on Adderall 20 mg extended release.  She wants to wean off this if possible-last visit we discussed possibly doing 10 mg at this visit.  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 Cheryln Manly around 2010 - Urine drug screen- today

## 2017-07-18 NOTE — Progress Notes (Signed)
Subjective:  Candice Parker is a 47 y.o. year old very pleasant female patient who presents for/with See problem oriented charting ROS- No chest pain or shortness of breath. No headache or blurry vision.    Past Medical History-  Patient Active Problem List   Diagnosis Date Noted  . Attention deficit disorder (ADD) in adult 06/13/2014    Priority: High  . Left knee pain 06/13/2014    Priority: Medium  . HTN (hypertension) 07/17/2010    Priority: Medium  . Stress headache 07/18/2017    Priority: Low  . Left elbow pain 06/13/2014    Priority: Low  . Herpes simplex 06/13/2014    Priority: Low  . Chest pain, atypical 04/11/2011    Priority: Low  . Intermittent memory loss 02/18/2011    Priority: Low  . Obesity 12/10/2010    Priority: Low  . Asthma 01/29/2009    Priority: Low    Medications- reviewed and updated Current Outpatient Medications  Medication Sig Dispense Refill  . Acyclovir-Hydrocortisone 5-1 % CREA Apply 5 times/day for 5 days 5 g 2  . albuterol (PROVENTIL HFA;VENTOLIN HFA) 108 (90 BASE) MCG/ACT inhaler Inhale 1-2 puffs into the lungs every 6 (six) hours as needed for wheezing or shortness of breath. 18 g 5  . amphetamine-dextroamphetamine (ADDERALL XR) 20 MG 24 hr capsule Take 1 capsule (20 mg total) by mouth every morning. 90 capsule 0  . ibuprofen (ADVIL,MOTRIN) 200 MG tablet Take 400 mg by mouth every 6 (six) hours as needed.    Marland Kitchen lisinopril-hydrochlorothiazide (PRINZIDE,ZESTORETIC) 20-12.5 MG tablet Take 1 tablet by mouth daily. 90 tablet 3  . Multiple Vitamin (MULTIVITAMIN) tablet Take 1 tablet by mouth daily. Women's One-A-Day    . acyclovir (ZOVIRAX) 400 MG tablet Take 1 tablet (400 mg total) by mouth 5 (five) times daily. For 5 days- take at first sign of cold sore (Patient not taking: Reported on 07/18/2017) 30 tablet 2   No current facility-administered medications for this visit.     Objective: BP 108/72 (BP Location: Left Arm, Patient Position:  Sitting, Cuff Size: Large)   Pulse 63   Temp 98.4 F (36.9 C) (Oral)   Ht 5' 7.5" (1.715 m)   Wt 215 lb 12.8 oz (97.9 kg)   SpO2 97%   BMI 33.30 kg/m  Gen: NAD, resting comfortably CV: RRR no murmurs rubs or gallops Lungs: CTAB no crackles, wheeze, rhonchi Abdomen: soft/nontender/nondistended/normal bowel sounds.  Ext: no edema Skin: warm, dry  Assessment/Plan:  Stress headache S:New onset headache-as of last visit in March for 2 to 3 weeks.  She was having blurry vision before the headache started.  We discussed this could very well be migraines.  We will get an MRI to rule out stroke or mass.  MS on the differential but doubt more consistent with migraines though interesting new onset-  She has been under extensive stress though.  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 A/P: PHQ 9 down to 0 today.  She has had no more headaches.  She has improved some organizational things at work and overall stress levels have improved   HTN (hypertension) S: controlled on lisinopril-hydrochlorothiazide 20-12.5 mg.  She has lost another 5 pounds-4 days workout with Tae, Friday or Saturday walks outside for 30  minutes or cuts grass. BP Readings from Last 3 Encounters:  07/18/17 108/72  05/06/17 124/68  10/08/16 136/80  A/P: We discussed blood pressure goal of <140/90. Continue current meds: She asks about reducing her medicine.  We discussed possibly cutting her pill in half if at next visit blood pressure less than 115/75.  Congratulated her on her excellent job with weight loss.    Attention deficit disorder (ADD) in adult S:ADD-remains well controlled on Adderall 20 mg extended release.  She wants to wean off this if possible-last visit we discussed possibly doing 10 mg at this visit.  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 Cheryln Manly around 2010 - Urine drug screen- today  schedule physical in 3-6 months  Lab/Order associations: Attention deficit disorder (ADD) in adult - Plan: Pain Mgmt, Profile 8 w/Conf, U  Essential hypertension - Plan: Basic metabolic panel, Lipid panel  Stress headache  High risk medication use - Plan: Pain Mgmt, Profile 8 w/Conf, U  Meds ordered this encounter  Medications  . amphetamine-dextroamphetamine (ADDERALL XR) 20 MG 24 hr capsule    Sig: Take 1 capsule (20 mg total) by mouth every morning.    Dispense:  90 capsule    Refill:  0    Return precautions advised.  Garret Reddish, MD

## 2017-07-18 NOTE — Assessment & Plan Note (Signed)
S: controlled on lisinopril-hydrochlorothiazide 20-12.5 mg.  She has lost another 5 pounds-4 days workout with Tae, Friday or Saturday walks outside for 30 minutes or cuts grass. BP Readings from Last 3 Encounters:  07/18/17 108/72  05/06/17 124/68  10/08/16 136/80  A/P: We discussed blood pressure goal of <140/90. Continue current meds: She asks about reducing her medicine.  We discussed possibly cutting her pill in half if at next visit blood pressure less than 115/75.  Congratulated her on her excellent job with weight loss.

## 2017-07-18 NOTE — Progress Notes (Signed)
Please let us know by responding to this when you get this mychart message and let us know if you understand the results/directions  Your cholesterol still looks great- up some from last check but suspect will improve with your continued lifestyle changes Your BMET was normal (kidney, electrolytes, blood sugar) other than blood sugar slightly high. At risk for diabetes with blood sugar 103 (at risk from 100-125). Continue your efforts with healthy eating, regular exercise, weight loss and I suspect this will improve by next year.

## 2017-07-21 ENCOUNTER — Encounter: Payer: Self-pay | Admitting: Family Medicine

## 2017-07-21 LAB — PAIN MGMT, PROFILE 8 W/CONF, U
6 Acetylmorphine: NEGATIVE ng/mL (ref <10)
ALCOHOL METABOLITES: NEGATIVE ng/mL (ref <500)
Amphetamine: 6478 ng/mL — ABNORMAL HIGH (ref <250)
Amphetamines: POSITIVE ng/mL — AB (ref <500)
Benzodiazepines: NEGATIVE ng/mL (ref <100)
Buprenorphine, Urine: NEGATIVE ng/mL (ref <5)
COCAINE METABOLITE: NEGATIVE ng/mL (ref <150)
Creatinine: 284.8 mg/dL
MDMA: NEGATIVE ng/mL (ref <500)
METHAMPHETAMINE: NEGATIVE ng/mL (ref <250)
Marijuana Metabolite: NEGATIVE ng/mL (ref <20)
OPIATES: NEGATIVE ng/mL (ref <100)
OXIDANT: NEGATIVE ug/mL (ref <200)
OXYCODONE: NEGATIVE ng/mL (ref <100)
pH: 6.15 (ref 4.5–9.0)

## 2017-08-05 MED FILL — ADDERALL XR 20 MG CAP SA: 20 | 90 days supply | Qty: 90 | Fill #0

## 2017-09-07 ENCOUNTER — Encounter: Payer: Self-pay | Admitting: Nurse Practitioner

## 2017-09-07 ENCOUNTER — Ambulatory Visit (INDEPENDENT_AMBULATORY_CARE_PROVIDER_SITE_OTHER): Payer: Self-pay | Admitting: Nurse Practitioner

## 2017-09-07 VITALS — BP 122/80 | HR 73 | Temp 98.7°F | Wt 214.8 lb

## 2017-09-07 DIAGNOSIS — R319 Hematuria, unspecified: Secondary | ICD-10-CM

## 2017-09-07 DIAGNOSIS — N898 Other specified noninflammatory disorders of vagina: Secondary | ICD-10-CM

## 2017-09-07 DIAGNOSIS — M545 Low back pain, unspecified: Secondary | ICD-10-CM

## 2017-09-07 LAB — POC URINALSYSI DIPSTICK (AUTOMATED)
Bilirubin, UA: NEGATIVE
Glucose, UA: NEGATIVE
KETONES UA: NEGATIVE
LEUKOCYTES UA: NEGATIVE
Nitrite, UA: NEGATIVE
PROTEIN UA: NEGATIVE
Spec Grav, UA: 1.02 (ref 1.010–1.025)
UROBILINOGEN UA: 0.2 U/dL
pH, UA: 5 (ref 5.0–8.0)

## 2017-09-07 NOTE — Patient Instructions (Addendum)
Back Pain, Adult Ibuprofen 800mg  every 8 hours for 3 days. Increase fluids. Apply ice to the low back  Approximately 3-4 times/day for the next 24 hours, then switch to heat for 2 days.  Toilet every 2 hours. Rest from performing strenuous exercises for the next 5-7 days.  Perform the back exercises provided. Return in 2 days for urine re-check and re-evaluation of symptoms to determine if treatment is needed at that time.   Many adults have back pain from time to time. Common causes of back pain include:  A strained muscle or ligament.  Wear and tear (degeneration) of the spinal disks.  Arthritis.  A hit to the back.  Back pain can be short-lived (acute) or last a long time (chronic). A physical exam, lab tests, and imaging studies may be done to find the cause of your pain. Follow these instructions at home: Managing pain and stiffness  Take over-the-counter and prescription medicines only as told by your health care provider.  If directed, apply heat to the affected area as often as told by your health care provider. Use the heat source that your health care provider recommends, such as a moist heat pack or a heating pad. ? Place a towel between your skin and the heat source. ? Leave the heat on for 20-30 minutes. ? Remove the heat if your skin turns bright red. This is especially important if you are unable to feel pain, heat, or cold. You have a greater risk of getting burned.  If directed, apply ice to the injured area: ? Put ice in a plastic bag. ? Place a towel between your skin and the bag. ? Leave the ice on for 20 minutes, 2-3 times a day for the first 2-3 days. Activity  Do not stay in bed. Resting more than 1-2 days can delay your recovery.  Take short walks on even surfaces as soon as you are able. Try to increase the length of time you walk each day.  Do not sit, drive, or stand in one place for more than 30 minutes at a time. Sitting or standing for long  periods of time can put stress on your back.  Use proper lifting techniques. When you bend and lift, use positions that put less stress on your back: ? [place]la your knees. ? Keep the load close to your body. ? Avoid twisting.  Exercise regularly as told by your health care provider. Exercising will help your back heal faster. This also helps prevent back injuries by keeping muscles strong and flexible.  Your health care provider may recommend that you see a physical therapist. This person can help you come up with a safe exercise program. Do any exercises as told by your physical therapist. Lifestyle  Maintain a healthy weight. Extra weight puts stress on your back and makes it difficult to have good posture.  Avoid activities or situations that make you feel anxious or stressed. Learn ways to manage anxiety and stress. One way to manage stress is through exercise. Stress and anxiety increase muscle tension and can make back pain worse. General instructions  Sleep on a firm mattress in a comfortable position. Try lying on your side with your knees slightly bent. If you lie on your back, put a pillow under your knees.  Follow your treatment plan as told by your health care provider. This may include: ? Cognitive or behavioral therapy. ? Acupuncture or massage therapy. ? Meditation or yoga. Contact a health care provider  if:  You have pain that is not relieved with rest or medicine.  You have increasing pain going down into your legs or buttocks.  Your pain does not improve in 2 weeks.  You have pain at night.  You lose weight.  You have a fever or chills. Get help right away if:  You develop new bowel or bladder control problems.  You have unusual weakness or numbness in your arms or legs.  You develop nausea or vomiting.  You develop abdominal pain.  You feel faint. Summary  Many adults have back pain from time to time. A physical exam, lab tests, and imaging studies may  be done to find the cause of your pain.  Use proper lifting techniques. When you bend and lift, use positions that put less stress on your back.  Take over-the-counter and prescription medicines and apply heat or ice as directed by your health care provider. This information is not intended to replace advice given to you by your health care provider. Make sure you discuss any questions you have with your health care provider. Document Released: 01/25/2005 Document Revised: 03/01/2016 Document Reviewed: 03/01/2016 Elsevier Interactive Patient Education  2018 [employer].  Hematuria, Adult Hematuria is blood in your urine. It can be caused by a bladder infection, kidney infection, prostate infection, kidney stone, or cancer of your urinary tract. Infections can usually be treated with medicine, and a kidney stone usually will pass through your urine. If neither of these is the cause of your hematuria, further workup to find out the reason may be needed. It is very important that you tell your health care provider about any blood you see in your urine, even if the blood stops without treatment or happens without causing pain. Blood in your urine that happens and then stops and then happens again can be a symptom of a very serious condition. Also, pain is not a symptom in the initial stages of many urinary cancers. Follow these instructions at home:  Drink lots of fluid, 3-4 quarts a day. If you have been diagnosed with an infection, cranberry juice is especially recommended, in addition to large amounts of water.  Avoid caffeine, tea, and carbonated beverages because they tend to irritate the bladder.  Avoid alcohol because it may irritate the prostate.  Take all medicines as directed by your health care provider.  If you were prescribed an antibiotic medicine, finish it all even if you start to feel better.  If you have been diagnosed with a kidney stone, follow your health care provider's  instructions regarding straining your urine to catch the stone.  Empty your bladder often. Avoid holding urine for long periods of time.  After a bowel movement, women should cleanse front to back. Use each tissue only once.  Empty your bladder before and after sexual intercourse if you are a female. Contact a health care provider if:  You develop back pain.  You have a fever.  You have a feeling of sickness in your stomach (nausea) or vomiting.  Your symptoms are not better in 3 days. Return sooner if you are getting worse. Get help right away if:  You develop severe vomiting and are unable to keep the medicine down.  You develop severe back or abdominal pain despite taking your medicines.  You begin passing a large amount of blood or clots in your urine.  You feel extremely weak or faint, or you pass out. This information is not intended to replace advice  given to you by your health care provider. Make sure you discuss any questions you have with your health care provider. Document Released: 01/25/2005 Document Revised: 07/03/2015 Document Reviewed: 09/25/2012 Elsevier Interactive Patient Education  2017 [place].  Back Exercises The following exercises strengthen the muscles that help to support the back. They also help to keep the lower back flexible. Doing these exercises can help to prevent back pain or lessen existing pain. If you have back pain or discomfort, try doing these exercises 2-3 times each day or as told by your health care provider. When the pain goes away, do them once each day, but increase the number of times that you repeat the steps for each exercise (do more repetitions). If you do not have back pain or discomfort, do these exercises once each day or as told by your health care provider. Exercises Single Knee to Chest  Repeat these steps 3-5 times for each leg: 1. Lie on your back on a firm bed or the floor with your legs extended. 2. Bring one knee  to your chest. Your other leg should stay extended and in contact with the floor. 3. Hold your knee in place by grabbing your knee or thigh. 4. Pull on your knee until you feel a gentle stretch in your lower back. 5. Hold the stretch for 10-30 seconds. 6. Slowly release and straighten your leg.  Pelvic Tilt  Repeat these steps 5-10 times: 1. Lie on your back on a firm bed or the floor with your legs extended. 2. Bend your knees so they are pointing toward the ceiling and your feet are flat on the floor. 3. Tighten your lower abdominal muscles to press your lower back against the floor. This motion will tilt your pelvis so your tailbone points up toward the ceiling instead of pointing to your feet or the floor. 4. With gentle tension and even breathing, hold this position for 5-10 seconds.  Cat-Cow  Repeat these steps until your lower back becomes more flexible: 1. Get into a hands-and-knees position on a firm surface. Keep your hands under your shoulders, and keep your knees under your hips. You may place padding under your knees for comfort. 2. Let your head hang down, and point your tailbone toward the floor so your lower back becomes rounded like the back of a cat. 3. Hold this position for 5 seconds. 4. Slowly lift your head and point your tailbone up toward the ceiling so your back forms a sagging arch like the back of a cow. 5. Hold this position for 5 seconds.  Press-Ups  Repeat these steps 5-10 times: 1. Lie on your abdomen (face-down) on the floor. 2. Place your palms near your head, about shoulder-width apart. 3. While you keep your back as relaxed as possible and keep your hips on the floor, slowly straighten your arms to raise the top half of your body and lift your shoulders. Do not use your back muscles to raise your upper torso. You may adjust the placement of your hands to make yourself more comfortable. 4. Hold this position for 5 seconds while you keep your back  relaxed. 5. Slowly return to lying flat on the floor.  Bridges  Repeat these steps 10 times: 1. Lie on your back on a firm surface. 2. Bend your knees so they are pointing toward the ceiling and your feet are flat on the floor. 3. Tighten your buttocks muscles and lift your buttocks off of the floor until  your waist is at almost the same height as your knees. You should feel the muscles working in your buttocks and the back of your thighs. If you do not feel these muscles, slide your feet 1-2 inches farther away from your buttocks. 4. Hold this position for 3-5 seconds. 5. Slowly lower your hips to the starting position, and allow your buttocks muscles to relax completely.  If this exercise is too easy, try doing it with your arms crossed over your chest. Abdominal Crunches  Repeat these steps 5-10 times: 1. Lie on your back on a firm bed or the floor with your legs extended. 2. Bend your knees so they are pointing toward the ceiling and your feet are flat on the floor. 3. Cross your arms over your chest. 4. Tip your chin slightly toward your chest without bending your neck. 5. Tighten your abdominal muscles and slowly raise your trunk (torso) high enough to lift your shoulder blades a tiny bit off of the floor. Avoid raising your torso higher than that, because it can put too much stress on your low back and it does not help to strengthen your abdominal muscles. 6. Slowly return to your starting position.  Back Lifts Repeat these steps 5-10 times: 1. Lie on your abdomen (face-down) with your arms at your sides, and rest your forehead on the floor. 2. Tighten the muscles in your legs and your buttocks. 3. Slowly lift your chest off of the floor while you keep your hips pressed to the floor. Keep the back of your head in line with the curve in your back. Your eyes should be looking at the floor. 4. Hold this position for 3-5 seconds. 5. Slowly return to your starting position.  Contact  a health care provider if:  Your back pain or discomfort gets much worse when you do an exercise.  Your back pain or discomfort does not lessen within 2 hours after you exercise. If you have any of these problems, stop doing these exercises right away. Do not do them again unless your health care provider says that you can. Get help right away if:  You develop sudden, severe back pain. If this happens, stop doing the exercises right away. Do not do them again unless your health care provider says that you can. This information is not intended to replace advice given to you by your health care provider. Make sure you discuss any questions you have with your health care provider. Document Released: 03/04/2004 Document Revised: 06/04/2015 Document Reviewed: 03/21/2014 Elsevier Interactive Patient Education  2017 [employer]  Back Pain, Adult Many adults have back pain from time to time. Common causes of back pain include:  A strained muscle or ligament.  Wear and tear (degeneration) of the spinal disks.  Arthritis.  A hit to the back.  Back pain can be short-lived (acute) or last a long time (chronic). A physical exam, lab tests, and imaging studies may be done to find the cause of your pain. Follow these instructions at home: Managing pain and stiffness  Take over-the-counter and prescription medicines only as told by your health care provider.  If directed, apply heat to the affected area as often as told by your health care provider. Use the heat source that your health care provider recommends, such as a moist heat pack or a heating pad. ? Place a towel between your skin and the heat source. ? Leave the heat on for 20-30 minutes. ? Remove the heat if  your skin turns bright red. This is especially important if you are unable to feel pain, heat, or cold. You have a greater risk of getting burned.  If directed, apply ice to the injured area: ? Put ice in a plastic bag. ? Place a  towel between your skin and the bag. ? Leave the ice on for 20 minutes, 2-3 times a day for the first 2-3 days. Activity  Do not stay in bed. Resting more than 1-2 days can delay your recovery.  Take short walks on even surfaces as soon as you are able. Try to increase the length of time you walk each day.  Do not sit, drive, or stand in one place for more than 30 minutes at a time. Sitting or standing for long periods of time can put stress on your back.  Use proper lifting techniques. When you bend and lift, use positions that put less stress on your back: ? Baker your knees. ? Keep the load close to your body. ? Avoid twisting.  Exercise regularly as told by your health care provider. Exercising will help your back heal faster. This also helps prevent back injuries by keeping muscles strong and flexible.  Your health care provider may recommend that you see a physical therapist. This person can help you come up with a safe exercise program. Do any exercises as told by your physical therapist. Lifestyle  Maintain a healthy weight. Extra weight puts stress on your back and makes it difficult to have good posture.  Avoid activities or situations that make you feel anxious or stressed. Learn ways to manage anxiety and stress. One way to manage stress is through exercise. Stress and anxiety increase muscle tension and can make back pain worse. General instructions  Sleep on a firm mattress in a comfortable position. Try lying on your side with your knees slightly bent. If you lie on your back, put a pillow under your knees.  Follow your treatment plan as told by your health care provider. This may include: ? Cognitive or behavioral therapy. ? Acupuncture or massage therapy. ? Meditation or yoga. Contact a health care provider if:  You have pain that is not relieved with rest or medicine.  You have increasing pain going down into your legs or buttocks.  Your pain does not improve in  2 weeks.  You have pain at night.  You lose weight.  You have a fever or chills. Get help right away if:  You develop new bowel or bladder control problems.  You have unusual weakness or numbness in your arms or legs.  You develop nausea or vomiting.  You develop abdominal pain.  You feel faint. Summary  Many adults have back pain from time to time. A physical exam, lab tests, and imaging studies may be done to find the cause of your pain.  Use proper lifting techniques. When you bend and lift, use positions that put less stress on your back.  Take over-the-counter and prescription medicines and apply heat or ice as directed by your health care provider. This information is not intended to replace advice given to you by your health care provider. Make sure you discuss any questions you have with your health care provider. Document Released: 01/25/2005 Document Revised: 03/01/2016 Document Reviewed: 03/01/2016 Elsevier Interactive Patient Education  2018 [place].  Back Exercises The following exercises strengthen the muscles that help to support the back. They also help to keep the lower back flexible. Doing these exercises  can help to prevent back pain or lessen existing pain. If you have back pain or discomfort, try doing these exercises 2-3 times each day or as told by your health care provider. When the pain goes away, do them once each day, but increase the number of times that you repeat the steps for each exercise (do more repetitions). If you do not have back pain or discomfort, do these exercises once each day or as told by your health care provider. Exercises Single Knee to Chest  Repeat these steps 3-5 times for each leg: 7. Lie on your back on a firm bed or the floor with your legs extended. 8. Bring one knee to your chest. Your other leg should stay extended and in contact with the floor. 9. Hold your knee in place by grabbing your knee or thigh. 10. Pull on  your knee until you feel a gentle stretch in your lower back. 11. Hold the stretch for 10-30 seconds. 12. Slowly release and straighten your leg.  Pelvic Tilt  Repeat these steps 5-10 times: 5. Lie on your back on a firm bed or the floor with your legs extended. 6. Bend your knees so they are pointing toward the ceiling and your feet are flat on the floor. 7. Tighten your lower abdominal muscles to press your lower back against the floor. This motion will tilt your pelvis so your tailbone points up toward the ceiling instead of pointing to your feet or the floor. 8. With gentle tension and even breathing, hold this position for 5-10 seconds.  Cat-Cow  Repeat these steps until your lower back becomes more flexible: 6. Get into a hands-and-knees position on a firm surface. Keep your hands under your shoulders, and keep your knees under your hips. You may place padding under your knees for comfort. 7. Let your head hang down, and point your tailbone toward the floor so your lower back becomes rounded like the back of a cat. 8. Hold this position for 5 seconds. 9. Slowly lift your head and point your tailbone up toward the ceiling so your back forms a sagging arch like the back of a cow. 10. Hold this position for 5 seconds.  Press-Ups  Repeat these steps 5-10 times: 6. Lie on your abdomen (face-down) on the floor. 7. Place your palms near your head, about shoulder-width apart. 8. While you keep your back as relaxed as possible and keep your hips on the floor, slowly straighten your arms to raise the top half of your body and lift your shoulders. Do not use your back muscles to raise your upper torso. You may adjust the placement of your hands to make yourself more comfortable. 9. Hold this position for 5 seconds while you keep your back relaxed. 10. Slowly return to lying flat on the floor.  Bridges  Repeat these steps 10 times: 6. Lie on your back on a firm surface. 7. Bend your knees  so they are pointing toward the ceiling and your feet are flat on the floor. 8. Tighten your buttocks muscles and lift your buttocks off of the floor until your waist is at almost the same height as your knees. You should feel the muscles working in your buttocks and the back of your thighs. If you do not feel these muscles, slide your feet 1-2 inches farther away from your buttocks. 9. Hold this position for 3-5 seconds. 10. Slowly lower your hips to the starting position, and allow your buttocks muscles to  relax completely.  If this exercise is too easy, try doing it with your arms crossed over your chest. Abdominal Crunches  Repeat these steps 5-10 times: 7. Lie on your back on a firm bed or the floor with your legs extended. 8. Bend your knees so they are pointing toward the ceiling and your feet are flat on the floor. 9. Cross your arms over your chest. 10. Tip your chin slightly toward your chest without bending your neck. 70. Tighten your abdominal muscles and slowly raise your trunk (torso) high enough to lift your shoulder blades a tiny bit off of the floor. Avoid raising your torso higher than that, because it can put too much stress on your low back and it does not help to strengthen your abdominal muscles. 12. Slowly return to your starting position.  Back Lifts Repeat these steps 5-10 times: 6. Lie on your abdomen (face-down) with your arms at your sides, and rest your forehead on the floor. 7. Tighten the muscles in your legs and your buttocks. 8. Slowly lift your chest off of the floor while you keep your hips pressed to the floor. Keep the back of your head in line with the curve in your back. Your eyes should be looking at the floor. 9. Hold this position for 3-5 seconds. 10. Slowly return to your starting position.  Contact a health care provider if:  Your back pain or discomfort gets much worse when you do an exercise.  Your back pain or discomfort does not lessen  within 2 hours after you exercise. If you have any of these problems, stop doing these exercises right away. Do not do them again unless your health care provider says that you can. Get help right away if:  You develop sudden, severe back pain. If this happens, stop doing the exercises right away. Do not do them again unless your health care provider says that you can. This information is not intended to replace advice given to you by your health care provider. Make sure you discuss any questions you have with your health care provider. Document Released: 03/04/2004 Document Revised: 06/04/2015 Document Reviewed: 03/21/2014 Elsevier Interactive Patient Education  2017 [employer]  Hematuria, Adult Hematuria is blood in your urine. It can be caused by a bladder infection, kidney infection, prostate infection, kidney stone, or cancer of your urinary tract. Infections can usually be treated with medicine, and a kidney stone usually will pass through your urine. If neither of these is the cause of your hematuria, further workup to find out the reason may be needed. It is very important that you tell your health care provider about any blood you see in your urine, even if the blood stops without treatment or happens without causing pain. Blood in your urine that happens and then stops and then happens again can be a symptom of a very serious condition. Also, pain is not a symptom in the initial stages of many urinary cancers. Follow these instructions at home:  Drink lots of fluid, 3-4 quarts a day. If you have been diagnosed with an infection, cranberry juice is especially recommended, in addition to large amounts of water.  Avoid caffeine, tea, and carbonated beverages because they tend to irritate the bladder.  Avoid alcohol because it may irritate the prostate.  Take all medicines as directed by your health care provider.  If you were prescribed an antibiotic medicine, finish it all even if  you start to feel better.  If you  have been diagnosed with a kidney stone, follow your health care provider's instructions regarding straining your urine to catch the stone.  Empty your bladder often. Avoid holding urine for long periods of time.  After a bowel movement, women should cleanse front to back. Use each tissue only once.  Empty your bladder before and after sexual intercourse if you are a female. Contact a health care provider if:  You develop back pain.  You have a fever.  You have a feeling of sickness in your stomach (nausea) or vomiting.  Your symptoms are not better in 3 days. Return sooner if you are getting worse. Get help right away if:  You develop severe vomiting and are unable to keep the medicine down.  You develop severe back or abdominal pain despite taking your medicines.  You begin passing a large amount of blood or clots in your urine.  You feel extremely weak or faint, or you pass out. This information is not intended to replace advice given to you by your health care provider. Make sure you discuss any questions you have with your health care provider. Document Released: 01/25/2005 Document Revised: 07/03/2015 Document Reviewed: 09/25/2012 Elsevier Interactive Patient Education  2017 [employer].

## 2017-09-07 NOTE — Progress Notes (Addendum)
Subjective:    Patient ID: Candice Parker, female    DOB: 06-02-1970, 47 y.o.   MRN: [id number]  The patient is a 47 year old female who presents with a complaint of vaginal discharge and low back pain.  Symptoms started 1 day ago.  The patient does admit to nausea, sweats, and lightheadedness.  The patient denies any urinary symptoms such as frequency, urgency, hesitancy, dysuria, or hematuria.  The patient does admit to not drinking enough fluids during the day, and not urinating while at work.  Patient states she did have sex about 2 weeks ago, which was unprotected.  Patient did not seem to think that her sexual partner had an STD.  She states that she did contact him to find out if this was the case.  She states the partner denied it at that time.  The patient states the discharge is white, with no odor.  Patient also states the amount is scant.  Patient further endorses some palpitations, and that the back pain that she is having has been present for some time.  Patient states she does work out about 3-4 times a week.  Patient symptoms have been present while working out.   Back Pain  This is a new problem. The current episode started 1 to 4 weeks ago. The problem occurs intermittently. The problem is unchanged. The pain is present in the lumbar spine. The quality of the pain is described as aching. The pain does not radiate. The pain is at a severity of 5/10. The pain is the same all the time. The symptoms are aggravated by bending and twisting. Associated symptoms include a fever. Pertinent negatives include no abdominal pain, chest pain, dysuria, pelvic pain or weakness. Risk factors include obesity. She has tried nothing for the symptoms.  Vaginal Discharge  The patient's primary symptoms include vaginal discharge. The patient's pertinent negatives include no genital itching, genital odor, genital rash, pelvic pain or vaginal bleeding. This is a new problem. The current episode started yesterday.  The problem occurs intermittently. The problem has been unchanged. The patient is experiencing no pain. She is not pregnant. Associated symptoms include back pain, a fever and nausea. Pertinent negatives include no abdominal pain, dysuria, flank pain, frequency or urgency. The vaginal discharge was white. There has been no bleeding. Nothing aggravates the symptoms. She has tried nothing for the symptoms. She is sexually active. It is unknown whether or not her partner has an STD. She uses nothing for contraception. She is postmenopausal. Her past medical history is significant for herpes simplex. There is no history of an STD.     Review of Systems  Constitutional: Positive for fever.  HENT: Negative.   Eyes: Negative.   Respiratory: Negative.   Cardiovascular: Positive for palpitations. Negative for chest pain and leg swelling.  Gastrointestinal: Positive for nausea. Negative for abdominal pain.  Genitourinary: Positive for vaginal discharge. Negative for difficulty urinating, dyspareunia, dysuria, flank pain, frequency, genital sores, pelvic pain, urgency and vaginal pain.  Musculoskeletal: Positive for back pain.  Skin: Negative.   Neurological: Positive for light-headedness. Negative for tremors, syncope, speech difficulty and weakness.  Psychiatric/Behavioral:       History of anxiety       Objective:   Physical Exam  Constitutional: She is oriented to person, place, and time. She appears well-developed and well-nourished. No distress.  HENT:  Head: Normocephalic and atraumatic.  Right Ear: External ear normal.  Left Ear: External ear normal.  Eyes: Pupils are  equal, round, and reactive to light. EOM are normal.  Neck: Normal range of motion. Neck supple. No tracheal deviation present. No thyromegaly present.  Cardiovascular: Normal rate, regular rhythm and normal heart sounds.  Pulmonary/Chest: Effort normal and breath sounds normal.  Abdominal: Soft. Bowel sounds are normal. She  exhibits no distension. There is no tenderness.  Genitourinary: Vaginal discharge (scant amount of white discharge) found.  Musculoskeletal: She exhibits no edema.  Point tenderness to L-5.  FROM. No paraspinal tenderness, pain does not radiate  Neurological: She is alert and oriented to person, place, and time.  Skin: Skin is warm and dry. Capillary refill takes 2 to 3 seconds.  Psychiatric: She has a normal mood and affect.   Urinalysis Glucose: negative Bilirubin- negative Ketones- negative SG: 1.020 Blood: trace PH:5.0 Protein: negative Uro: 0.2 Nitrates: negative Leukocytes: negative    Assessment & Plan:  Exam findings, diagnosis etiology and medication use and indications reviewed with patient. Follow- Up and discharge instructions provided. No emergent/urgent issues found on exam.  Discussed with patient at length the likely causes of the trace of blood in her urine.  Patient previously denied any history of kidney stones, but stated that she does exercise a lot.  Informed patient that this is not uncommon for someone who exercises to have blood in the urine.  Informed patient that I would like for her to come back to repeat the urinalysis in 2 days to see if the blood still shows up on urine dipstick and if she develops any other components within the urinalysis that may need treatment.  Further informed patient that if her urine dipstick remains positive for blood, I probably will send her to her PCP for further follow-up.  Patient verbalized understanding of information provided and agrees with plan of care (POC), all questions answered.  1. Vaginal discharge  - POCT Urinalysis Dipstick (Automated) -Follow up in 2 days for recheck.     2. Hematuria, unspecified type  -Follow up in 2 days for urine recheck. Will have patient follow up with PCP if urine is positive for hematuria  3. Acute midline low back pain without sciatica  - POCT Urinalysis Dipstick  (Automated) -Ibuprofen 800mg  every 8 hours for 3 days. -Increase fluids. -Apply ice to the low back  Approximately 3-4 times/day for the next 48 hours, then switch to heat for 2 days. -Rest from performing strenuous exercises for the next 5-7 days.  Perform the back exercises provided.

## 2017-09-09 ENCOUNTER — Encounter: Payer: Self-pay | Admitting: Nurse Practitioner

## 2017-09-09 ENCOUNTER — Ambulatory Visit (INDEPENDENT_AMBULATORY_CARE_PROVIDER_SITE_OTHER): Payer: Self-pay | Admitting: Nurse Practitioner

## 2017-09-09 ENCOUNTER — Telehealth: Payer: Self-pay

## 2017-09-09 VITALS — BP 138/94 | HR 71 | Temp 98.4°F | Resp 18 | Wt 214.0 lb

## 2017-09-09 DIAGNOSIS — M545 Low back pain, unspecified: Secondary | ICD-10-CM

## 2017-09-09 DIAGNOSIS — R311 Benign essential microscopic hematuria: Secondary | ICD-10-CM

## 2017-09-09 DIAGNOSIS — N898 Other specified noninflammatory disorders of vagina: Secondary | ICD-10-CM

## 2017-09-09 LAB — POCT URINALYSIS DIPSTICK
BILIRUBIN UA: NEGATIVE
Glucose, UA: NEGATIVE
KETONES UA: NEGATIVE
Leukocytes, UA: NEGATIVE
Nitrite, UA: NEGATIVE
PH UA: 5 (ref 5.0–8.0)
Protein, UA: NEGATIVE
RBC UA: NEGATIVE
Spec Grav, UA: 1.01 (ref 1.010–1.025)
UROBILINOGEN UA: 0.2 U/dL

## 2017-09-09 NOTE — Telephone Encounter (Signed)
I called and the line is busy.

## 2017-09-09 NOTE — Progress Notes (Signed)
   Subjective:    Patient ID: Candice Parker, female    DOB: 01/01/1971, 47 y.o.   MRN: [id number]  The patient is a 47 year old female who returns today for follow-up for a recheck of her urine.  Patient was seen on 09/07/2017, with complaints of vaginal discharge and low back pain.  The patient also complained of being lightheaded, nauseated, with sweats.  Today, the patient denies any symptoms.  The patient denies any vaginal discharge, is not lightheaded and no longer is nauseated.  The patient states she has taken a break from exercising.  Patient also states she is that she has been using ibuprofen as directed.  Patient rates her back pain today 2 out of a 10.  Patient has no other complaints at this time.  When patient was seen on 7/31, the patient did have trace blood in her urine.  Discussed with patient at that time that most likely it was due to her being physically active, as she had no other abnormal components on her urinalysis.   Reviewed the patient's past medical history, current medications, and allergies.  Review of Systems  Constitutional: Negative.   Respiratory: Negative.   Cardiovascular: Negative.   Gastrointestinal: Negative.   Genitourinary: Negative.   Neurological: Negative.       Objective:   Physical Exam  Constitutional: She is oriented to person, place, and time. She appears well-developed and well-nourished. No distress.  Neck: Normal range of motion. Neck supple.  Cardiovascular: Normal rate, regular rhythm and normal heart sounds.  Pulmonary/Chest: Effort normal and breath sounds normal.  Abdominal: Soft. Bowel sounds are normal.  Neurological: She is alert and oriented to person, place, and time.  Psychiatric: She has a normal mood and affect. Her behavior is normal.   Urinalysis: Blood: Negative Bili: Negative Uro: Negative Ketones: Negative Proteins: Negative Nitrates: Negative Glucose: Negative pH: 5.5 Specific gravity: < 1.005 Leukocytes:  Negative:  Color: Light yellow: Clarity: Clear     Assessment & Plan:  Low back pain Vaginal discharge  Exam findings, diagnosis etiology and medication use and indications reviewed with patient. Follow- Up and discharge instructions provided. No emergent/urgent issues found on exam.  The patient will continue use of ibuprofen as previously directed.  Patient may also use heat as needed.  Patient instructed to perform good stretching prior to exercising.  Patient needs no additional treatment at this time. Patient verbalized understanding of information provided and agrees with plan of care (POC), all questions answered.  The patient will follow-up as needed.

## 2017-09-09 NOTE — Patient Instructions (Signed)

## 2017-09-12 ENCOUNTER — Telehealth: Payer: Self-pay

## 2017-09-12 NOTE — Telephone Encounter (Signed)
I called and the phone number was busy.

## 2017-09-19 MED FILL — LISINOPRIL-HCTZ 20-12.5 MG: 20-12.5 | 90 days supply | Qty: 90 | Fill #1

## 2017-11-10 ENCOUNTER — Other Ambulatory Visit: Payer: Self-pay | Admitting: Family Medicine

## 2017-11-10 MED ORDER — AMPHETAMINE-DEXTROAMPHET ER 20 MG PO CP24: 20.0000 mg | ORAL_CAPSULE | ORAL | 0 refills | Status: DC

## 2017-11-10 NOTE — Telephone Encounter (Signed)
im going to refill this- but please go ahead and schedule her 3 month visit- I think we were planning on physical at that time

## 2017-11-11 ENCOUNTER — Telehealth: Payer: Self-pay | Admitting: Family Medicine

## 2017-11-11 MED FILL — ADDERALL XR 20 MG CAP SA: 20 | 90 days supply | Qty: 90 | Fill #0

## 2017-11-11 NOTE — Telephone Encounter (Signed)
New Message  Called pt and LVM to CB for 3 month f/u

## 2017-12-27 MED FILL — LISINOPRIL-HCTZ 20-12.5 MG: 20-12.5 | 90 days supply | Qty: 90 | Fill #2

## 2018-02-28 ENCOUNTER — Other Ambulatory Visit (HOSPITAL_COMMUNITY)
Admission: RE | Admit: 2018-02-28 | Discharge: 2018-02-28 | Disposition: A | Discharge status: Home / Self Care | Payer: No Typology Code available for payment source | Source: Ambulatory Visit | Attending: Family Medicine | Admitting: Family Medicine

## 2018-02-28 ENCOUNTER — Encounter: Payer: Self-pay | Admitting: Family Medicine

## 2018-02-28 ENCOUNTER — Ambulatory Visit (INDEPENDENT_AMBULATORY_CARE_PROVIDER_SITE_OTHER): Payer: No Typology Code available for payment source | Admitting: Family Medicine

## 2018-02-28 VITALS — BP 118/78 | HR 80 | Temp 98.0°F | Ht 67.5 in | Wt 222.0 lb

## 2018-02-28 DIAGNOSIS — R739 Hyperglycemia, unspecified: Secondary | ICD-10-CM | POA: Diagnosis not present

## 2018-02-28 DIAGNOSIS — Z113 Encounter for screening for infections with a predominantly sexual mode of transmission: Secondary | ICD-10-CM | POA: Insufficient documentation

## 2018-02-28 DIAGNOSIS — Z0000 Encounter for general adult medical examination without abnormal findings: Secondary | ICD-10-CM

## 2018-02-28 DIAGNOSIS — I10 Essential (primary) hypertension: Secondary | ICD-10-CM

## 2018-02-28 DIAGNOSIS — F988 Other specified behavioral and emotional disorders with onset usually occurring in childhood and adolescence: Secondary | ICD-10-CM

## 2018-02-28 DIAGNOSIS — Z118 Encounter for screening for other infectious and parasitic diseases: Secondary | ICD-10-CM

## 2018-02-28 DIAGNOSIS — Z114 Encounter for screening for human immunodeficiency virus [HIV]: Secondary | ICD-10-CM

## 2018-02-28 MED ORDER — ALBUTEROL SULFATE HFA 108 (90 BASE) MCG/ACT IN AERS: 1.0000 | INHALATION_SPRAY | Freq: Four times a day (QID) | RESPIRATORY_TRACT | 5 refills | Status: DC | PRN

## 2018-02-28 MED ORDER — ACYCLOVIR 400 MG PO TABS: 400.0000 mg | ORAL_TABLET | Freq: Every day | ORAL | 2 refills | Status: DC

## 2018-02-28 MED ORDER — AMPHETAMINE-DEXTROAMPHET ER 20 MG PO CP24: 20.0000 mg | ORAL_CAPSULE | ORAL | 0 refills | Status: DC

## 2018-02-28 MED ORDER — LISINOPRIL-HYDROCHLOROTHIAZIDE 20-12.5 MG PO TABS: 1.0000 | ORAL_TABLET | Freq: Every day | ORAL | 3 refills | Status: DC

## 2018-02-28 MED FILL — VENTOLIN HFA 90 MCG INHALER: 108 (90 BAS | 25 days supply | Qty: 18 | Fill #0

## 2018-02-28 MED FILL — ADDERALL XR 20 MG CAP SA: 20 | 90 days supply | Qty: 90 | Fill #0

## 2018-02-28 MED FILL — ACYCLOVIR 400 MG TABLET: 400 | 6 days supply | Qty: 30 | Fill #0

## 2018-02-28 NOTE — Addendum Note (Signed)
Addended by: Kayren Eaves T on: 02/28/2018 04:21 PM   Modules accepted: Orders

## 2018-02-28 NOTE — Assessment & Plan Note (Signed)
S: taking just during the week- managing on the weekends. Still doing online classes for [employer] online- meds really help with studying- she plans to be done next spring A/P: stable, well controlled- continue current meds - refilled 90 days of medicine today.  - can give 1 more refill then needs 6 month follow up -controlled substance contract 07/18/17 - original diagnosis by Dr. Cheryln Manly of [facility] approx 2010 - UDS reassuring 07/18/17

## 2018-02-28 NOTE — Progress Notes (Signed)
Phone: [phone]  Subjective:  Patient presents today for their annual physical. Chief complaint-noted.   See problem oriented charting- ROS- full  review of systems was completed and negative except for: some weight gain but was not eating well over the holidays as well as inattention issues-per baseline  The following were reviewed and entered/updated in epic: Past Medical History:  Diagnosis Date  . Acid reflux 12/10/2010  . ASTHMA, INTRINSIC 01/29/2009  . Breast lesion 09/29/2011  . Hypertension   . Overweight(278.02) 12/10/2010   Patient Active Problem List   Diagnosis Date Noted  . Attention deficit disorder (ADD) in adult 06/13/2014    Priority: High  . Left knee pain 06/13/2014    Priority: Medium  . HTN (hypertension) 07/17/2010    Priority: Medium  . Stress headache 07/18/2017    Priority: Low  . Left elbow pain 06/13/2014    Priority: Low  . Herpes simplex 06/13/2014    Priority: Low  . Chest pain, atypical 04/11/2011    Priority: Low  . Intermittent memory loss 02/18/2011    Priority: Low  . Obesity 12/10/2010    Priority: Low  . Asthma 01/29/2009    Priority: Low   Past Surgical History:  Procedure Laterality Date  . ELBOW FRACTURE SURGERY    . INNER EAR SURGERY    . TOTAL ABDOMINAL HYSTERECTOMY     fibroid uterus.    Family History  Problem Relation Age of Onset  . Hypertension Mother   . Hyperlipidemia Mother   . Heart failure Father        drug induced  . Kidney disease Father        failure  . Breast cancer Paternal Grandmother   . Breast cancer Maternal Grandmother   . Hypertension Brother     Medications- reviewed and updated Current Outpatient Medications  Medication Sig Dispense Refill  . acyclovir (ZOVIRAX) 400 MG tablet Take 1 tablet (400 mg total) by mouth 5 (five) times daily. For 5 days- take at first sign of cold sore 30 tablet 2  . Acyclovir-Hydrocortisone 5-1 % CREA Apply 5 times/day for 5 days 5 g 2  . albuterol  (PROVENTIL HFA;VENTOLIN HFA) 108 (90 Base) MCG/ACT inhaler Inhale 1-2 puffs into the lungs every 6 (six) hours as needed for wheezing or shortness of breath. 18 g 5  . amphetamine-dextroamphetamine (ADDERALL XR) 20 MG 24 hr capsule Take 1 capsule (20 mg total) by mouth every morning. 90 capsule 0  . ibuprofen (ADVIL,MOTRIN) 200 MG tablet Take 400 mg by mouth every 6 (six) hours as needed.    Marland Kitchen lisinopril-hydrochlorothiazide (PRINZIDE,ZESTORETIC) 20-12.5 MG tablet Take 1 tablet by mouth daily. 90 tablet 3  . Multiple Vitamin (MULTIVITAMIN) tablet Take 1 tablet by mouth daily. Women's One-A-Day     No current facility-administered medications for this visit.     Allergies-reviewed and updated No Known Allergies  Social History   Social History Narrative   Single. 78 year son in school at [employer] in 2018- doing [employer] type work still wants to play basketball overseas.       Works for [employer] at [employer] in [place]: enjoys going out to eat, travel     Objective: BP 118/78 (BP Location: Left Arm, Patient Position: Sitting, Cuff Size: Large)   Pulse 80   Temp 98 F (36.7 C) (Oral)   Ht 5' 7.5" (1.715 m)   Wt 222 lb (100.7  kg)   LMP  (LMP Unknown)   SpO2 99%   BMI 34.26 kg/m  Gen: NAD, resting comfortably HEENT: Mucous membranes are moist. Oropharynx normal Neck: no thyromegaly CV: RRR no murmurs rubs or gallops Lungs: CTAB no crackles, wheeze, rhonchi Abdomen: soft/nontender/nondistended/normal bowel sounds. No rebound or guarding.  Ext: no edema Skin: warm, dry Neuro: grossly normal, moves all extremities, PERRLA  Assessment/Plan:  48 y.o. female presenting for annual physical.  Health Maintenance counseling: 1. Anticipatory guidance: Patient counseled regarding regular dental exams -q6 months, eye exams - no issues,  avoiding smoking and second hand smoke , limiting alcohol to 1 beverage per day .     2. Risk factor reduction:  Advised patient of need for regular exercise and diet rich and fruits and vegetables to reduce risk of heart attack and stroke. Exercise- back working with personal trainer but shes going to be moving to [place] (her trainer)- may use her online option. Diet-loose over the holidays- had 10 days off and enjoyed eating- she is getting back on track after gaining a few lbs .  Wt Readings from Last 3 Encounters:  02/28/18 222 lb (100.7 kg)  09/09/17 214 lb (97.1 kg)  09/07/17 214 lb 12.8 oz (97.4 kg)  3. Immunizations/screenings/ancillary studies Immunization History  Administered Date(s) Administered  . Influenza-Unspecified 11/21/2013, 11/23/2014, 11/24/2015, 11/19/2016  . Td 10/02/2009  . Tdap 06/12/2015  4. Cervical cancer screening- not candidate as had total hysterectomy for fibroids including cervix 5. Breast cancer screening-  breast exam declines (prefers self exams only) and mammogram - scheduled for may 20th 6. Colon cancer screening - no family history, start at age 35 7. Skin cancer screening- advised regular sunscreen use. Denies worrisome, changing, or new skin lesions- has stable moles under breasts 8. Birth control/STD check- hysterectomy. Not dating- did have a one time episode unprotected sex with ex husband. Will do STD testing today. No current disharge- had seen urgent care 9. Osteoporosis screening at 51- will plan on this 10 never  smoker  Status of chronic or acute concerns   HTN (hypertension) S: controlled on lisinopril hctz 20-12.5mg  BP Readings from Last 3 Encounters:  02/28/18 118/78  09/09/17 (!) 138/94  09/07/17 122/80  A/P: We discussed blood pressure goal of <140/90. Continue current meds  Attention deficit disorder (ADD) in adult S: taking just during the week- managing on the weekends. Still doing online classes for [employer] online- meds really help with studying- she plans to be done next spring A/P: stable, well controlled-  continue current meds - refilled 90 days of medicine today.  - can give 1 more refill then needs 6 month follow up -controlled substance contract 07/18/17 - original diagnosis by Dr. Cheryln Manly of [facility] approx 2010 - UDS reassuring 07/18/17  Asthma- well controlled on sparing albuterol.   Luckily no recent cold sores- we will go ahead and refill acyclovir if needed so she will have updated rx  Will need referral for annual screening eye exam- she will let me know through mychart  Hyperglycemia- we will screen with A1c since patient is not fasting today  Future Appointments  Date Time Provider [place]  06/09/2018  7:00 AM [facility] MM 3 [facility]   1 year physical-56-month ADD follow-up  Lab/Order associations: coffee with protein powder, starbucks coffee, [place] mikes lunch [place] sandwich with bbq chips and cookie and water.    Preventative health care - Plan: RPR, Urine cytology ancillary only, HIV Antibody (routine testing  w rflx), CBC, Lipid panel, Comprehensive metabolic panel, Hemoglobin A1c  Essential hypertension - Plan: CBC, Lipid panel, Comprehensive metabolic panel  Attention deficit disorder (ADD) in adult  Hyperglycemia - Plan: Hemoglobin A1c  Screening for HIV (human immunodeficiency virus) - Plan: HIV Antibody (routine testing w rflx)  Screening examination for venereal disease - Plan: RPR  Screening for gonorrhea - Plan: Urine cytology ancillary only  Screening for chlamydial disease - Plan: Urine cytology ancillary only  Meds ordered this encounter  Medications  . acyclovir (ZOVIRAX) 400 MG tablet    Sig: Take 1 tablet (400 mg total) by mouth 5 (five) times daily. For 5 days- take at first sign of cold sore    Dispense:  30 tablet    Refill:  2  . albuterol (PROVENTIL HFA;VENTOLIN HFA) 108 (90 Base) MCG/ACT inhaler    Sig: Inhale 1-2 puffs into the lungs every 6 (six) hours as needed for wheezing or shortness of breath.     Dispense:  18 g    Refill:  5  . amphetamine-dextroamphetamine (ADDERALL XR) 20 MG 24 hr capsule    Sig: Take 1 capsule (20 mg total) by mouth every morning.    Dispense:  90 capsule    Refill:  0  . lisinopril-hydrochlorothiazide (PRINZIDE,ZESTORETIC) 20-12.5 MG tablet    Sig: Take 1 tablet by mouth daily.    Dispense:  90 tablet    Refill:  3    Return precautions advised.  Garret Reddish, MD

## 2018-02-28 NOTE — Assessment & Plan Note (Signed)
S: controlled on lisinopril hctz 20-12.5mg  BP Readings from Last 3 Encounters:  02/28/18 118/78  09/09/17 (!) 138/94  09/07/17 122/80  A/P: We discussed blood pressure goal of <140/90. Continue current meds

## 2018-02-28 NOTE — Patient Instructions (Addendum)
Please stop by lab before you go  Refilled ADD meds- see [place] in 6 months. We can do another refill by mychart or through your pharmacy in 3 months  Let us know who you need referral to for eye doctor through [place] as well and we are happy to approve that or place referral

## 2018-03-01 LAB — COMPREHENSIVE METABOLIC PANEL
ALT: 12 U/L (ref 0–35)
AST: 18 U/L (ref 0–37)
Albumin: 3.9 g/dL (ref 3.5–5.2)
Alkaline Phosphatase: 60 U/L (ref 39–117)
BUN: 15 mg/dL (ref 6–23)
CO2: 29 meq/L (ref 19–32)
Calcium: 9.3 mg/dL (ref 8.4–10.5)
Chloride: 103 mEq/L (ref 96–112)
Creatinine, Ser: 0.88 mg/dL (ref 0.40–1.20)
GFR: 83.84 mL/min (ref >60.00)
Glucose, Bld: 90 mg/dL (ref 70–99)
Potassium: 3.9 mEq/L (ref 3.5–5.1)
Sodium: 138 mEq/L (ref 135–145)
Total Bilirubin: 0.5 mg/dL (ref 0.2–1.2)
Total Protein: 7 g/dL (ref 6.0–8.3)

## 2018-03-01 LAB — CBC
HEMATOCRIT: 39.6 % (ref 36.0–46.0)
Hemoglobin: 13.5 g/dL (ref 12.0–15.0)
MCHC: 34.2 g/dL (ref 30.0–36.0)
MCV: 89.5 fl (ref 78.0–100.0)
Platelets: 307 10*3/uL (ref 150.0–400.0)
RBC: 4.43 Mil/uL (ref 3.87–5.11)
RDW: 12.7 % (ref 11.5–15.5)
WBC: 7.9 10*3/uL (ref 4.0–10.5)

## 2018-03-01 LAB — LIPID PANEL
CHOL/HDL RATIO: 3
Cholesterol: 152 mg/dL (ref 0–200)
HDL: 53.1 mg/dL (ref >39.00)
LDL Cholesterol: 74 mg/dL (ref 0–99)
NonHDL: 99.29
Triglycerides: 128 mg/dL (ref 0.0–149.0)
VLDL: 25.6 mg/dL (ref 0.0–40.0)

## 2018-03-01 LAB — HEMOGLOBIN A1C: Hgb A1c MFr Bld: 5 % (ref 4.6–6.5)

## 2018-03-01 LAB — RPR: RPR Ser Ql: NONREACTIVE

## 2018-03-01 LAB — HIV ANTIBODY (ROUTINE TESTING W REFLEX): HIV: NONREACTIVE

## 2018-03-02 LAB — URINE CYTOLOGY ANCILLARY ONLY
Chlamydia: NEGATIVE
Neisseria Gonorrhea: NEGATIVE
Trichomonas: NEGATIVE

## 2018-04-03 MED FILL — LISINOPRIL-HCTZ 20-12.5 MG: 20-12.5 | 90 days supply | Qty: 90 | Fill #3

## 2018-04-21 ENCOUNTER — Other Ambulatory Visit: Payer: Self-pay | Admitting: Family Medicine

## 2018-04-21 DIAGNOSIS — Z1231 Encounter for screening mammogram for malignant neoplasm of breast: Secondary | ICD-10-CM

## 2018-07-04 ENCOUNTER — Other Ambulatory Visit: Payer: Self-pay | Admitting: Family Medicine

## 2018-07-04 MED ORDER — AMPHETAMINE-DEXTROAMPHET ER 20 MG PO CP24: 20.0000 mg | ORAL_CAPSULE | ORAL | 0 refills | Status: DC

## 2018-07-04 NOTE — Telephone Encounter (Signed)
Pt last OV was 02/28/2018 CPE   Pt last refill was 02/28/2018  NOV 08/29/2018 for 6 month f/u  Okay for refill?   Please advise

## 2018-07-05 MED FILL — ADDERALL XR 20 MG CAP SA: 20 | 90 days supply | Qty: 90 | Fill #0

## 2018-07-24 MED FILL — LISINOPRIL-HCTZ 20-12.5 MG: 20-12.5 | 90 days supply | Qty: 90 | Fill #0

## 2018-08-02 ENCOUNTER — Ambulatory Visit
Admission: RE | Admit: 2018-08-02 | Discharge: 2018-08-02 | Disposition: A | Discharge status: Home / Self Care | Payer: No Typology Code available for payment source | Source: Ambulatory Visit | Attending: Family Medicine | Admitting: Family Medicine

## 2018-08-02 ENCOUNTER — Other Ambulatory Visit: Payer: Self-pay

## 2018-08-02 DIAGNOSIS — Z1231 Encounter for screening mammogram for malignant neoplasm of breast: Secondary | ICD-10-CM

## 2018-08-29 ENCOUNTER — Encounter: Payer: Self-pay | Admitting: Family Medicine

## 2018-08-29 ENCOUNTER — Telehealth: Payer: Self-pay | Admitting: Physical Therapy

## 2018-08-29 ENCOUNTER — Ambulatory Visit (INDEPENDENT_AMBULATORY_CARE_PROVIDER_SITE_OTHER): Payer: No Typology Code available for payment source | Admitting: Family Medicine

## 2018-08-29 VITALS — BP 144/88 | HR 78 | Temp 98.1°F | Ht 67.5 in | Wt 219.5 lb

## 2018-08-29 DIAGNOSIS — I10 Essential (primary) hypertension: Secondary | ICD-10-CM | POA: Diagnosis not present

## 2018-08-29 DIAGNOSIS — J4520 Mild intermittent asthma, uncomplicated: Secondary | ICD-10-CM

## 2018-08-29 DIAGNOSIS — F988 Other specified behavioral and emotional disorders with onset usually occurring in childhood and adolescence: Secondary | ICD-10-CM

## 2018-08-29 DIAGNOSIS — E669 Obesity, unspecified: Secondary | ICD-10-CM | POA: Diagnosis not present

## 2018-08-29 MED ORDER — AMPHETAMINE-DEXTROAMPHET ER 20 MG PO CP24: 20.0000 mg | ORAL_CAPSULE | ORAL | 0 refills | Status: DC

## 2018-08-29 NOTE — Patient Instructions (Addendum)
There are no preventive care reminders to display for this patient.  Depression screen [facility] 2/9 08/29/2018 07/18/2017 05/06/2017  Decreased Interest 0 0 3  Down, Depressed, Hopeless 0 0 3  PHQ - 2 Score 0 0 6  Altered sleeping 0 0 3  Tired, decreased energy 0 0 3  Change in appetite 1 0 1  Feeling bad or failure about yourself  0 0 1  Trouble concentrating 0 0 1  Moving slowly or fidgety/restless 0 0 1  Suicidal thoughts 0 0 0  PHQ-9 Score 1 0 16  Difficult doing work/chores - - Somewhat difficult

## 2018-08-29 NOTE — Telephone Encounter (Signed)
Copied from [place] [phone]. Topic: General - Other >> Aug 29, 2018  7:44 AM Keene Breath wrote: Reason for CRM: Patient called back to confirm that her appt. Is virtual.  She stated that she will wait for the call for the appt. This morning.

## 2018-08-29 NOTE — Progress Notes (Signed)
Phone [phone]   Subjective:  Virtual visit via Video note. Chief complaint: Chief Complaint  Patient presents with  . ADHD  . Hypertension  . Asthma   This visit type was conducted due to national recommendations for restrictions regarding the COVID-19 Pandemic (e.g. social distancing).  This format is felt to be most appropriate for this patient at this time balancing risks to patient and risks to population by having him in for in person visit.  No physical exam was performed (except for noted visual exam or audio findings with Telehealth visits).    Our team/I connected with Alvin Critchley at  8:00 AM EDT by a video enabled telemedicine application (doxy.me or caregility through epic) and verified that I am speaking with the correct person using two identifiers.  Location patient: [facility] Location provider: [facility], office Persons participating in the virtual visit:  patient  Our team/I discussed the limitations of evaluation and management by telemedicine and the availability of in person appointments. In light of current covid-19 pandemic, patient also understands that we are trying to protect them by minimizing in office contact if at all possible.  The patient expressed consent for telemedicine visit and agreed to proceed. Patient understands insurance will be billed.   ROS- No chest pain or shortness of breath. No headache or blurry vision.    Past Medical History-  Patient Active Problem List   Diagnosis Date Noted  . Attention deficit disorder (ADD) in adult 06/13/2014    Priority: High  . Left knee pain 06/13/2014    Priority: Medium  . HTN (hypertension) 07/17/2010    Priority: Medium  . Stress headache 07/18/2017    Priority: Low  . Left elbow pain 06/13/2014    Priority: Low  . Herpes simplex 06/13/2014    Priority: Low  . Chest pain, atypical 04/11/2011    Priority: Low  . Intermittent memory loss 02/18/2011    Priority: Low  . Obesity 12/10/2010   Priority: Low  . Asthma 01/29/2009    Priority: Low    Medications- reviewed and updated Current Outpatient Medications  Medication Sig Dispense Refill  . acyclovir (ZOVIRAX) 400 MG tablet Take 1 tablet (400 mg total) by mouth 5 (five) times daily. For 5 days- take at first sign of cold sore 30 tablet 2  . Acyclovir-Hydrocortisone 5-1 % CREA Apply 5 times/day for 5 days 5 g 2  . albuterol (PROVENTIL HFA;VENTOLIN HFA) 108 (90 Base) MCG/ACT inhaler Inhale 1-2 puffs into the lungs every 6 (six) hours as needed for wheezing or shortness of breath. 18 g 5  . amphetamine-dextroamphetamine (ADDERALL XR) 20 MG 24 hr capsule Take 1 capsule (20 mg total) by mouth every morning. 90 capsule 0  . ibuprofen (ADVIL,MOTRIN) 200 MG tablet Take 400 mg by mouth every 6 (six) hours as needed.    Marland Kitchen lisinopril-hydrochlorothiazide (PRINZIDE,ZESTORETIC) 20-12.5 MG tablet Take 1 tablet by mouth daily. 90 tablet 3  . Multiple Vitamin (MULTIVITAMIN) tablet Take 1 tablet by mouth daily. Women's One-A-Day     No current facility-administered medications for this visit.      Objective:  Wt 219 lb 8 oz (99.6 kg)   LMP  (LMP Unknown)   BMI 33.87 kg/m  self reported vitals Gen: NAD, resting comfortably Lungs: nonlabored, normal respiratory rate  Skin: appears dry, no obvious rash    Assessment and Plan   # stress with work- #s down- hard to know whether to furlough people ore not.   #hypertension S:  controlled on Lisinopril-HTCZ 20-12.5 mg daily typically- she has not checked BP yet today A/P: hopefully stable- she will get this checked later this morning at work and will send [place] a mychart message  # Obesity  S:had gained some with covid 79 and then lost with recent illness. Not exercising currently - is going to try to do virtual visits with her old trainer. 1-2 x a week right now had been much more. Feels like making some poor food choices- protein content has been lower and feels less full. Hair seems to be  thinning.  Wt Readings from Last 3 Encounters:  08/29/18 219 lb 8 oz (99.6 kg)  02/28/18 222 lb (100.7 kg)  09/09/17 214 lb (97.1 kg)  A/P: at least stable despite covid 19- Encouraged need for healthy eating, regular exercise, weight loss. Set 5 lb goal over next month 5 lbs.   # Asthma S:Using Albuterol HFA prn.  No recent issues A/P: Stable. Continue current medications.   # Attention Deficit Disorder in Adult S:compliant with Adderall XR 20 mg daily.  Controlling symptoms well - helps her manage both school at [employer] (she didn't do as well as she wanted in the summer session due to work stresses)  and work - mainly takes during the week - controlled substance contract 07/18/17  -UDS 07/18/17  - original diagnosis by Dr. Cheryln Manly of [facility] A/P: doing well- continue current medicine. Refilled #90 today but this may last her until next visit given 4.5 months between last refills - and last refill late may.   Recommended follow up: 6 month follow up   Lab/Order associations:   ICD-10-CM   1. Attention deficit disorder (ADD) in adult  F98.8   2. Essential hypertension  I10   3. Obesity (BMI 30-39.9)  E66.9   4. Mild intermittent asthma without complication  W97.94    Meds ordered this encounter  Medications  . amphetamine-dextroamphetamine (ADDERALL XR) 20 MG 24 hr capsule    Sig: Take 1 capsule (20 mg total) by mouth every morning.    Dispense:  90 capsule    Refill:  0   Return precautions advised.  Garret Reddish, MD

## 2018-08-29 NOTE — Telephone Encounter (Signed)
Pt is on her virtual visit w/ Dr. Yong Channel so [facility] aware.

## 2018-10-03 NOTE — Patient Instructions (Addendum)
See rotator cuff handout- do exercises 2-3x a week for a month  Early on with wrist pain try to go ahead and stop the activity you are doing.   Seems like muscle spasm in low back- lets have you try muscle relaxant before bed for a week. If no better or worsens- let us refer you to Dr. Tamala Julian of sports medicine  If blood pressure tolerates it with future headaches- if tylenol doesn't work - can try ibuprofen 400-600mg  twice a day for 1-2 days max.   We will reach out when we have results from vaginal test- hopefully by end of the week  Please stop by lab before you go If you do not have mychart- we will call you about results within 5 business days of [place] receiving them.  If you have mychart- we will send your results within 3 business days of [place] receiving them.  If abnormal or we want to clarify a result, we will call or mychart you to make sure you receive the message.  If you have questions or concerns or don't hear within 5-7 days, please send [place] a message or call us.

## 2018-10-03 NOTE — Progress Notes (Signed)
Phone [phone]   Subjective:  Candice Parker is a 48 y.o. year old very pleasant female patient who presents for/with See problem oriented charting Chief Complaint  Patient presents with  . Headache    sx 3 days. Pt taking tylenol 250-500 mg tid w/o relief.   . Back Pain    Sx started a week ago. pt denies pain radiating. Pain noticed more in the am.   . Exposure to STD    Pt stated she has some sx of possible Std but states they are not consistant but want to rule it out   ROS- no fever/chills. See ros below.    Past Medical History-  Patient Active Problem List   Diagnosis Date Noted  . Attention deficit disorder (ADD) in adult 06/13/2014    Priority: High  . Left knee pain 06/13/2014    Priority: Medium  . HTN (hypertension) 07/17/2010    Priority: Medium  . Stress headache 07/18/2017    Priority: Low  . Left elbow pain 06/13/2014    Priority: Low  . Herpes simplex 06/13/2014    Priority: Low  . Chest pain, atypical 04/11/2011    Priority: Low  . Intermittent memory loss 02/18/2011    Priority: Low  . Obesity 12/10/2010    Priority: Low  . Asthma 01/29/2009    Priority: Low    Medications- reviewed and updated Current Outpatient Medications  Medication Sig Dispense Refill  . acyclovir (ZOVIRAX) 400 MG tablet Take 1 tablet (400 mg total) by mouth 5 (five) times daily. For 5 days- take at first sign of cold sore 30 tablet 2  . Acyclovir-Hydrocortisone 5-1 % CREA Apply 5 times/day for 5 days 5 g 2  . albuterol (PROVENTIL HFA;VENTOLIN HFA) 108 (90 Base) MCG/ACT inhaler Inhale 1-2 puffs into the lungs every 6 (six) hours as needed for wheezing or shortness of breath. 18 g 5  . amphetamine-dextroamphetamine (ADDERALL XR) 20 MG 24 hr capsule Take 1 capsule (20 mg total) by mouth every morning. 90 capsule 0  . ibuprofen (ADVIL,MOTRIN) 200 MG tablet Take 400 mg by mouth every 6 (six) hours as needed.    Marland Kitchen lisinopril-hydrochlorothiazide (PRINZIDE,ZESTORETIC) 20-12.5 MG  tablet Take 1 tablet by mouth daily. 90 tablet 3  . Multiple Vitamin (MULTIVITAMIN) tablet Take 1 tablet by mouth daily. Women's One-A-Day    . cyclobenzaprine (FLEXERIL) 5 MG tablet Take 1 tablet (5 mg total) by mouth at bedtime as needed for muscle spasms. 20 tablet 0   No current facility-administered medications for this visit.      Objective:  BP 128/86   Pulse 79   Temp 98 F (36.7 C) (Oral)   Ht 5' 7.5" (1.715 m)   Wt 223 lb 12.8 oz (101.5 kg)   LMP  (LMP Unknown)   SpO2 99%   BMI 34.53 kg/m  Gen: NAD, resting comfortably CV: RRR no murmurs rubs or gallops Lungs: CTAB no crackles, wheeze, rhonchi Abdomen: soft/nontender/nondistended/normal bowel sounds.  Pelvic: vaginal cuff normal in appearance. White to yellowish discharge noted.  Ext: no edema Skin: warm, dry Back - Normal skin, Spine with normal alignment and no deformity.  No tenderness to vertebral process palpation.  Paraspinous muscles tender and with spasm.   Range of motion is full at neck and lumbar sacral regions. Negative Straight leg raise.  Neuro- no saddle anesthesia, 5/5 strength lower extremities, 2+ reflexes     Assessment and Plan   # Headaches S:  Symptoms for 3 days. Has  been taking tylenol 500mg  TID without relief. Pain level up to 7-8/10. This moment has resolved- resolved on Friday (had to work from home thurs/friday as didn't feel comfortable driving due to pain). No aura. Does feel like if staring at a screen things will be less clear over time. Frontal headache and into top of head.   Stress has continued to be high- boss texting her even during visit trying to get better A/P: likely tension/stress headache.  Fortunately headache is resolved-we discussed could use sparing ibuprofen if needed in the future as long as blood pressure tolerated it.  Did have prior MRI in 2019 due to severe headache which was reassuring.  # Lower Back Pain without sciatica S: symptoms started a week ago. No  radiation. Worse in AM. Tends to be more towards middle of back. 7-8/10 pain. Gets better as day goes on. No recent change in mattress (wonders if times for new one). No fall or injury. Usually can cut front and back yard but when she tried yesterday pain got too bad. No history of back imaging  ROS-No saddle anesthesia, bladder incontinence, fecal incontinence, weakness in extremity, numbness or tingling in extremity. History negative for trauma, history of cancer, fever, chills, unintentional weight loss, recent bacterial infection, recent IV drug use, HIV, pain worse at night or while supine.  A/P: Suspect muscular strain-patient is tight in paraspinous muscles.  In reflection she does admit to trying to move furniture around in her home recently.  Offered sports medicine referral-declines for now but will let us know if wants to try this at a later date.  Ultimately we decided to try muscle relaxant before bed since she seems to be tight in the morning  # right wrist pain/right shoulder pain S:having periods of pain in her wrist that seem to radiate up the arms at times (from ulnar portion of wrist up to medial epicondyle). Fair amount of typing and writing . Pain can be 8-9/10 - rubs it and moves arm and goes away within an hour. No numbness/tingling in fingers.  Also admits to some pain in her shoulder when rotates the arm A/P: I think she likely has a positional issue in relation to her right wrist pain associated with overuse-cannot reproduce pain today.  We discussed trying to get out of position that is bothering her sooner-she seems to wait things out and then things worsen.  I also think she may have a mild rotator cuff issue-recommended home exercises and given exercise regimen from the sports medicine advisor  # STD screening/vaginal odor S: noted a fishy smell that seems to come and go. Did douche a few times (she knows to avoid). Has had unprotected sex.  Some itching- thought was from  different body wash.  A/P: As always would advise protected sex.  We did complete cervical ancillary testing for gonorrhea/chlamydia/trichomonas/BV/Candida.  #hypertension S: controlled on lisinopril hydrochlorothiazide 20-12.5 mg today.  Reports blood pressure running in the 140s at home.  Also had checked at work and was somewhat high BP Readings from Last 3 Encounters:  10/04/18 128/86  08/29/18 (!) 144/88  02/28/18 118/78  A/P: Encouraged patient to have her home cuff verified at work- if her cuff is accurate and blood pressure remains high at home-may need to adjust medications   Recommended follow up: As needed for acute issues  Lab/Order associations:   ICD-10-CM   1. Acute bilateral low back pain without sciatica  M54.5   2. Stress headache  F45.41  3. Vaginal odor  N89.8 Cervicovaginal ancillary only( [place])    CANCELED: Cytology - PAP  4. Essential hypertension  I10   5. Screening for HIV (human immunodeficiency virus)  Z11.4 HIV Antibody (routine testing w rflx)  6. Screening examination for venereal disease  Z11.3 RPR    CANCELED: Cytology - PAP  7. Screening for gonorrhea  Z11.3 Cervicovaginal ancillary only( [place])    CANCELED: Cytology - PAP  8. Screening for chlamydial disease  Z11.8 Cervicovaginal ancillary only( [place])    CANCELED: Cytology - PAP   Meds ordered this encounter  Medications  . cyclobenzaprine (FLEXERIL) 5 MG tablet    Sig: Take 1 tablet (5 mg total) by mouth at bedtime as needed for muscle spasms.    Dispense:  20 tablet    Refill:  0   Return precautions advised.  Garret Reddish, MD

## 2018-10-04 ENCOUNTER — Encounter: Payer: Self-pay | Admitting: Family Medicine

## 2018-10-04 ENCOUNTER — Other Ambulatory Visit (HOSPITAL_COMMUNITY)
Admission: RE | Admit: 2018-10-04 | Discharge: 2018-10-04 | Disposition: A | Discharge status: Home / Self Care | Payer: No Typology Code available for payment source | Source: Ambulatory Visit | Attending: Family Medicine | Admitting: Family Medicine

## 2018-10-04 ENCOUNTER — Ambulatory Visit (INDEPENDENT_AMBULATORY_CARE_PROVIDER_SITE_OTHER): Payer: No Typology Code available for payment source | Admitting: Family Medicine

## 2018-10-04 ENCOUNTER — Other Ambulatory Visit: Payer: Self-pay

## 2018-10-04 VITALS — BP 128/86 | HR 79 | Temp 98.0°F | Ht 67.5 in | Wt 223.8 lb

## 2018-10-04 DIAGNOSIS — Z118 Encounter for screening for other infectious and parasitic diseases: Secondary | ICD-10-CM | POA: Insufficient documentation

## 2018-10-04 DIAGNOSIS — N898 Other specified noninflammatory disorders of vagina: Secondary | ICD-10-CM

## 2018-10-04 DIAGNOSIS — M545 Low back pain, unspecified: Secondary | ICD-10-CM

## 2018-10-04 DIAGNOSIS — Z113 Encounter for screening for infections with a predominantly sexual mode of transmission: Secondary | ICD-10-CM | POA: Insufficient documentation

## 2018-10-04 DIAGNOSIS — Z114 Encounter for screening for human immunodeficiency virus [HIV]: Secondary | ICD-10-CM | POA: Diagnosis not present

## 2018-10-04 DIAGNOSIS — F4541 Pain disorder exclusively related to psychological factors: Secondary | ICD-10-CM | POA: Diagnosis not present

## 2018-10-04 DIAGNOSIS — I10 Essential (primary) hypertension: Secondary | ICD-10-CM

## 2018-10-04 MED ORDER — CYCLOBENZAPRINE HCL 5 MG PO TABS: 5.0000 mg | ORAL_TABLET | Freq: Every evening | ORAL | 0 refills | Status: DC | PRN

## 2018-10-04 MED FILL — CYCLOBENZAPRINE 5 MG TABLET: 5 | 20 days supply | Qty: 20 | Fill #0

## 2018-10-05 LAB — RPR: RPR Ser Ql: NONREACTIVE

## 2018-10-05 LAB — HIV ANTIBODY (ROUTINE TESTING W REFLEX): HIV 1&2 Ab, 4th Generation: NONREACTIVE

## 2018-10-07 LAB — CERVICOVAGINAL ANCILLARY ONLY
Candida vaginitis: NEGATIVE
Chlamydia: NEGATIVE
Neisseria Gonorrhea: NEGATIVE
Trichomonas: NEGATIVE

## 2018-10-09 MED ORDER — METRONIDAZOLE 500 MG PO TABS: 500.0000 mg | ORAL_TABLET | Freq: Two times a day (BID) | ORAL | 0 refills | Status: DC

## 2018-10-09 MED FILL — metroNIDAZOLE 500 MG TABS: 500 | 7 days supply | Qty: 14 | Fill #0

## 2018-10-09 NOTE — Addendum Note (Signed)
Addended by: Marin Olp on: 10/09/2018 01:06 PM   Modules accepted: Orders

## 2018-10-24 ENCOUNTER — Other Ambulatory Visit: Payer: Self-pay | Admitting: Family Medicine

## 2018-10-25 MED FILL — ADDERALL XR 20 MG CAP SA: 20 | 90 days supply | Qty: 90 | Fill #0

## 2018-10-25 MED FILL — LISINOPRIL-HCTZ 20-12.5 MG: 20-12.5 | 90 days supply | Qty: 90 | Fill #1

## 2018-10-25 NOTE — Telephone Encounter (Signed)
Last OV 10/04/18 Last refill 08/29/18 #90/0 Next OV not scheduled  Forwarding to Dr. Yong Channel

## 2018-10-25 NOTE — Telephone Encounter (Signed)
I dont mind refilling but appears to be a month early- does she not have about 30 pills left?

## 2018-12-29 MED FILL — ACYCLOVIR 400 MG TABLET: 400 | 6 days supply | Qty: 30 | Fill #1

## 2019-01-17 ENCOUNTER — Encounter: Payer: Self-pay | Admitting: Family Medicine

## 2019-01-25 ENCOUNTER — Encounter: Payer: Self-pay | Admitting: Family Medicine

## 2019-01-26 ENCOUNTER — Other Ambulatory Visit: Payer: Self-pay

## 2019-01-26 DIAGNOSIS — M79603 Pain in arm, unspecified: Secondary | ICD-10-CM

## 2019-02-12 ENCOUNTER — Other Ambulatory Visit: Payer: Self-pay

## 2019-02-12 ENCOUNTER — Ambulatory Visit (INDEPENDENT_AMBULATORY_CARE_PROVIDER_SITE_OTHER): Payer: No Typology Code available for payment source | Admitting: Family Medicine

## 2019-02-12 ENCOUNTER — Ambulatory Visit: Payer: Self-pay

## 2019-02-12 ENCOUNTER — Encounter: Payer: Self-pay | Admitting: Family Medicine

## 2019-02-12 VITALS — BP 122/84 | HR 88 | Ht 67.5 in | Wt 234.6 lb

## 2019-02-12 DIAGNOSIS — M25511 Pain in right shoulder: Secondary | ICD-10-CM

## 2019-02-12 DIAGNOSIS — M7581 Other shoulder lesions, right shoulder: Secondary | ICD-10-CM | POA: Diagnosis not present

## 2019-02-12 MED ORDER — NITROGLYCERIN 0.2 MG/HR TD PT24: MEDICATED_PATCH | TRANSDERMAL | 1 refills | Status: DC

## 2019-02-12 MED FILL — NITROGLYCERIN 0.2 MG/HR PTC: 0.2 | 28 days supply | Qty: 7 | Fill #0

## 2019-02-12 NOTE — Progress Notes (Signed)
Subjective:    I'm seeing this patient as a consultation for:  Dr. Yong Channel. Note will be routed back to referring provider/PCP.  CC: R arm pain  I, Molly Weber, LAT, ATC, am serving as scribe for Dr. Lynne Leader.  HPI: Pt is a 49 y/o RHD female presenting w/ c/o R shoulder pain x approximately 2 months w/ no known MOI.  Her pain is located at her R anterior shoulder and will intermittently radiate into her R upper arm.  She currently rates her pain at a 0/10 but was a 5/10 this morning when she woke up.  She describes her pain as an aching pain.  Aggravating factors include stretching/reaching motions, functional IR behind her back.  She has tried one muscle relaxer prescribed by Dr. Yong Channel but doesn't like the side effects.  She has tried heat and IBU.  She denies any R shoulder mechanical symptoms or neck pain.  She does have intermittent numbness/tingling in her R UE to her R wrist.  Past medical history, Surgical history, Family history not pertinant except as noted below, Social history, Allergies, and medications have been entered into the medical record, reviewed, and no changes needed.   Review of Systems: No headache, visual changes, nausea, vomiting, diarrhea, constipation, dizziness, abdominal pain, skin rash, fevers, chills, night sweats, weight loss, swollen lymph nodes, body aches, joint swelling, muscle aches, chest pain, shortness of breath, mood changes, visual or auditory hallucinations.   Objective:    Vitals:   02/12/19 0809  BP: 122/84  Pulse: 88  SpO2: 99%   General: Well Developed, well nourished, and in no acute distress.  Neuro/Psych: Alert and oriented x3, extra-ocular muscles intact, able to move all 4 extremities, sensation grossly intact. Skin: Warm and dry, no rashes noted.  Respiratory: Not using accessory muscles, speaking in full sentences, trachea midline.  Cardiovascular: Pulses palpable, no extremity edema. Abdomen: Does not appear distended. MSK:    C-spine: Normal-appearing nontender normal motion normal strength.  Right shoulder: Normal-appearing Not particularly tender to palpation. Range of motion: Abduction full pain beyond 100 degrees.  External rotation 30 degrees beyond neutral position.  Internal rotation to lumbar spine. Strength intact abduction external and internal rotation. Positive Hawkins and Neer's test.  Mildly positive empty can test.  Negative Yergason's and speeds test.  Left shoulder: Normal-appearing Nontender. Normal motion.  Internal rotation to thoracic spine. Intact strength. Negative impingement and biceps tendinitis testing.  Pulses cap refill and sensation intact distally.  Lab and Radiology Results  Limited musculoskeletal ultrasound right shoulder. Biceps tendon intact normal-appearing in bicipital groove. Subscapularis tendon with slight increase in bursa thickness.  Tendon intact normal-appearing Supraspinatus tendon increase subacromial bursa thickness.  Possible partial thickness bursal sided tear measuring approximately 1 cm.  No significant gap. Infraspinatus normal-appearing AC joint mild effusion. Impression: Possible partial-thickness mid body bursal surface supraspinatus tear. Rotator cuff tendinitis. Subacromial bursitis.  Impression and Recommendations:    Assessment and Plan: 49 y.o. female with  Right shoulder pain ongoing for several months interfering with activities of daily living.  Fortunately preserved strength and largely preserved motion. Ultrasound today reveals possible supraspinatus tear but definite subacromial bursitis. Discussed treatment plan and options.  Plan for physical therapy and nitroglycerin patch protocol.  Check back in 1 month.  Return sooner if needed..   Orders Placed This Encounter  Procedures  . No Chg [place] Lower Rt    Order Specific Question:   Reason for Exam (SYMPTOM  OR DIAGNOSIS REQUIRED)  Answer:   rt shoulder pain    Order Specific  Question:   Preferred imaging location?    Answer:   [facility]    Order Specific Question:   Release to patient    Answer:   Immediate  . Ambulatory referral to Physical Therapy    Referral Priority:   Routine    Referral Type:   Physical Medicine    Referral Reason:   Specialty Services Required    Requested Specialty:   Physical Therapy   Meds ordered this encounter  Medications  . nitroGLYCERIN (NITRODUR - DOSED IN MG/24 HR) 0.2 mg/hr patch    Sig: Apply 1/4 patch daily to right shoulder tendon for tendonitis.    Dispense:  30 patch    Refill:  1    Discussed warning signs or symptoms. Please see discharge instructions. Patient expresses understanding.   The above documentation has been reviewed and is accurate and complete Lynne Leader

## 2019-02-12 NOTE — Patient Instructions (Addendum)
Thank you for coming in today.  I think you have a small rotator cuff tear.  Attend PT.  Recheck with me in 1 month.  Return sooner if needed.   Nitroglycerin Protocol   Apply 1/4 nitroglycerin patch to affected area daily.  Change position of patch within the affected area every 24 hours.  You may experience a headache during the first 1-2 weeks of using the patch, these should subside.  If you experience headaches after beginning nitroglycerin patch treatment, you may take your preferred over the counter pain reliever.  Another side effect of the nitroglycerin patch is skin irritation or rash related to patch adhesive.  Please notify our office if you develop more severe headaches or rash, and stop the patch.  Tendon healing with nitroglycerin patch may require 12 to 24 weeks depending on the extent of injury.  Men should not use if taking Viagra, Cialis, or Levitra.   Do not use if you have migraines or rosacea.     Rotator Cuff Tear  A rotator cuff tear is a partial or complete tear of the cord-like bands (tendons) that connect muscle to bone in the rotator cuff. The rotator cuff is a group of muscles and tendons that surround the shoulder joint and keep the upper arm bone (humerus) in the shoulder socket. The tear can occur suddenly (acute tear) or can develop over a long period of time (chronic tear). What are the causes? Acute tears may be caused by:  A fall, especially on an outstretched arm.  Lifting very heavy objects with a jerking motion. Chronic tears may be caused by overuse of the muscles. This may happen in sports, physical work, or activities in which your arm repeatedly moves over your head. What increases the risk? This condition is more likely to occur in:  Athletes and workers who frequently use their shoulder or reach over their heads. This may include activities such as: ? Tennis. ? Baseball and softball. ? Swimming and  rowing. ? Weightlifting. ? Architect work. ? Painting.  People who smoke.  Older people who have arthritis or poor blood supply. These can make the muscles and tendons weaker. What are the signs or symptoms? Symptoms of this condition depend on the type and severity of the injury:  An acute tear may include a sudden tearing feeling, followed by severe pain that goes from your upper shoulder, down your arm, and toward your elbow.  A chronic tear includes a gradual weakness and decreased shoulder motion as the pain gets worse. The pain is usually worse at night. Both types may have symptoms such as:  Pain that spreads (radiates) from the shoulder to the upper arm.  Swelling and tenderness in front of the shoulder.  Decreased range of motion.  Pain when: ? Reaching, pulling, or lifting the arm above the head. ? Lowering the arm from above the head.  Not being able to raise your arm out to the side.  Difficulty placing the arm behind your back. How is this diagnosed? This condition is diagnosed with a medical history and physical exam. Imaging tests may also be done, including:  X-rays.  MRI.  Ultrasound.  CT or MR arthrogram. During this test, a contrast material is injected into your shoulder and then images are taken. How is this treated? Treatment for this condition depends on the type and severity of the condition. In less severe cases, treatment may include:  Rest. This may be done with a sling that holds  the shoulder still (immobilization). Your health care provider may also recommend avoiding activities that involve lifting your arm over your head.  Icing the shoulder.  Anti-inflammatory medicines, such as aspirin or ibuprofen.  Strengthening and stretching exercises. Your health care provider may recommend specific exercises to improve your range of motion and strengthen your shoulder. In more severe cases, treatment may include:  Physical  therapy.  Steroid injections.  Surgery. Follow these instructions at home: Managing pain, stiffness, and swelling  If directed, put ice on the injured area. ? If you have a removable sling, remove it as told by your health care provider. ? Put ice in a plastic bag. ? Place a towel between your skin and the bag. ? Leave the ice on for 20 minutes, 2-3 times a day.  Raise (elevate) the injured area above the level of your heart while you are lying down.  Find a comfortable sleeping position or sleep on a recliner, if available.  Move your fingers often to avoid stiffness and to lessen swelling.  Once the swelling has gone down, your health care provider may direct you to apply heat to relax the muscles. Use the heat source that your health care provider recommends, such as a moist heat pack or a heating pad. ? Place a towel between your skin and the heat source. ? Leave the heat on for 20-30 minutes. ? Remove the heat if your skin turns bright red. This is especially important if you are unable to feel pain, heat, or cold. You may have a greater risk of getting burned. If you have a sling:  Wear the sling as told by your health care provider. Remove it only as told by your health care provider.  Loosen the sling if your fingers tingle, become numb, or turn cold and blue.  Keep the sling clean.  If the sling is not waterproof: ? Do not let it get wet. ? Cover it with a watertight covering when you take a bath or a shower. Driving  Do not drive or use heavy machinery while taking prescription pain medicine.  Ask your health care provider when it is safe to drive if you have a sling on your arm. Activity  Rest your shoulder as told by your health care provider.  Return to your normal activities as told by your health care provider. Ask your health care provider what activities are safe for you.  Do any exercises or stretches as told by your health care provider. General  instructions  Do not use any products that contain nicotine or tobacco, such as cigarettes and e-cigarettes. If you need help quitting, ask your health care provider.  Take over-the-counter and prescription medicines only as told by your health care provider.  Keep all follow-up visits as told by your health care provider. This is important. Contact a health care provider if:  Your pain gets worse.  You have new pain in your arm, hands, or fingers.  Medicine does not help your pain. Get help right away if:  Your arm, hand, or fingers are numb or tingling.  Your arm, hand, or fingers are swollen or painful or they turn white or blue.  Your hand or fingers on your injured arm are colder than your other hand. Summary  A rotator cuff tear is a partial or complete tear of the cord-like bands (tendons) that connect muscle to bone in the rotator cuff.  The tear can occur suddenly (acute tear) or can develop over  a long period of time (chronic tear).  Treatment generally includes rest, anti-inflammatory medicines, and icing. In some cases, physical therapy and steroid injections may be needed. In severe cases, surgery may be needed. This information is not intended to replace advice given to you by your health care provider. Make sure you discuss any questions you have with your health care provider. Document Revised: 01/07/2017 Document Reviewed: 04/12/2016 Elsevier Patient Education  [place].

## 2019-02-13 MED FILL — LISINOPRIL-HCTZ 20-12.5 MG: 20-12.5 | 90 days supply | Qty: 90 | Fill #2

## 2019-02-14 ENCOUNTER — Encounter: Payer: Self-pay | Admitting: Family Medicine

## 2019-02-16 MED FILL — DOXYCYCLINE MONO 100 MG TAB: 100 | 14 days supply | Qty: 28 | Fill #0

## 2019-02-23 ENCOUNTER — Other Ambulatory Visit: Payer: Self-pay | Admitting: Family Medicine

## 2019-02-23 NOTE — Telephone Encounter (Signed)
Last OV 10/04/18 Last refill 08/29/18 #90/0 Next OV 03/12/19

## 2019-02-26 MED FILL — ADDERALL XR 20 MG CAP SA: 20 | 90 days supply | Qty: 90 | Fill #0

## 2019-02-27 ENCOUNTER — Encounter: Payer: Self-pay | Admitting: Family Medicine

## 2019-02-27 DIAGNOSIS — M7581 Other shoulder lesions, right shoulder: Secondary | ICD-10-CM

## 2019-03-01 ENCOUNTER — Encounter (HOSPITAL_COMMUNITY): Payer: Self-pay | Admitting: Occupational Therapy

## 2019-03-01 ENCOUNTER — Ambulatory Visit (HOSPITAL_COMMUNITY): Payer: No Typology Code available for payment source | Attending: Family Medicine | Admitting: Occupational Therapy

## 2019-03-01 ENCOUNTER — Other Ambulatory Visit: Payer: Self-pay

## 2019-03-01 DIAGNOSIS — M25511 Pain in right shoulder: Secondary | ICD-10-CM | POA: Diagnosis present

## 2019-03-01 DIAGNOSIS — R29898 Other symptoms and signs involving the musculoskeletal system: Secondary | ICD-10-CM | POA: Diagnosis present

## 2019-03-01 NOTE — Patient Instructions (Signed)
  1) Flexion Wall Stretch    Face wall, place affected handon wall in front of you. Slide hand up the wall  and lean body in towards the wall. Hold for 10 seconds. Repeat 3-5 times. 1-2 times/day.     2) Towel Stretch with Internal Rotation      Gently pull your right arm behind your back with the assist of a towel. Hold 10 seconds, repeat 3-5 times. 1-2 times/day.             3) Corner Stretch    Stand at a corner of a wall, place your arms on the walls with elbows bent. Lean into the corner until a stretch is felt along the front of your chest and/or shoulders. Hold for 10 seconds. Repeat 3-5X, 1-2 times/day.    4) Posterior Capsule Stretch    Bring the involved arm across chest. Grasp elbow and pull toward chest until you feel a stretch in the back of the upper arm and shoulder. Hold 10 seconds. Repeat 3-5X. Complete 1-2 times/day.    5) Scapular Retraction    Tuck chin back as you pinch shoulder blades together.  Hold 5 seconds. Repeat 3-5X. Complete 1-2 times/day.    6) External Rotation Stretch:

## 2019-03-01 NOTE — Therapy (Signed)
[facility] [place], [place], [address] Phone: [phone]   Fax:  [phone]  Occupational Therapy Evaluation  Patient Details  Name: Candice Parker MRN: [id number] Date of Birth: 1970-06-25 Referring Provider (OT): Dr. Garret Reddish   Encounter Date: 03/01/2019  OT End of Session - 03/01/19 1542    Visit Number  1    Number of Visits  8    Date for OT Re-Evaluation  03/31/19    Authorization Type  [facility] FOCUS plan    Authorization Time Period  No visit limit, $40 copay    OT Start Time  1505    OT Stop Time  1538    OT Time Calculation (min)  33 min    Activity Tolerance  Patient tolerated treatment well    Behavior During Therapy  [facility] for tasks assessed/performed       Past Medical History:  Diagnosis Date  . Acid reflux 12/10/2010  . ASTHMA, INTRINSIC 01/29/2009  . Breast lesion 09/29/2011  . Hypertension   . Overweight(278.02) 12/10/2010    Past Surgical History:  Procedure Laterality Date  . ELBOW FRACTURE SURGERY    . INNER EAR SURGERY    . TOTAL ABDOMINAL HYSTERECTOMY     fibroid uterus.    There were no vitals filed for this visit.  Subjective Assessment - 03/01/19 1537    Subjective   S: It's been hurting for a few months.    Pertinent History  Pt is a 49 y/o female presenting with shoulder pain beginning 3-4 months ago with no known cause. Pt had an ultrasound showing bursitis, tendinitis, and possible partial-thickness mid body bursal surface supraspinatus tear. Pt was referred to occupational therapy for evaluation and treatment by Dr. Garret Reddish.    Special Tests  FOTO: 57/100    Patient Stated Goals  To have less pain in my shoulder.    Currently in Pain?  Yes    Pain Score  4     Pain Location  Shoulder    Pain Orientation  Right    Pain Descriptors / Indicators  Aching;Sore    Pain Type  Acute pain    Pain Radiating Towards  N/A    Pain Onset  More than a month ago    Pain Frequency   Intermittent    Aggravating Factors   reaching behind back    Pain Relieving Factors  unsure    Effect of Pain on Daily Activities  min effect on ADLs    Multiple Pain Sites  No        [facility] OT Assessment - 03/01/19 1504      Assessment   Medical Diagnosis  right RC tendinitis    Referring Provider (OT)  Dr. 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 Pt is having difficulty with sleeping      Written Expression   Dominant Hand  Right      Cognition   Overall Cognitive Status  Within Functional Limits for tasks assessed      Observation/Other Assessments   Focus on Therapeutic Outcomes (FOTO)   57/100      ROM / Strength   AROM / PROM / Strength  AROM;PROM;Strength      Palpation   Palpation comment  mod fascial restrictions in right upper arm and anterior shoulder regions      AROM   Overall AROM Comments  Assessed seated, er/IR adducted    AROM Assessment Site  Shoulder    Right/Left Shoulder  Right    Right Shoulder Flexion  165 Degrees    Right Shoulder ABduction  143 Degrees    Right Shoulder Internal Rotation  90 Degrees    Right Shoulder External Rotation  63 Degrees      PROM   Overall PROM Comments  Assessed supine, er/IR adducted    PROM Assessment Site  Shoulder    Right/Left Shoulder  Right    Right Shoulder Flexion  169 Degrees     Right Shoulder ABduction  159 Degrees    Right Shoulder Internal Rotation  90 Degrees    Right Shoulder External Rotation  90 Degrees      Strength   Overall Strength Comments  Assessed seated, er/IR adducted    Strength Assessment Site  Shoulder    Right/Left Shoulder  Right    Right Shoulder Flexion  3+/5    Right Shoulder ABduction  4-/5    Right Shoulder Internal Rotation  4+/5    Right Shoulder External Rotation  4-/5                      OT Education - 03/01/19 1532    Education Details  shoulder stretches    Person(s) Educated  Patient    Methods  Explanation;Demonstration;Handout    Comprehension  Verbalized understanding;Returned demonstration       OT Short Term Goals - 03/01/19 1546      OT SHORT TERM GOAL #1   Title  Pt will be provided with and educated on HEP to improve mobility required for ADL completion using RUE.    Time  4    Period  Weeks    Status  New    Target Date  03/31/19      OT SHORT TERM GOAL #2   Title  Pt will decrease pain in RUE to 3/10 or less to improve ability to sleep in position of comfort for 4 hours or longer.    Time  4    Period  Weeks    Status  New      OT SHORT TERM GOAL #3   Title  Pt will decrease fascial restrictions in RUE to min amounts or less to improve mobility required for functional reaching tasks.    Time  4    Period  Weeks    Status  New      OT SHORT TERM GOAL #4   Title  Pt will improve RUE ROM to WNL to increase ability to reach behind back during dressing tasks.    Time  4    Period  Weeks  Status  New      OT SHORT TERM GOAL #5   Title  Pt will increase RUE strength to 4+/5 to improve ability to lift and carry grocery bags.    Time  4    Period  Weeks    Status  New               Plan - 03/01/19 1543    Clinical Impression Statement  A: Pt is a 49 y/o female presenting with right shoulder pain limiting ability to complete functional tasks during ADLs and work. Pt reports  difficulty sleeping as well. Educated on HEP and pain management for home. Pt with occasional muscle spasm and cramping during abduction, traction successful in resolving.    OT Occupational Profile and History  Problem Focused Assessment - Including review of records relating to presenting problem    Occupational performance deficits (Please refer to evaluation for details):  ADL's;IADL's;Rest and Sleep;Work;Leisure    Body Structure / Function / Physical Skills  ADL;Endurance;UE functional use;Fascial restriction;Pain;ROM;IADL;Strength    Rehab Potential  Good    Clinical Decision Making  Limited treatment options, no task modification necessary    Comorbidities Affecting Occupational Performance:  None    Modification or Assistance to Complete Evaluation   No modification of tasks or assist necessary to complete eval    OT Frequency  2x / week    OT Duration  4 weeks    OT Treatment/Interventions  Self-care/ADL training;Ultrasound;Patient/family education;Passive range of motion;Cryotherapy;Electrical Stimulation;Moist Heat;Therapeutic exercise;Manual Therapy;Therapeutic activities    Plan  P: Pt will benefit from skilled OT services to decrease pain and fascial restrictions, increase joint ROM, strength and functional use of RUE during ADLs. Treatment plan: myofascial release, manual techniques, P/ROM, A/ROM, general RUE strengthening and stability, scapular strengthening and stability, modalities prn    Consulted and Agree with Plan of Care  Patient       Patient will benefit from skilled therapeutic intervention in order to improve the following deficits and impairments:   Body Structure / Function / Physical Skills: ADL, Endurance, UE functional use, Fascial restriction, Pain, ROM, IADL, Strength       Visit Diagnosis: Acute pain of right shoulder - Plan: Ot plan of care cert/re-cert  Other symptoms and signs involving the musculoskeletal system - Plan: Ot plan of care  cert/re-cert    Problem List Patient Active Problem List   Diagnosis Date Noted  . Stress headache 07/18/2017  . Attention deficit disorder (ADD) in adult 06/13/2014  . Left elbow pain 06/13/2014  . Herpes simplex 06/13/2014  . Left knee pain 06/13/2014  . Chest pain, atypical 04/11/2011  . Intermittent memory loss 02/18/2011  . Obesity 12/10/2010  . HTN (hypertension) 07/17/2010  . Asthma 01/29/2009   Guadelupe Sabin, OTR/L  [phone] 03/01/2019, 3:50 PM  [facility] [address] [place], [place], [address] Phone: [phone]   Fax:  [phone]  Name: Candice Parker MRN: [id number] Date of Birth: December 05, 1970

## 2019-03-07 ENCOUNTER — Encounter (HOSPITAL_COMMUNITY): Payer: Self-pay | Admitting: Occupational Therapy

## 2019-03-07 ENCOUNTER — Ambulatory Visit (HOSPITAL_COMMUNITY): Payer: No Typology Code available for payment source | Admitting: Occupational Therapy

## 2019-03-07 ENCOUNTER — Other Ambulatory Visit: Payer: Self-pay

## 2019-03-07 DIAGNOSIS — M25511 Pain in right shoulder: Secondary | ICD-10-CM

## 2019-03-07 DIAGNOSIS — R29898 Other symptoms and signs involving the musculoskeletal system: Secondary | ICD-10-CM

## 2019-03-07 NOTE — Therapy (Signed)
[facility] [place], [place], [address] Phone: [phone]   Fax:  [phone]  Occupational Therapy Treatment  Patient Details  Name: Candice Parker MRN: [id number] Date of Birth: 05-May-1970 Referring Provider (OT): Dr. Garret Reddish   Encounter Date: 03/07/2019  OT End of Session - 03/07/19 1731    Visit Number  2    Number of Visits  8    Date for OT Re-Evaluation  03/31/19    Authorization Type  [facility] FOCUS plan    Authorization Time Period  No visit limit, $40 copay    OT Start Time  1650    OT Stop Time  1730    OT Time Calculation (min)  40 min    Activity Tolerance  Patient tolerated treatment well    Behavior During Therapy  [facility] for tasks assessed/performed       Past Medical History:  Diagnosis Date  . Acid reflux 12/10/2010  . ASTHMA, INTRINSIC 01/29/2009  . Breast lesion 09/29/2011  . Hypertension   . Overweight(278.02) 12/10/2010    Past Surgical History:  Procedure Laterality Date  . ELBOW FRACTURE SURGERY    . INNER EAR SURGERY    . TOTAL ABDOMINAL HYSTERECTOMY     fibroid uterus.    There were no vitals filed for this visit.  Subjective Assessment - 03/07/19 1649    Subjective   S: It was sore this morning but it's feeling better now.    Currently in Pain?  No/denies         [facility] OT Assessment - 03/07/19 1648      Assessment   Medical Diagnosis  right RC tendinitis      Precautions   Precautions  None               OT Treatments/Exercises (OP) - 03/07/19 1652      Exercises   Exercises  Shoulder      Shoulder Exercises: Supine   Protraction  PROM;5 reps;AROM;10 reps    Horizontal ABduction  PROM;5 reps;AROM;10 reps    External Rotation  PROM;5 reps;AROM;10 reps    Internal Rotation  PROM;5 reps;AROM;10 reps    Flexion  PROM;5 reps;AROM;10 reps    ABduction  PROM;5 reps;AROM;10 reps      Shoulder Exercises: Standing   Extension  Theraband;10 reps    Theraband  Level (Shoulder Extension)  Level 2 (Red)    Row  Theraband;10 reps    Theraband Level (Shoulder Row)  Level 2 (Red)    Retraction  Theraband;10 reps    Theraband Level (Shoulder Retraction)  Level 2 (Red)      Shoulder Exercises: Pulleys   Flexion  1 minute    ABduction  1 minute      Shoulder Exercises: ROM/Strengthening   UBE (Upper Arm Bike)  Level 1 2' forward 2' reverse    Proximal Shoulder Strengthening, Supine  10X each, no rest breaks      Manual Therapy   Manual Therapy  Myofascial release    Manual therapy comments  completed separately from therapeutic exercises    Myofascial Release  myofascial release and manual techniques to right upper arm, trapezius, and scapular regions to decrease pain and fascial restrictions and increase joint ROM               OT Short Term Goals - 03/01/19 1546      OT SHORT TERM GOAL #1   Title  Pt will be  provided with and educated on HEP to improve mobility required for ADL completion using RUE.    Time  4    Period  Weeks    Status  New    Target Date  03/31/19      OT SHORT TERM GOAL #2   Title  Pt will decrease pain in RUE to 3/10 or less to improve ability to sleep in position of comfort for 4 hours or longer.    Time  4    Period  Weeks    Status  New      OT SHORT TERM GOAL #3   Title  Pt will decrease fascial restrictions in RUE to min amounts or less to improve mobility required for functional reaching tasks.    Time  4    Period  Weeks    Status  New      OT SHORT TERM GOAL #4   Title  Pt will improve RUE ROM to WNL to increase ability to reach behind back during dressing tasks.    Time  4    Period  Weeks    Status  New      OT SHORT TERM GOAL #5   Title  Pt will increase RUE strength to 4+/5 to improve ability to lift and carry grocery bags.    Time  4    Period  Weeks    Status  New               Plan - 03/07/19 1731    Clinical Impression Statement  A: Pt reports mild to moderate pain over  the weekend, none since this morning. Initiated myofascial release, passive stretching, A/ROM, and scapular theraband exercises today. Pt with P/ROM WNL during stretching, occasionally with a catching sensation during active abduction. Verbal cuing for form and technique.    Body Structure / Function / Physical Skills  ADL;Endurance;UE functional use;Fascial restriction;Pain;ROM;IADL;Strength    Plan  P: continue with A/ROM and add in standing, update HEP for A/ROM       Patient will benefit from skilled therapeutic intervention in order to improve the following deficits and impairments:   Body Structure / Function / Physical Skills: ADL, Endurance, UE functional use, Fascial restriction, Pain, ROM, IADL, Strength       Visit Diagnosis: Acute pain of right shoulder  Other symptoms and signs involving the musculoskeletal system    Problem List Patient Active Problem List   Diagnosis Date Noted  . Stress headache 07/18/2017  . Attention deficit disorder (ADD) in adult 06/13/2014  . Left elbow pain 06/13/2014  . Herpes simplex 06/13/2014  . Left knee pain 06/13/2014  . Chest pain, atypical 04/11/2011  . Intermittent memory loss 02/18/2011  . Obesity 12/10/2010  . HTN (hypertension) 07/17/2010  . Asthma 01/29/2009   Guadelupe Sabin, OTR/L  [phone] 03/07/2019, 5:35 PM  [facility] [place], [place], [address] Phone: [phone]   Fax:  [phone]  Name: KARROL PEROT MRN: [id number] Date of Birth: 1970/03/07

## 2019-03-12 ENCOUNTER — Encounter: Payer: Self-pay | Admitting: Family Medicine

## 2019-03-12 ENCOUNTER — Ambulatory Visit (INDEPENDENT_AMBULATORY_CARE_PROVIDER_SITE_OTHER): Payer: No Typology Code available for payment source | Admitting: Family Medicine

## 2019-03-12 VITALS — BP 136/90 | HR 75 | Temp 97.4°F | Ht 67.5 in | Wt 230.0 lb

## 2019-03-12 DIAGNOSIS — I10 Essential (primary) hypertension: Secondary | ICD-10-CM

## 2019-03-12 DIAGNOSIS — J4520 Mild intermittent asthma, uncomplicated: Secondary | ICD-10-CM

## 2019-03-12 DIAGNOSIS — F988 Other specified behavioral and emotional disorders with onset usually occurring in childhood and adolescence: Secondary | ICD-10-CM | POA: Diagnosis not present

## 2019-03-12 DIAGNOSIS — B009 Herpesviral infection, unspecified: Secondary | ICD-10-CM | POA: Diagnosis not present

## 2019-03-12 NOTE — Progress Notes (Signed)
Phone [phone] Virtual visit via Video note   Subjective:  Chief complaint: Chief Complaint  Patient presents with  . Medication Refill   This visit type was conducted due to national recommendations for restrictions regarding the COVID-19 Pandemic (e.g. social distancing).  This format is felt to be most appropriate for this patient at this time balancing risks to patient and risks to population by having him in for in person visit.  No physical exam was performed (except for noted visual exam or audio findings with Telehealth visits).    Our team/I connected with Candice Parker at  8:00 AM EST by a video enabled telemedicine application (doxy.me or caregility through epic) and verified that I am speaking with the correct person using two identifiers.  Location patient: Home[place] Location provider: [facility], office Persons participating in the virtual visit:  patient  Our team/I discussed the limitations of evaluation and management by telemedicine and the availability of in person appointments. In light of current covid-19 pandemic, patient also understands that we are trying to protect them by minimizing in office contact if at all possible.  The patient expressed consent for telemedicine visit and agreed to proceed. Patient understands insurance will be billed.   Past Medical History-  Patient Active Problem List   Diagnosis Date Noted  . Attention deficit disorder (ADD) in adult 06/13/2014    Priority: High  . Left knee pain 06/13/2014    Priority: Medium  . HTN (hypertension) 07/17/2010    Priority: Medium  . Stress headache 07/18/2017    Priority: Low  . Left elbow pain 06/13/2014    Priority: Low  . Herpes simplex 06/13/2014    Priority: Low  . Chest pain, atypical 04/11/2011    Priority: Low  . Intermittent memory loss 02/18/2011    Priority: Low  . Obesity 12/10/2010    Priority: Low  . Asthma 01/29/2009    Priority: Low    Medications- reviewed and  updated Current Outpatient Medications  Medication Sig Dispense Refill  . acyclovir (ZOVIRAX) 400 MG tablet Take 1 tablet (400 mg total) by mouth 5 (five) times daily. For 5 days- take at first sign of cold sore 30 tablet 2  . Acyclovir-Hydrocortisone 5-1 % CREA Apply 5 times/day for 5 days 5 g 2  . ADDERALL XR 20 MG 24 hr capsule TAKE 1 CAPSULE (20 MG TOTAL) BY MOUTH EVERY MORNING. 90 capsule 0  . albuterol (PROVENTIL HFA;VENTOLIN HFA) 108 (90 Base) MCG/ACT inhaler Inhale 1-2 puffs into the lungs every 6 (six) hours as needed for wheezing or shortness of breath. 18 g 5  . ibuprofen (ADVIL,MOTRIN) 200 MG tablet Take 400 mg by mouth every 6 (six) hours as needed.    Marland Kitchen lisinopril-hydrochlorothiazide (PRINZIDE,ZESTORETIC) 20-12.5 MG tablet Take 1 tablet by mouth daily. 90 tablet 3  . Multiple Vitamin (MULTIVITAMIN) tablet Take 1 tablet by mouth daily. Women's One-A-Day    . nitroGLYCERIN (NITRODUR - DOSED IN MG/24 HR) 0.2 mg/hr patch Apply 1/4 patch daily to right shoulder tendon for tendonitis. (Patient not taking: Reported on 03/12/2019) 30 patch 1   No current facility-administered medications for this visit.     Objective:  Temp (!) 97.4 F (36.3 C) (Oral)   Ht 5' 7.5" (1.715 m)   Wt 230 lb (104.3 kg)   LMP  (LMP Unknown)   BMI 35.49 kg/m  self reported vitals Gen: NAD, resting comfortably Lungs: nonlabored, normal respiratory rate  Skin: appears dry, no obvious rash  Assessment and Plan  # Attention Deficit Disorder in Adult S:compliant with Adderall XR 20 mg daily.  Controlling symptoms reasonably well - helps her manage both school at [employer] and work - mainly takes during the week. Skips on weekends - controlled substance contract 07/18/17  -UDS 07/18/17 - we discussed updating this - original diagnosis by Dr. Cheryln Parker of [facility] Since the last visit has the patient had any:  Appetite changes? No Unintentional weight loss? No Is medication working well ?  Yes Does patient take drug holidays? Does not take on weekends  Difficulties falling to sleep or maintaining sleep? Having increased issue with sleep. She feels like this is stress related with heavy workload Any anxiety?  No Any cardiac issues (fainting or paliptations)? No Suicidal thoughts? No Changes in health since last visit? No New medications? Vitamin D added  Any illicit substance abuse? No Has the patient taken his medication today? Yes A/P: Good control - continue adderrall- just refilled on 02/24/2019 for 3 months - she can call in again in 3 months for another refill - sleep issues with stress in week- some sleep issues- she is working on cutting down work responsibilities to see if that will help - CPE in 6 months- we will update uds at that time  #hypertension S: compliant with Lisinopril-HTCZ 20-12.5 mg. Does check readings at work on average have been in normal range. She will check when she gets to work to day and send [place] a my chart to let us know what it is.  A/P: hopefully controlled- continue current rx for now  # Asthma S:Using Albuterol HFA prn.  No recent issues. She has only used inhaler once in the last three months.  A/P: Stable. Continue current medications.   # Herpes Simplex- Using  Acyclovir or Acyclovir-Hydrocortisone cream prn for cold sores. No recent breakouts. Only outbreaks have been on her nose. Dating someone since may 2020 and considering marriage. - went to middle school and HS together  # Right shoulder pain- symptoms improving. working with PT and following up with Dr. Georgina Parker. Back is much better from last visit   # Bacterial vaginosis-  issues cleared with medication  Recommended follow up: 6 months for physical Future Appointments  Date Time Provider [place]  03/13/2019  1:00 PM Troxler, Lujean Amel, OT [facility] None  03/15/2019  8:15 AM Candice Hams, MD [facility] None  03/15/2019 10:00 AM [facility] LAB [facility] PEC  03/15/2019  3:15 PM  Troxler, Lujean Amel, OT [facility] None   Lab/Order associations:   ICD-10-CM   1. Attention deficit disorder (ADD) in adult  F98.8   2. Essential hypertension  I10   3. Mild intermittent asthma without complication  A999333   4. Herpes simplex  B00.9    Return precautions advised.  Garret Reddish, MD

## 2019-03-13 ENCOUNTER — Encounter (HOSPITAL_COMMUNITY): Payer: Self-pay | Admitting: Occupational Therapy

## 2019-03-13 ENCOUNTER — Other Ambulatory Visit: Payer: Self-pay

## 2019-03-13 ENCOUNTER — Ambulatory Visit (HOSPITAL_COMMUNITY): Payer: No Typology Code available for payment source | Attending: Family Medicine | Admitting: Occupational Therapy

## 2019-03-13 DIAGNOSIS — R29898 Other symptoms and signs involving the musculoskeletal system: Secondary | ICD-10-CM | POA: Diagnosis present

## 2019-03-13 DIAGNOSIS — M25511 Pain in right shoulder: Secondary | ICD-10-CM | POA: Diagnosis present

## 2019-03-13 NOTE — Patient Instructions (Signed)

## 2019-03-13 NOTE — Therapy (Signed)
[place], [place], [address] Phone: [phone]   Fax:  [phone]  Occupational Therapy Treatment  Patient Details  Name: Candice Parker MRN: [id number] Date of Birth: [birth year] Referring Provider (OT): Dr. Garret Reddish   Encounter Date: 03/13/2019  OT End of Session - 03/13/19 1343    Visit Number  3    Number of Visits  8    Date for OT Re-Evaluation  03/31/19    Authorization Type  [employer] FOCUS plan    Authorization Time Period  No visit limit, $40 copay    OT Start Time  1259    OT Stop Time  1342    OT Time Calculation (min)  43 min    Activity Tolerance  Patient tolerated treatment well    Behavior During Therapy  [facility] for tasks assessed/performed       Past Medical History:  Diagnosis Date  . Acid reflux 12/10/2010  . ASTHMA, INTRINSIC 01/29/2009  . Breast lesion 09/29/2011  . Hypertension   . Overweight(278.02) 12/10/2010    Past Surgical History:  Procedure Laterality Date  . ELBOW FRACTURE SURGERY    . INNER EAR SURGERY    . TOTAL ABDOMINAL HYSTERECTOMY     fibroid uterus.    There were no vitals filed for this visit.  Subjective Assessment - 03/13/19 1255    Subjective   S: They took my blood pressure yesterday and I had shooting pains the rest of the day.    Currently in Pain?  No/denies         [facility] OT Assessment - 03/13/19 1254      Assessment   Medical Diagnosis  right RC tendinitis      Precautions   Precautions  None               OT Treatments/Exercises (OP) - 03/13/19 1302      Exercises   Exercises  Shoulder      Shoulder Exercises: Supine   Protraction  PROM;5 reps;AROM;10 reps    Horizontal ABduction  PROM;5 reps;AROM;10 reps    External Rotation  PROM;5 reps;AROM;10 reps    Internal Rotation  PROM;5 reps;AROM;10 reps    Flexion  PROM;5 reps;AROM;10 reps    ABduction  PROM;5 reps;AROM;10 reps      Shoulder Exercises: Standing   Protraction   AROM;10 reps    Horizontal ABduction  AROM;10 reps    External Rotation  AROM;10 reps    Internal Rotation  AROM;10 reps    Flexion  AROM;10 reps    ABduction  AROM;10 reps    Extension  Theraband;10 reps    Theraband Level (Shoulder Extension)  Level 2 (Red)    Row  Theraband;10 reps    Theraband Level (Shoulder Row)  Level 2 (Red)    Retraction  Theraband;10 reps    Theraband Level (Shoulder Retraction)  Level 2 (Red)      Shoulder Exercises: ROM/Strengthening   UBE (Upper Arm Bike)  Level 1 3' forward 3' reverse   pace: 5.0   Anterior Glide  3X10"    Proximal Shoulder Strengthening, Supine  10X each, no rest breaks    Other ROM/Strengthening Exercises  ball pass with tennis ball working on IR, 10X      Shoulder Exercises: Stretch   Wall Stretch - Flexion  3 reps;10 seconds    Wall Stretch - ABduction  3 reps;10 seconds    Other Shoulder Stretches  doorway  stretch, 10", 3X      Manual Therapy   Manual Therapy  Myofascial release    Manual therapy comments  completed separately from therapeutic exercises    Myofascial Release  myofascial release and manual techniques to right upper arm, trapezius, and scapular regions to decrease pain and fascial restrictions and increase joint ROM             OT Education - 03/13/19 1322    Education Details  A/ROM exercises    Person(s) Educated  Patient    Methods  Explanation;Demonstration;Handout    Comprehension  Verbalized understanding;Returned demonstration       OT Short Term Goals - 03/01/19 1546      OT SHORT TERM GOAL #1   Title  Pt will be provided with and educated on HEP to improve mobility required for ADL completion using RUE.    Time  4    Period  Weeks    Status  New    Target Date  03/31/19      OT SHORT TERM GOAL #2   Title  Pt will decrease pain in RUE to 3/10 or less to improve ability to sleep in position of comfort for 4 hours or longer.    Time  4    Period  Weeks    Status  New      OT SHORT  TERM GOAL #3   Title  Pt will decrease fascial restrictions in RUE to min amounts or less to improve mobility required for functional reaching tasks.    Time  4    Period  Weeks    Status  New      OT SHORT TERM GOAL #4   Title  Pt will improve RUE ROM to WNL to increase ability to reach behind back during dressing tasks.    Time  4    Period  Weeks    Status  New      OT SHORT TERM GOAL #5   Title  Pt will increase RUE strength to 4+/5 to improve ability to lift and carry grocery bags.    Time  4    Period  Weeks    Status  New               Plan - 03/13/19 1343    Clinical Impression Statement  A: Pt reports she had a doctor's appt yesterday and they had to take her blood pressure twice and she had shooting pains the rest of the day. Continued with manual techniques today, improvement in fascial restrictions in anterior shoulder noted today. Added AA/ROM in standing this session and completed shoulder stretches. Continued with red theraband exercises and UBE. Pt occasionally with tingling down forearm. Verbal cuing for form and technique.    Body Structure / Function / Physical Skills  ADL;Endurance;UE functional use;Fascial restriction;Pain;ROM;IADL;Strength    Plan  P: Follow up on HEP, add x to v arms and attempt ball on wall in flexion       Patient will benefit from skilled therapeutic intervention in order to improve the following deficits and impairments:   Body Structure / Function / Physical Skills: ADL, Endurance, UE functional use, Fascial restriction, Pain, ROM, IADL, Strength       Visit Diagnosis: Acute pain of right shoulder  Other symptoms and signs involving the musculoskeletal system    Problem List Patient Active Problem List   Diagnosis Date Noted  . Stress headache 07/18/2017  . Attention deficit  disorder (ADD) in adult 06/13/2014  . Left elbow pain 06/13/2014  . Herpes simplex 06/13/2014  . Left knee pain 06/13/2014  . Chest pain,  atypical 04/11/2011  . Intermittent memory loss 02/18/2011  . Obesity 12/10/2010  . HTN (hypertension) 07/17/2010  . Asthma 01/29/2009   Guadelupe Sabin, OTR/L  [phone] 03/13/2019, 2:54 PM  [facility] [address] [place], [place], [address] Phone: [phone]   Fax:  [phone]  Name: Candice Parker MRN: [id number] Date of Birth: Apr 19, 1970

## 2019-03-15 ENCOUNTER — Other Ambulatory Visit: Payer: Self-pay

## 2019-03-15 ENCOUNTER — Ambulatory Visit (INDEPENDENT_AMBULATORY_CARE_PROVIDER_SITE_OTHER): Payer: No Typology Code available for payment source | Admitting: Family Medicine

## 2019-03-15 ENCOUNTER — Encounter: Payer: Self-pay | Admitting: Family Medicine

## 2019-03-15 ENCOUNTER — Telehealth (HOSPITAL_COMMUNITY): Payer: Self-pay | Admitting: Occupational Therapy

## 2019-03-15 ENCOUNTER — Other Ambulatory Visit: Payer: No Typology Code available for payment source

## 2019-03-15 ENCOUNTER — Ambulatory Visit (HOSPITAL_COMMUNITY): Payer: No Typology Code available for payment source | Admitting: Occupational Therapy

## 2019-03-15 VITALS — BP 130/88 | HR 93 | Ht 67.5 in | Wt 234.0 lb

## 2019-03-15 DIAGNOSIS — M7581 Other shoulder lesions, right shoulder: Secondary | ICD-10-CM | POA: Diagnosis not present

## 2019-03-15 DIAGNOSIS — L731 Pseudofolliculitis barbae: Secondary | ICD-10-CM

## 2019-03-15 MED ORDER — DOXYCYCLINE HYCLATE 100 MG PO TABS: 100.0000 mg | ORAL_TABLET | Freq: Two times a day (BID) | ORAL | 0 refills | Status: AC

## 2019-03-15 MED FILL — DOXYCYCLINE HYCLATE 100 MG: 100 | 7 days supply | Qty: 14 | Fill #0

## 2019-03-15 NOTE — Progress Notes (Signed)
   I, Wendy Poet, LAT, ATC, am serving as scribe for Dr. Lynne Leader.  Candice Parker is a 49 y.o. female who presents to [place] at [facility] today for f/u of R anterior shoulder pain.  She was last seen by Dr. Georgina Snell on 02/12/19 and was referred to outpatient PT and placed on nitroglycerin patches.  Since her last visit, pt reports approximately 70% improvement but notes that she still has difficulty w/ picking up heavier items.  She denies any difficulty w/ specific L shoulder ROM.  She states that she has developed cysts under her R armpit when using the nitroglycerin patches and isn't sure it that is the reason for the cysts but feels it could be connected.   She was treated in urgent care for ingrown hair or abscess with oral doxycycline.  This worked well and she was able to get the cyst to express with warm compress.  It is feeling a lot better now.    Pertinent review of systems: No fevers or chills  Relevant historical information: No history frequent abscess   Exam:  BP 130/88 (BP Location: Left Arm, Patient Position: Sitting, Cuff Size: Large)   Pulse 93   Ht 5' 7.5" (1.715 m)   Wt 234 lb (106.1 kg)   LMP  (LMP Unknown)   SpO2 97%   BMI 36.11 kg/m  General: Well Developed, well nourished, and in no acute distress.   MSK:  Right shoulder normal-appearing normal motion normal strength.  Skin right axilla: Small tender erythematous nodule.  No fluctuance.      Assessment and Plan: 49 y.o. female with  Right shoulder pain: Significant improvement with physical therapy.  Plan to continue physical therapy and home exercise program.  If not better next step would be injection versus MRI.  Discussed plan with patient expresses understanding and agreement.  Watchful waiting.  Ingrown hair right axilla: Currently not abscess.  Recommend avoiding shaving in this area for a few weeks with a razor.  Okay to use clippers.  Also recommend chlorhexidine body  wash.  Prescribe doxycycline for use of it starts becoming more infected.  Recommend return to clinic (PCP) if needed for incision and drainage if worsening.  Meds ordered this encounter  Medications  . doxycycline (VIBRA-TABS) 100 MG tablet    Sig: Take 1 tablet (100 mg total) by mouth 2 (two) times daily for 7 days.    Dispense:  14 tablet    Refill:  0     Discussed warning signs or symptoms. Please see discharge instructions. Patient expresses understanding.   The above documentation has been reviewed and is accurate and complete Lynne Leader

## 2019-03-15 NOTE — Telephone Encounter (Signed)
pt called to cancel today's appt due to she has a meeting to attend at work

## 2019-03-15 NOTE — Patient Instructions (Signed)
Thank you for coming in today. Continue home exercises and PT.  Use the nitorpatches as needed if they help.  I am not sure if they are causing your pain.   I think you are trying to have an abscess in your arm pit.  Continue current treatment.  If it starts coming back restart the doxycycline.  Avoid shaving with a razor for a while. Clippers are better.  Also use chlorhexidine body wash for 1-2 weeks.   Let me know if not doing well for shoulder.  Next step is injection or MRI.

## 2019-03-21 ENCOUNTER — Encounter (HOSPITAL_COMMUNITY): Payer: Self-pay

## 2019-03-21 ENCOUNTER — Ambulatory Visit (HOSPITAL_COMMUNITY): Payer: No Typology Code available for payment source

## 2019-03-21 ENCOUNTER — Other Ambulatory Visit: Payer: Self-pay

## 2019-03-21 DIAGNOSIS — M25511 Pain in right shoulder: Secondary | ICD-10-CM

## 2019-03-21 DIAGNOSIS — R29898 Other symptoms and signs involving the musculoskeletal system: Secondary | ICD-10-CM

## 2019-03-21 NOTE — Therapy (Signed)
[place], [place], [address] Phone: [phone]   Fax:  [phone]  Occupational Therapy Treatment  Patient Details  Name: Candice Parker MRN: [id number] Date of Birth: [birth year] Referring Provider (OT): Dr. Garret Reddish   Encounter Date: 03/21/2019  OT End of Session - 03/21/19 0849    Visit Number  4    Number of Visits  8    Date for OT Re-Evaluation  03/31/19    Authorization Type  [employer] FOCUS plan    Authorization Time Period  No visit limit, $40 copay    OT Start Time  0815    OT Stop Time  0853    OT Time Calculation (min)  38 min    Activity Tolerance  Patient tolerated treatment well    Behavior During Therapy  [facility] for tasks assessed/performed       Past Medical History:  Diagnosis Date  . Acid reflux 12/10/2010  . ASTHMA, INTRINSIC 01/29/2009  . Breast lesion 09/29/2011  . Hypertension   . Overweight(278.02) 12/10/2010    Past Surgical History:  Procedure Laterality Date  . ELBOW FRACTURE SURGERY    . INNER EAR SURGERY    . TOTAL ABDOMINAL HYSTERECTOMY     fibroid uterus.    There were no vitals filed for this visit.  Subjective Assessment - 03/21/19 0828    Subjective   S: Last night it was hurting me. It was around a 3. Right now it feels ok.    Currently in Pain?  No/denies         [facility] OT Assessment - 03/21/19 0828      Assessment   Medical Diagnosis  right RC tendinitis      Precautions   Precautions  None               OT Treatments/Exercises (OP) - 03/21/19 0828      Exercises   Exercises  Shoulder      Shoulder Exercises: Supine   Protraction  PROM;5 reps    Horizontal ABduction  PROM;5 reps;Strengthening;10 reps    Horizontal ABduction Weight (lbs)  1    External Rotation  PROM;5 reps;Strengthening;10 reps    External Rotation Weight (lbs)  1    Internal Rotation  PROM;5 reps;Strengthening;10 reps    Internal Rotation Weight (lbs)  1    Flexion   PROM;5 reps;Strengthening;10 reps    Shoulder Flexion Weight (lbs)  1    ABduction  PROM;5 reps;Strengthening;10 reps    Shoulder ABduction Weight (lbs)  1      Shoulder Exercises: Standing   Protraction  Strengthening;10 reps    Protraction Weight (lbs)  1    Horizontal ABduction  Strengthening;10 reps    Horizontal ABduction Weight (lbs)  1    External Rotation  Strengthening;10 reps    External Rotation Weight (lbs)  1    Internal Rotation  Strengthening;10 reps    Internal Rotation Weight (lbs)  1    Flexion  Strengthening;10 reps    Shoulder Flexion Weight (lbs)  1    ABduction  AROM;12 reps    Extension  Theraband;10 reps    Theraband Level (Shoulder Extension)  Level 2 (Red)    Row  Theraband;10 reps    Theraband Level (Shoulder Row)  Level 2 (Red)    Retraction  Theraband;10 reps    Theraband Level (Shoulder Retraction)  Level 2 (Red)      Shoulder Exercises: ROM/Strengthening  UBE (Upper Arm Bike)  Level 2 forward 2' reverse 2'    pace: 6.0-8.0   X to V Arms  10X A/ROM    Proximal Shoulder Strengthening, Supine  10X with 1# no rest breaks    Proximal Shoulder Strengthening, Seated  10X A/ROM no rest breaks    Ball on Wall  1' flexion green ball      Manual Therapy   Manual Therapy  Myofascial release    Manual therapy comments  completed separately from therapeutic exercises    Myofascial Release  myofascial release and manual techniques to right upper arm, trapezius, and scapular regions to decrease pain and fascial restrictions and increase joint ROM               OT Short Term Goals - 03/21/19 [id number]      OT SHORT TERM GOAL #1   Title  Pt will be provided with and educated on HEP to improve mobility required for ADL completion using RUE.    Time  4    Period  Weeks    Status  On-going    Target Date  03/31/19      OT SHORT TERM GOAL #2   Title  Pt will decrease pain in RUE to 3/10 or less to improve ability to sleep in position of comfort for 4  hours or longer.    Time  4    Period  Weeks    Status  On-going      OT SHORT TERM GOAL #3   Title  Pt will decrease fascial restrictions in RUE to min amounts or less to improve mobility required for functional reaching tasks.    Time  4    Period  Weeks    Status  On-going      OT SHORT TERM GOAL #4   Title  Pt will improve RUE ROM to WNL to increase ability to reach behind back during dressing tasks.    Time  4    Period  Weeks    Status  On-going      OT SHORT TERM GOAL #5   Title  Pt will increase RUE strength to 4+/5 to improve ability to lift and carry grocery bags.    Time  4    Period  Weeks    Status  On-going               Plan - 03/21/19 1037    Clinical Impression Statement  A: Progressed to 1# hand weights supine to begin loading the tendons of the shoulder/RTC. Patient reports mild discomfort although able to complete.VC for form and technique were provided. Manual techniques completed to address fascial restrictions in the right UE.    Body Structure / Function / Physical Skills  ADL;Endurance;UE functional use;Fascial restriction;Pain;ROM;IADL;Strength    Plan  P: Continue with low weight strengthening supine and progress to standing when able to tolerate. Add ball on the wall in abduction.    Consulted and Agree with Plan of Care  Patient       Patient will benefit from skilled therapeutic intervention in order to improve the following deficits and impairments:   Body Structure / Function / Physical Skills: ADL, Endurance, UE functional use, Fascial restriction, Pain, ROM, IADL, Strength       Visit Diagnosis: Acute pain of right shoulder  Other symptoms and signs involving the musculoskeletal system    Problem List Patient Active Problem List   Diagnosis Date Noted  .  Stress headache 07/18/2017  . Attention deficit disorder (ADD) in adult 06/13/2014  . Left elbow pain 06/13/2014  . Herpes simplex 06/13/2014  . Left knee pain 06/13/2014   . Chest pain, atypical 04/11/2011  . Intermittent memory loss 02/18/2011  . Obesity 12/10/2010  . HTN (hypertension) 07/17/2010  . Asthma 01/29/2009   Ailene Ravel, OTR/L,CBIS  [phone]  03/21/2019, 10:42 AM  [facility] [address] [place], [place], [address] Phone: [phone]   Fax:  [phone]  Name: Candice Parker MRN: [id number] Date of Birth: 07-Dec-1970

## 2019-04-04 ENCOUNTER — Encounter (HOSPITAL_COMMUNITY): Payer: Self-pay | Admitting: Occupational Therapy

## 2019-04-04 ENCOUNTER — Other Ambulatory Visit: Payer: Self-pay

## 2019-04-04 ENCOUNTER — Ambulatory Visit (HOSPITAL_COMMUNITY): Payer: No Typology Code available for payment source | Admitting: Occupational Therapy

## 2019-04-04 DIAGNOSIS — R29898 Other symptoms and signs involving the musculoskeletal system: Secondary | ICD-10-CM

## 2019-04-04 DIAGNOSIS — M25511 Pain in right shoulder: Secondary | ICD-10-CM

## 2019-04-04 NOTE — Therapy (Signed)
[place], [place], [address] Phone: [phone]   Fax:  [phone]  Occupational Therapy Reassessment, Treatment, Discharge Summary  Patient Details  Name: Candice Parker MRN: [id number] Date of Birth: 1971-01-29 Referring Provider (OT): Dr. Garret Reddish   Encounter Date: 04/04/2019  OT End of Session - 04/04/19 0930    Visit Number  5    Number of Visits  8    Date for OT Re-Evaluation  03/31/19    Authorization Type  [facility] FOCUS plan    Authorization Time Period  No visit limit, $40 copay    OT Start Time  0854    OT Stop Time  0929    OT Time Calculation (min)  35 min    Activity Tolerance  Patient tolerated treatment well    Behavior During Therapy  [facility] for tasks assessed/performed       Past Medical History:  Diagnosis Date  . Acid reflux 12/10/2010  . ASTHMA, INTRINSIC 01/29/2009  . Breast lesion 09/29/2011  . Hypertension   . Overweight(278.02) 12/10/2010    Past Surgical History:  Procedure Laterality Date  . ELBOW FRACTURE SURGERY    . INNER EAR SURGERY    . TOTAL ABDOMINAL HYSTERECTOMY     fibroid uterus.    There were no vitals filed for this visit.  Subjective Assessment - 04/04/19 0849    Subjective   S:I'm able to sleep on the right side now without pain.    Currently in Pain?  No/denies         [facility] OT Assessment - 04/04/19 0849      Assessment   Medical Diagnosis  right RC tendinitis      Precautions   Precautions  None      Observation/Other Assessments   Focus on Therapeutic Outcomes (FOTO)   74/100   57/100 previous     Palpation   Palpation comment  trace fascial restrictions in trapezius      AROM   Overall AROM Comments  Assessed seated, er/IR adducted    AROM Assessment Site  Shoulder    Right/Left Shoulder  Right    Right Shoulder Flexion  178 Degrees   165 previous   Right Shoulder ABduction  180 Degrees   143 previous   Right Shoulder Internal  Rotation  90 Degrees   same as previous   Right Shoulder External Rotation  90 Degrees   63 previous     PROM   Overall PROM Comments  Assessed supine, er/IR adducted    PROM Assessment Site  Shoulder    Right/Left Shoulder  Right    Right Shoulder Flexion  180 Degrees   169 previous   Right Shoulder ABduction  180 Degrees   159 previous   Right Shoulder Internal Rotation  90 Degrees   same as previous   Right Shoulder External Rotation  90 Degrees   same as previous     Strength   Overall Strength Comments  Assessed seated, er/IR adducted    Strength Assessment Site  Shoulder    Right/Left Shoulder  Right    Right Shoulder Flexion  5/5   3+/5 previous   Right Shoulder ABduction  5/5   4-/5 previous   Right Shoulder Internal Rotation  5/5   4+/5 previous   Right Shoulder External Rotation  5/5   4-/5 previous              OT Treatments/Exercises (OP) -  04/04/19 0909      Exercises   Exercises  Shoulder      Shoulder Exercises: Supine   Protraction  PROM;5 reps    Horizontal ABduction  PROM;5 reps;Strengthening;10 reps    Horizontal ABduction Weight (lbs)  1    External Rotation  PROM;5 reps;Strengthening;10 reps    External Rotation Weight (lbs)  1    Internal Rotation  PROM;5 reps;Strengthening;10 reps    Internal Rotation Weight (lbs)  1    Flexion  PROM;5 reps;Strengthening;10 reps    Shoulder Flexion Weight (lbs)  1    ABduction  PROM;5 reps;Strengthening;10 reps    Shoulder ABduction Weight (lbs)  1      Shoulder Exercises: Standing   Protraction  Theraband;10 reps    Theraband Level (Shoulder Protraction)  Level 2 (Red)    Horizontal ABduction  Theraband;10 reps    Theraband Level (Shoulder Horizontal ABduction)  Level 2 (Red)    External Rotation  Theraband;10 reps    Theraband Level (Shoulder External Rotation)  Level 2 (Red)    Internal Rotation  Theraband;10 reps    Theraband Level (Shoulder Internal Rotation)  Level 2 (Red)    Flexion   Theraband;10 reps    Theraband Level (Shoulder Flexion)  Level 2 (Red)    ABduction  Theraband;10 reps    Theraband Level (Shoulder ABduction)  Level 2 (Red)    Extension  Theraband;10 reps    Theraband Level (Shoulder Extension)  Level 2 (Red)    Row  Theraband;10 reps    Theraband Level (Shoulder Row)  Level 2 (Red)    Retraction  Theraband;10 reps    Theraband Level (Shoulder Retraction)  Level 2 (Red)      Shoulder Exercises: ROM/Strengthening   Proximal Shoulder Strengthening, Supine  10X with 1# no rest breaks      Manual Therapy   Manual Therapy  Myofascial release    Manual therapy comments  completed separately from therapeutic exercises    Myofascial Release  myofascial release and manual techniques to right upper arm, trapezius, and scapular regions to decrease pain and fascial restrictions and increase joint ROM             OT Education - 04/04/19 0914    Education Details  red theraband shoulder strengthening and scapular strengthening, educated on using a low weight 1# (soup can or water bottle) for strengthening as well    Person(s) Educated  Patient    Methods  Explanation;Demonstration;Handout    Comprehension  Verbalized understanding;Returned demonstration       OT Short Term Goals - 04/04/19 0911      OT SHORT TERM GOAL #1   Title  Pt will be provided with and educated on HEP to improve mobility required for ADL completion using RUE.    Time  4    Period  Weeks    Status  Achieved    Target Date  03/31/19      OT SHORT TERM GOAL #2   Title  Pt will decrease pain in RUE to 3/10 or less to improve ability to sleep in position of comfort for 4 hours or longer.    Time  4    Period  Weeks    Status  Achieved      OT SHORT TERM GOAL #3   Title  Pt will decrease fascial restrictions in RUE to min amounts or less to improve mobility required for functional reaching tasks.    Time  4  Period  Weeks    Status  Achieved      OT SHORT TERM GOAL #4    Title  Pt will improve RUE ROM to WNL to increase ability to reach behind back during dressing tasks.    Time  4    Period  Weeks    Status  Achieved      OT SHORT TERM GOAL #5   Title  Pt will increase RUE strength to 4+/5 to improve ability to lift and carry grocery bags.    Time  4    Period  Weeks    Status  Achieved               Plan - 04/04/19 0930    Clinical Impression Statement  A: Reassessment completed this session, pt has made great progress and has met all goals making improvements in ROM, strength, pain, and functional use of her RUE. Pt reports she is now able to sleep on her arm and use it during daily tasks with minimal difficulty. Occasionally has pain with lifting tasks, is not continuous pain. Educated pt on updated HEP, pt completing in clinic with good form. Pt is agreeable to discharge with HEP.    Body Structure / Function / Physical Skills  ADL;Endurance;UE functional use;Fascial restriction;Pain;ROM;IADL;Strength    Plan  P: Discharge pt       Patient will benefit from skilled therapeutic intervention in order to improve the following deficits and impairments:   Body Structure / Function / Physical Skills: ADL, Endurance, UE functional use, Fascial restriction, Pain, ROM, IADL, Strength       Visit Diagnosis: Acute pain of right shoulder  Other symptoms and signs involving the musculoskeletal system    Problem List Patient Active Problem List   Diagnosis Date Noted  . Stress headache 07/18/2017  . Attention deficit disorder (ADD) in adult 06/13/2014  . Left elbow pain 06/13/2014  . Herpes simplex 06/13/2014  . Left knee pain 06/13/2014  . Chest pain, atypical 04/11/2011  . Intermittent memory loss 02/18/2011  . Obesity 12/10/2010  . HTN (hypertension) 07/17/2010  . Asthma 01/29/2009   Guadelupe Sabin, OTR/L  [phone] 04/04/2019, 10:20 AM  [place], [place],  [address] Phone: [phone]   Fax:  [phone]  Name: Candice Parker MRN: [id number] Date of Birth: 1971/01/14   OCCUPATIONAL THERAPY DISCHARGE SUMMARY  Visits from Start of Care: 5  Current functional level related to goals / functional outcomes: See above. Pt has met all goals, has ROM and strength WNL and is using RUE for functional tasks without difficulty.    Remaining deficits: Occasional pain with lifting tasks   Education / Equipment: HEP for strengthening and stability Plan: Patient agrees to discharge.  Patient goals were met. Patient is being discharged due to meeting the stated rehab goals.  ?????

## 2019-04-04 NOTE — Patient Instructions (Addendum)
Theraband strengthening: Complete 10-15X, 1X/day  1) Shoulder protraction  Anchor band in doorway, stand with back to door. Push your hand forward as much as you can to bringing your shoulder blades forward on your rib cage.      2) Shoulder horizontal abduction  Standing with a theraband anchored at chest height, begin with arm straight and some tension in the band. Move your arm out to your side (keeping straight the whole time). Bring the affected arm back to midline.     3) Shoulder Internal Rotation  While holding an elastic band at your side with your elbow bent, start with your hand away from your stomach, then pull the band towards your stomach. Keep your elbow near your side the entire time.     4) Shoulder External Rotation  While holding an elastic band at your side with your elbow bent, start with your hand near your stomach and then pull the band away. Keep your elbow at your side the entire time.     5) Shoulder flexion  While standing with back to the door, holding Theraband at hand level, raise arm in front of you.  Keep elbow straight through entire movement.      6) Shoulder abduction  While holding an elastic band at your side, draw up your arm to the side keeping your elbow straight.    7) (Home) Extension: Isometric / Bilateral Arm Retraction - Sitting   Facing anchor, hold hands and elbow at shoulder height, with elbow bent.  Pull arms back to squeeze shoulder blades together. Repeat 10-15 times. 1-3 times/day.   8) (Clinic) Extension / Flexion (Assist)   Face anchor, pull arms back, keeping elbow straight, and squeze shoulder blades together. Repeat 10-15 times. 1-3 times/day.   Copyright  VHI. All rights reserved.   9) (Home) Retraction: Row - Bilateral (Anchor)   Facing anchor, arms reaching forward, pull hands toward stomach, keeping elbows bent and at your sides and pinching shoulder blades together. Repeat 10-15 times. 1-3  times/day.   Copyright  VHI. All rights reserved.

## 2019-04-06 ENCOUNTER — Ambulatory Visit (HOSPITAL_COMMUNITY): Payer: No Typology Code available for payment source | Admitting: Occupational Therapy

## 2019-05-15 ENCOUNTER — Other Ambulatory Visit: Payer: Self-pay

## 2019-05-15 ENCOUNTER — Encounter: Payer: Self-pay | Admitting: Family Medicine

## 2019-05-15 MED ORDER — ALBUTEROL SULFATE HFA 108 (90 BASE) MCG/ACT IN AERS: 1.0000 | INHALATION_SPRAY | Freq: Four times a day (QID) | RESPIRATORY_TRACT | 5 refills | Status: DC | PRN

## 2019-05-15 MED FILL — ALBUTEROL SULFATE HFA 108 (: 108 (90 BAS | 25 days supply | Qty: 18 | Fill #0

## 2019-05-24 ENCOUNTER — Telehealth (INDEPENDENT_AMBULATORY_CARE_PROVIDER_SITE_OTHER): Payer: No Typology Code available for payment source | Admitting: Family Medicine

## 2019-05-24 ENCOUNTER — Encounter: Payer: Self-pay | Admitting: Family Medicine

## 2019-05-24 DIAGNOSIS — R0981 Nasal congestion: Secondary | ICD-10-CM

## 2019-05-24 DIAGNOSIS — I10 Essential (primary) hypertension: Secondary | ICD-10-CM

## 2019-05-24 DIAGNOSIS — R519 Headache, unspecified: Secondary | ICD-10-CM | POA: Diagnosis not present

## 2019-05-24 MED ORDER — DOXYCYCLINE HYCLATE 100 MG PO TABS: 100.0000 mg | ORAL_TABLET | Freq: Two times a day (BID) | ORAL | 0 refills | Status: DC

## 2019-05-24 MED FILL — DOXYCYCLINE HYCLATE 100 MG: 100 | 7 days supply | Qty: 14 | Fill #0

## 2019-05-24 NOTE — Progress Notes (Signed)
Virtual Visit via Video Note  I connected with   on 05/24/19 at  3:20 PM EDT by a video enabled telemedicine application and verified that I am speaking with the correct person using two identifiers.  Location patient: home,  Location provider:work or home office Persons participating in the virtual visit: patient, provider  I discussed the limitations of evaluation and management by telemedicine and the availability of in person appointments. The patient expressed understanding and agreed to proceed.   HPI:  Acute virtual visit for Headaches: -Started about 3 - 4 days ago, off and on since then -Symptoms: R periorbital headache, some around near occiput and neck as well, sinus issues started a few weeks ago after working in a different building - now improved with allergy pill she is taking daily, L facial discomfort today once -she take ibuprofen 600mg  for the headaches which helps a lot, but then it comes back -sometimes feels a little dizzy right when she stands up after sitting for awhile -denies fevers, worst headache of life, thunderclap headache, vision changes, weakness, numbness, hearing or speech issues, cough, SOB, loss of taste or smell, NVD -has a history of headaches (tension and migraine) in the past, but they usually did not last this long -PMG obesity and HTN -Does not have BP cuff so does not have BP today  ROS: See pertinent positives and negatives per HPI.  Past Medical History:  Diagnosis Date  . Acid reflux 12/10/2010  . ASTHMA, INTRINSIC 01/29/2009  . Breast lesion 09/29/2011  . Hypertension   . Overweight(278.02) 12/10/2010    Past Surgical History:  Procedure Laterality Date  . ELBOW FRACTURE SURGERY    . INNER EAR SURGERY    . TOTAL ABDOMINAL HYSTERECTOMY     fibroid uterus.    Family History  Problem Relation Age of Onset  . Hypertension Mother   . Hyperlipidemia Mother   . Heart failure Father        drug induced  . Kidney disease Father        failure  . Breast cancer Paternal Grandmother   . Breast cancer Maternal Grandmother   . Hypertension Brother     SOCIAL HX: see hpi   Current Outpatient Medications:  .  acyclovir (ZOVIRAX) 400 MG tablet, Take 1 tablet (400 mg total) by mouth 5 (five) times daily. For 5 days- take at first sign of cold sore, Disp: 30 tablet, Rfl: 2 .  Acyclovir-Hydrocortisone 5-1 % CREA, Apply 5 times/day for 5 days, Disp: 5 g, Rfl: 2 .  ADDERALL XR 20 MG 24 hr capsule, TAKE 1 CAPSULE (20 MG TOTAL) BY MOUTH EVERY MORNING., Disp: 90 capsule, Rfl: 0 .  albuterol (VENTOLIN HFA) 108 (90 Base) MCG/ACT inhaler, Inhale 1-2 puffs into the lungs every 6 (six) hours as needed for wheezing or shortness of breath., Disp: 18 g, Rfl: 5 .  ibuprofen (ADVIL,MOTRIN) 200 MG tablet, Take 400 mg by mouth every 6 (six) hours as needed., Disp: , Rfl:  .  lisinopril-hydrochlorothiazide (PRINZIDE,ZESTORETIC) 20-12.5 MG tablet, Take 1 tablet by mouth daily., Disp: 90 tablet, Rfl: 3 .  Multiple Vitamin (MULTIVITAMIN) tablet, Take 1 tablet by mouth daily. Women's One-A-Day, Disp: , Rfl:  .  nitroGLYCERIN (NITRODUR - DOSED IN MG/24 HR) 0.2 mg/hr patch, Apply 1/4 patch daily to right shoulder tendon for tendonitis., Disp: 30 patch, Rfl: 1  EXAM:  VITALS per patient if applicable:  GENERAL: alert, oriented, appears well and in no acute distress  HEENT: atraumatic, conjunttiva  clear, PER, no obvious abnormalities on inspection of external nose and ears  NECK: normal movements of the head and neck, normal rotation of the head and neck w/out symptoms, normal extension and chin to chest without symptoms  LUNGS: on inspection no signs of respiratory distress, breathing rate appears normal, no obvious gross SOB, gasping or wheezing  CV: no obvious cyanosis  MS: moves all visible extremities without noticeable abnormality  PSYCH/NEURO: pleasant and cooperative, no obvious depression or anxiety, no facial asymmetry, EOMI, reports  vision intact, reports hearing intact, normal facial sensitivity to touch on pt self exam throughout, normal symmetry of  with sticking out tongue, smiling, wrinkling forehead, shrugging shoulders, speech and thought processing grossly intact  ASSESSMENT AND PLAN:  Discussed the following assessment and plan:  Nonintractable headache, unspecified chronicity pattern, unspecified headache type  Sinus congestion  Essential hypertension  -we discussed possible serious and likely etiologies, options for evaluation and workup, limitations of telemedicine visit vs in person visit, treatment, treatment risks and precautions. Pt prefers to treat via telemedicine empirically rather then risking or undertaking an in person visit at this moment. Discussed various causes of periorbital headaches and query migraine or tension type headache vs sinusitis vs other. Currently no headache. opted to try treatment with doxycycline in case of sinusitis, tylenol/excedrin rather than ibuprofen if headache again given htn. May benefit from migraine specific medication if not resolving and other causes ruled out. She has a hx of HTN and BP was elevated at last office visit. Discussed needs BP check and she plans to go to drugstore to check and seek in-person care if elevated, any worsening of symptoms or symptoms not resolving promptly with above.She agrees to seek emergency care if any severe symptoms.She also is going to undergo COVID testing and discussed options and limitations. She also had question about getting her second COVID vaccine and these were answered as well.   I discussed the assessment and treatment plan with the patient. The patient was provided an opportunity to ask questions and all were answered. The patient agreed with the plan and demonstrated an understanding of the instructions.   The patient was advised to call back or seek an in-person evaluation if the symptoms worsen or if the condition fails to  improve as anticipated.   Candice Kern, DO

## 2019-05-25 ENCOUNTER — Other Ambulatory Visit: Payer: Self-pay | Admitting: Family Medicine

## 2019-05-25 MED FILL — LISINOPRIL-HCTZ 20-12.5 MG: 20-12.5 | 90 days supply | Qty: 90 | Fill #0

## 2019-06-15 ENCOUNTER — Other Ambulatory Visit: Payer: Self-pay

## 2019-06-15 ENCOUNTER — Other Ambulatory Visit: Payer: Self-pay | Admitting: Family Medicine

## 2019-06-15 ENCOUNTER — Encounter: Payer: Self-pay | Admitting: Family Medicine

## 2019-06-15 MED ORDER — ACYCLOVIR-HYDROCORTISONE 5-1 % EX CREA: TOPICAL_CREAM | CUTANEOUS | 2 refills | Status: DC

## 2019-06-15 MED ORDER — ACYCLOVIR 400 MG PO TABS: 400.0000 mg | ORAL_TABLET | Freq: Every day | ORAL | 2 refills | Status: DC

## 2019-06-15 MED ORDER — AMPHETAMINE-DEXTROAMPHET ER 20 MG PO CP24: 20.0000 mg | ORAL_CAPSULE | Freq: Every day | ORAL | 0 refills | Status: DC

## 2019-06-15 MED FILL — ADDERALL XR 20 MG CAP SA: 20 | 90 days supply | Qty: 90 | Fill #0

## 2019-06-15 MED FILL — ACYCLOVIR 400 MG TABLET: 400 | 6 days supply | Qty: 30 | Fill #0

## 2019-06-15 NOTE — Telephone Encounter (Signed)
LAST APPOINTMENT DATE: 05/25/2019   NEXT APPOINTMENT DATE: Visit date not found   Rx adderall LAST REFILL: 02/24/2019  QTY:#[id number]

## 2019-06-19 ENCOUNTER — Other Ambulatory Visit: Payer: Self-pay | Admitting: Family Medicine

## 2019-06-19 DIAGNOSIS — Z1231 Encounter for screening mammogram for malignant neoplasm of breast: Secondary | ICD-10-CM

## 2019-06-20 ENCOUNTER — Other Ambulatory Visit: Payer: Self-pay

## 2019-06-20 MED ORDER — ACYCLOVIR 5 % EX OINT: 1.0000 "application " | TOPICAL_OINTMENT | CUTANEOUS | 1 refills | Status: DC

## 2019-08-03 ENCOUNTER — Other Ambulatory Visit: Payer: Self-pay

## 2019-08-03 ENCOUNTER — Ambulatory Visit
Admission: RE | Admit: 2019-08-03 | Discharge: 2019-08-03 | Disposition: A | Discharge status: Home / Self Care | Payer: No Typology Code available for payment source | Source: Ambulatory Visit

## 2019-08-03 DIAGNOSIS — Z1231 Encounter for screening mammogram for malignant neoplasm of breast: Secondary | ICD-10-CM

## 2019-08-14 ENCOUNTER — Encounter: Payer: Self-pay | Admitting: Family Medicine

## 2019-08-14 ENCOUNTER — Telehealth (INDEPENDENT_AMBULATORY_CARE_PROVIDER_SITE_OTHER): Payer: No Typology Code available for payment source | Admitting: Family Medicine

## 2019-08-14 ENCOUNTER — Telehealth: Payer: No Typology Code available for payment source | Admitting: Physician Assistant

## 2019-08-14 VITALS — Temp 97.1°F | Ht 67.5 in | Wt 238.0 lb

## 2019-08-14 DIAGNOSIS — R519 Headache, unspecified: Secondary | ICD-10-CM

## 2019-08-14 DIAGNOSIS — R5383 Other fatigue: Secondary | ICD-10-CM | POA: Diagnosis not present

## 2019-08-14 DIAGNOSIS — J3489 Other specified disorders of nose and nasal sinuses: Secondary | ICD-10-CM | POA: Diagnosis not present

## 2019-08-14 DIAGNOSIS — R0981 Nasal congestion: Secondary | ICD-10-CM | POA: Diagnosis not present

## 2019-08-14 MED ORDER — PREDNISONE 20 MG PO TABS
ORAL_TABLET | ORAL | 0 refills | Status: DC
Start: 2019-08-14 — End: 2020-04-09

## 2019-08-14 MED FILL — predniSONE 20 MG TABS: 20 | 7 days supply | Qty: 10 | Fill #0

## 2019-08-14 NOTE — Patient Instructions (Signed)
Health Maintenance Due  Topic Date Due  . Hepatitis C Screening  Never done  . COVID-19 Vaccine (1) Never done      Recommended follow up: No follow-ups on file.

## 2019-08-14 NOTE — Progress Notes (Deleted)
Virtual Visit via Video   I connected with Candice Parker on 08/14/19 at 12:30 PM EDT by a video enabled telemedicine application and verified that I am speaking with the correct person using two identifiers. Location patient: Home Location provider: [facility], Office Persons participating in the virtual visit: Jenean, [place] PA-C, Anselmo Pickler, LPN   I discussed the limitations of evaluation and management by telemedicine and the availability of in person appointments. The patient expressed understanding and agreed to proceed.  I acted as a Education administrator for [employer], [employer], LPN   Subjective:   HPI:  Sinus problem   ROS: See pertinent positives and negatives per HPI.  Patient Active Problem List   Diagnosis Date Noted  . Stress headache 07/18/2017  . Attention deficit disorder (ADD) in adult 06/13/2014  . Left elbow pain 06/13/2014  . Herpes simplex 06/13/2014  . Left knee pain 06/13/2014  . Chest pain, atypical 04/11/2011  . Intermittent memory loss 02/18/2011  . Obesity 12/10/2010  . HTN (hypertension) 07/17/2010  . Asthma 01/29/2009    Social History   Tobacco Use  . Smoking status: Never Smoker  . Smokeless tobacco: Never Used  Substance Use Topics  . Alcohol use: Yes    Alcohol/week: 0.0 standard drinks    Comment: Occasional    Current Outpatient Medications:  .  acyclovir (ZOVIRAX) 400 MG tablet, Take 1 tablet (400 mg total) by mouth 5 (five) times daily. For 5 days- take at first sign of cold sore, Disp: 30 tablet, Rfl: 2 .  acyclovir ointment (ZOVIRAX) 5 %, Apply 1 application topically every 3 (three) hours., Disp: 30 g, Rfl: 1 .  Acyclovir-Hydrocortisone 5-1 % CREA, Apply 5 times/day for 5 days, Disp: 5 g, Rfl: 2 .  albuterol (VENTOLIN HFA) 108 (90 Base) MCG/ACT inhaler, Inhale 1-2 puffs into the lungs every 6 (six) hours as needed for wheezing or shortness of breath., Disp: 18 g, Rfl: 5 .   amphetamine-dextroamphetamine (ADDERALL XR) 20 MG 24 hr capsule, Take 1 capsule (20 mg total) by mouth daily., Disp: 90 capsule, Rfl: 0 .  doxycycline (VIBRA-TABS) 100 MG tablet, Take 1 tablet (100 mg total) by mouth 2 (two) times daily., Disp: 14 tablet, Rfl: 0 .  ibuprofen (ADVIL,MOTRIN) 200 MG tablet, Take 400 mg by mouth every 6 (six) hours as needed., Disp: , Rfl:  .  lisinopril-hydrochlorothiazide (ZESTORETIC) 20-12.5 MG tablet, TAKE 1 TABLET BY MOUTH DAILY., Disp: 90 tablet, Rfl: 3 .  Multiple Vitamin (MULTIVITAMIN) tablet, Take 1 tablet by mouth daily. Women's One-A-Day, Disp: , Rfl:  .  nitroGLYCERIN (NITRODUR - DOSED IN MG/24 HR) 0.2 mg/hr patch, Apply 1/4 patch daily to right shoulder tendon for tendonitis., Disp: 30 patch, Rfl: 1  No Known Allergies  Objective:   VITALS: Per patient if applicable, see vitals. GENERAL: Alert, appears well and in no acute distress. HEENT: Atraumatic, conjunctiva clear, no obvious abnormalities on inspection of external nose and ears. NECK: Normal movements of the head and neck. CARDIOPULMONARY: No increased WOB. Speaking in clear sentences. I:E ratio WNL.  MS: Moves all visible extremities without noticeable abnormality. PSYCH: Pleasant and cooperative, well-groomed. Speech normal rate and rhythm. Affect is appropriate. Insight and judgement are appropriate. Attention is focused, linear, and appropriate.  NEURO: CN grossly intact. Oriented as arrived to appointment on time with no prompting. Moves both UE equally.  SKIN: No obvious lesions, wounds, erythema, or cyanosis noted on face or hands.  Assessment and Plan:  There are no diagnoses linked to this encounter.  . Reviewed expectations re: course of current medical issues. . Discussed self-management of symptoms. . Outlined signs and symptoms indicating need for more acute intervention. . Patient verbalized understanding and all questions were answered. Marland Kitchen Health Maintenance issues including  appropriate healthy diet, exercise, and smoking avoidance were discussed with patient. . See orders for this visit as documented in the electronic medical record.  I discussed the assessment and treatment plan with the patient. The patient was provided an opportunity to ask questions and all were answered. The patient agreed with the plan and demonstrated an understanding of the instructions.   The patient was advised to call back or seek an in-person evaluation if the symptoms worsen or if the condition fails to improve as anticipated.   ***  Anselmo Pickler, LPN 03/15/1896

## 2019-08-14 NOTE — Progress Notes (Signed)
Phone [phone] Virtual visit via Video note   Subjective:  Chief complaint: Chief Complaint  Patient presents with  . Sinusitis    x 4 days     This visit type was conducted due to national recommendations for restrictions regarding the COVID-19 Pandemic (e.g. social distancing).  This format is felt to be most appropriate for this patient at this time balancing risks to patient and risks to population by having him in for in person visit.  No physical exam was performed (except for noted visual exam or audio findings with Telehealth visits).    Our team/I connected with Alvin Critchley at  4:40 PM EDT by a video enabled telemedicine application (doxy.me or caregility through epic) and verified that I am speaking with the correct person using two identifiers.  Location patient: [place] Location provider: [facility], office Persons participating in the virtual visit:  patient  Our team/I discussed the limitations of evaluation and management by telemedicine and the availability of in person appointments. In light of current covid-19 pandemic, patient also understands that we are trying to protect them by minimizing in office contact if at all possible.  The patient expressed consent for telemedicine visit and agreed to proceed. Patient understands insurance will be billed.   Past Medical History-  Patient Active Problem List   Diagnosis Date Noted  . Attention deficit disorder (ADD) in adult 06/13/2014    Priority: High  . Left knee pain 06/13/2014    Priority: Medium  . HTN (hypertension) 07/17/2010    Priority: Medium  . Stress headache 07/18/2017    Priority: Low  . Left elbow pain 06/13/2014    Priority: Low  . Herpes simplex 06/13/2014    Priority: Low  . Chest pain, atypical 04/11/2011    Priority: Low  . Intermittent memory loss 02/18/2011    Priority: Low  . Obesity 12/10/2010    Priority: Low  . Asthma 01/29/2009    Priority: Low    Medications- reviewed  and updated Current Outpatient Medications  Medication Sig Dispense Refill  . acyclovir (ZOVIRAX) 400 MG tablet Take 1 tablet (400 mg total) by mouth 5 (five) times daily. For 5 days- take at first sign of cold sore 30 tablet 2  . acyclovir ointment (ZOVIRAX) 5 % Apply 1 application topically every 3 (three) hours. 30 g 1  . Acyclovir-Hydrocortisone 5-1 % CREA Apply 5 times/day for 5 days 5 g 2  . albuterol (VENTOLIN HFA) 108 (90 Base) MCG/ACT inhaler Inhale 1-2 puffs into the lungs every 6 (six) hours as needed for wheezing or shortness of breath. 18 g 5  . amphetamine-dextroamphetamine (ADDERALL XR) 20 MG 24 hr capsule Take 1 capsule (20 mg total) by mouth daily. 90 capsule 0  . ibuprofen (ADVIL,MOTRIN) 200 MG tablet Take 400 mg by mouth every 6 (six) hours as needed.    Marland Kitchen lisinopril-hydrochlorothiazide (ZESTORETIC) 20-12.5 MG tablet TAKE 1 TABLET BY MOUTH DAILY. 90 tablet 3  . Multiple Vitamin (MULTIVITAMIN) tablet Take 1 tablet by mouth daily. Women's One-A-Day    . predniSONE (DELTASONE) 20 MG tablet Take 2 pills for 3 days, 1 pill for 4 days 10 tablet 0   No current facility-administered medications for this visit.      Objective:  Temp (!) 97.1 F (36.2 C) (Temporal)   Ht 5' 7.5" (1.715 m)   Wt 238 lb (108 kg)   LMP  (LMP Unknown)   BMI 36.73 kg/m  self reported vitals  Nonlabored voice, normal  speech      Assessment and Plan    # Sinus pressure and congestion- suspected viral sinusitis based on duration S:Started about 4 days ago (late Friday or Saturday). Patient has had facial pain around eyes with headache. Notes nasal congestion. Both frontal and maxillary sinus pressure.  Cough (productive tan color at times clear), drainage. mild sore throat. She denies any ear pain or fever. Has been using over the counter medications with little improvement. Feels really fatigued. Notes some watering of eyes. No recent tick bites.   Coricidin HBP day and night gives some help.  Humidifier last night - had a lot of tan discharge this AM.   She has had first covid vaccine in February but not the second.   A lot of work related stress/changes- has had headaches in the past. One of her doctors resigned out of the blue and that is challenging.  A/P: could be viral sinusitis based off of only 4 days of symptoms. Considering symptoms have been worsening since it started though im also concerned for possible bacterial sinusitis. We opted to try a course of prednisone (bp ok last visit) and if not starting to improve in 1-2 days or persists at same level through Friday then would send in course of antibiotics (or if symptoms continue to worsen)  Patient with symptoms concerning for potential covid 19 and only partially vaccinated Therefore: -information provided on testing scheduling "please text "COVID" to [phone], OR you can log on to [email or web address] to easily make an on-line appointment. "  - recommended patient watch closely for shortness of breath or confusion or worsening symptoms and if those occur he should contact us immediately  -recommended patient consider purchasing pulse oximeter and if levels 94% or below persistently- seek care at the hospital. Tends to get headaches with stress and has had a lot of worsk stress lately so that could contribute to headache portion.  -recommended self isolation until negative test  at minimum.  - for quarantine if covid 19 test positive would need to be at least 10 days since first symptom AND at least 24 hours fever free without fever reducing medications AND improvement in respiratory symptoms  - we also discussed close contacts would need 14 day quarantine after last close contact with patient IF patient is positive  And they are not vaccinated -Hopeful results back within 48 hours of test but may take up to a week  - work note not needed- can work from home.   Recommended follow up: Return for as needed for new, worsening,  persistent symptoms.  Lab/Order associations:   ICD-10-CM   1. Nasal congestion  R09.81   2. Fatigue, unspecified type  R53.83   3. Sinus headache  R51.9   4. Sinus pressure  J34.89     Meds ordered this encounter  Medications  . predniSONE (DELTASONE) 20 MG tablet    Sig: Take 2 pills for 3 days, 1 pill for 4 days    Dispense:  10 tablet    Refill:  0    Return precautions advised.  Garret Reddish, MD

## 2019-09-05 MED FILL — LISINOPRIL-HCTZ 20-12.5 MG: 20-12.5 | 90 days supply | Qty: 90 | Fill #1

## 2019-10-23 ENCOUNTER — Other Ambulatory Visit: Payer: Self-pay | Admitting: Family Medicine

## 2019-10-23 MED ORDER — AMPHETAMINE-DEXTROAMPHET ER 20 MG PO CP24: 20.0000 mg | ORAL_CAPSULE | Freq: Every day | ORAL | 0 refills | Status: DC

## 2019-10-23 MED FILL — ADDERALL XR 20 MG CAP SA: 20 | 90 days supply | Qty: 90 | Fill #0

## 2019-12-12 MED FILL — LISINOPRIL-HCTZ 20-12.5 MG: 20-12.5 | 90 days supply | Qty: 90 | Fill #2

## 2020-02-05 ENCOUNTER — Other Ambulatory Visit: Payer: Self-pay | Admitting: Family Medicine

## 2020-02-05 NOTE — Telephone Encounter (Signed)
02LAST APPOINTMENT DATE: 10/10/2019   NEXT APPOINTMENT DATE: 04/09/2020    LAST REFILL: 10/23/2019  QTY:  90

## 2020-02-06 ENCOUNTER — Other Ambulatory Visit: Payer: Self-pay | Admitting: Family Medicine

## 2020-02-06 MED ORDER — AMPHETAMINE-DEXTROAMPHET ER 20 MG PO CP24: 20.0000 mg | ORAL_CAPSULE | Freq: Every day | ORAL | 0 refills | Status: DC

## 2020-02-06 MED FILL — ADDERALL XR 20 MG CAP SA: 20 | 90 days supply | Qty: 90 | Fill #0

## 2020-03-20 MED FILL — LISINOPRIL-HCTZ 20-12.5 MG: 20-12.5 | 90 days supply | Qty: 90 | Fill #3

## 2020-04-07 NOTE — Progress Notes (Signed)
Phone [phone]   Subjective:  Patient presents today for their annual physical. Chief complaint-noted.   See problem oriented charting- ROS- full  review of systems was completed and negative except for: chest pain (see separate note), dizziness, headaches, lightheadendss  The following were reviewed and entered/updated in epic: Past Medical History:  Diagnosis Date  . Acid reflux 12/10/2010  . ASTHMA, INTRINSIC 01/29/2009  . Breast lesion 09/29/2011  . Hypertension   . Overweight(278.02) 12/10/2010   Patient Active Problem List   Diagnosis Date Noted  . Attention deficit disorder (ADD) in adult 06/13/2014    Priority: High  . Left knee pain 06/13/2014    Priority: Medium  . HTN (hypertension) 07/17/2010    Priority: Medium  . Stress headache 07/18/2017    Priority: Low  . Left elbow pain 06/13/2014    Priority: Low  . Herpes simplex 06/13/2014    Priority: Low  . Chest pain, atypical 04/11/2011    Priority: Low  . Intermittent memory loss 02/18/2011    Priority: Low  . Obesity 12/10/2010    Priority: Low  . Asthma 01/29/2009    Priority: Low   Past Surgical History:  Procedure Laterality Date  . ELBOW FRACTURE SURGERY    . INNER EAR SURGERY    . TOTAL ABDOMINAL HYSTERECTOMY     fibroid uterus.    Family History  Problem Relation Age of Onset  . Hypertension Mother   . Hyperlipidemia Mother   . Heart failure Father        drug induced  . Kidney disease Father        failure  . Breast cancer Paternal Grandmother   . Breast cancer Maternal Grandmother   . Hypertension Brother     Medications- reviewed and updated Current Outpatient Medications  Medication Sig Dispense Refill  . acyclovir (ZOVIRAX) 400 MG tablet Take 1 tablet (400 mg total) by mouth 5 (five) times daily. For 5 days- take at first sign of cold sore (Patient taking differently: Take 400 mg by mouth as needed. For 5 days- take at first sign of cold sore) 30 tablet 2  . acyclovir ointment  (ZOVIRAX) 5 % Apply 1 application topically every 3 (three) hours. 30 g 1  . Acyclovir-Hydrocortisone 5-1 % CREA Apply 5 times/day for 5 days 5 g 2  . albuterol (VENTOLIN HFA) 108 (90 Base) MCG/ACT inhaler Inhale 1-2 puffs into the lungs every 6 (six) hours as needed for wheezing or shortness of breath. 18 g 5  . amphetamine-dextroamphetamine (ADDERALL XR) 20 MG 24 hr capsule Take 1 capsule (20 mg total) by mouth daily. 90 capsule 0  . ibuprofen (ADVIL,MOTRIN) 200 MG tablet Take 400 mg by mouth every 6 (six) hours as needed.    Marland Kitchen lisinopril-hydrochlorothiazide (ZESTORETIC) 20-12.5 MG tablet TAKE 1 TABLET BY MOUTH DAILY. 90 tablet 3  . Multiple Vitamin (MULTIVITAMIN) tablet Take 1 tablet by mouth daily. Women's One-A-Day     No current facility-administered medications for this visit.    Allergies-reviewed and updated No Known Allergies  Social History   Social History Narrative   Single. 30 year son in school at [employer] in 2018- doing [employer] type work still wants to play basketball overseas.       Works for [employer] at [employer] in school- finishing up [place]: enjoys going out to eat, travel    Objective  Objective:  BP 126/82   Pulse 74  Temp (!) 97.2 F (36.2 C) (Temporal)   Ht 5\' 8"  (1.727 m)   Wt 239 lb (108.4 kg)   LMP  (LMP Unknown)   SpO2 98%   BMI 36.34 kg/m  Gen: NAD, resting comfortably HEENT: Mucous membranes are moist. Oropharynx normal Neck: no thyromegaly CV: RRR no murmurs rubs or gallops Lungs: CTAB no crackles, wheeze, rhonchi Abdomen: soft/nontender/nondistended/normal bowel sounds. No rebound or guarding.  Ext: no edema Skin: warm, dry Neuro: grossly normal, moves all extremities, PERRLA   Assessment and Plan   50 y.o. female presenting for annual physical.  Health Maintenance counseling: 1. Anticipatory guidance: Patient counseled regarding regular dental exams -q6 months, eye exams - no recent  visits- readers help some- has had some BV if staring at screen long term,  avoiding smoking and second hand smoke , limiting alcohol to 1 beverage per day- glass of wine at night .   2. Risk factor reduction:  Advised patient of need for regular exercise and diet rich and fruits and vegetables to reduce risk of heart attack and stroke. Exercise- prior trainer moved to [place]- considering joining a gym- I encouraged her to consider after we work up chest pain further. Diet-she is considering intermittent fasting.  Wt Readings from Last 3 Encounters:  04/09/20 239 lb (108.4 kg)  08/14/19 238 lb (108 kg)  03/15/19 234 lb (106.1 kg)  3. Immunizations/screenings/ancillary studies- will call with covid 19 dates. HCV screen today.  Immunization History  Administered Date(s) Administered  . Influenza Whole 11/09/2018  . Influenza-Unspecified 11/21/2013, 11/23/2014, 11/24/2015, 11/19/2016, 11/09/2019  . Td 10/02/2009  . Tdap 06/12/2015  4. Cervical cancer screening- total hysterectomy reported- for fibroids so does not get regular pap smears 5. Breast cancer screening-  breast exam declines- self exams only and mammogram - does these yearly 6. Colon cancer screening - refer to GI with new screening guidelines down at 45 7. Skin cancer screening- lower risk due to melanin content. advised regular sunscreen use. Denies worrisome, changing, or new skin lesions.  8. Birth control/STD check- total hysterectomy. Dating someone for almost 2 years but wants to be safe and screen for STDs 9. Osteoporosis screening at 55- will plan on this -never smoker  Status of chronic or acute concerns   # Attention Deficit Disorder in Adult S:compliant with Adderall XR 20 mg daily.  Controlling symptoms  Reasonably well- wants to consider coming off of this. Has some natural products shed like to consider - helps her manage both school at [employer] and work- took off this semester though.  - mainly takes during the  week - controlled substance contract 07/18/17  -UDS 07/18/17 - will update today - original diagnosis by Dr. Cheryln Manly of [facility] A/P: reasonably stable. Considering CP happening also on days not on this- do not think primary driver. She would liek to come off eventually and I think that's reasonable but perhaps finish with school first.    #hypertension S: compliant with Lisinopril-HTCZ 20-12.5 mg.  A/P: Stable. Continue current medications.   # Asthma S:Using Albuterol HFA prn- hasnt used in many months.  No recent issues  A/P: Stable. Continue current medications.   # Herpes Simplex S:Using  Acyclovir or Acyclovir-Hydrocortisone cream prn for cold sores.  A/P: no recent issues in over a year but likes to keep on hand.    # Chest pain - see separate problem oriented note  Recommended follow up: Return in about 6 months (around 10/10/2020) for follow up- or  sooner if needed. Future Appointments  Date Time Provider [place]  08/04/2020  7:30 AM GI-[facility]G MM 2 GI-[facility] GI-BRE[facility]   Lab/Order associations: fasting   ICD-10-CM   1. Preventative health care  Z00.00 Ambulatory referral to Gastroenterology    CBC with Differential/Platelet    Comprehensive metabolic panel    Lipid panel    Hepatitis C antibody    DRUG MONITORING, PANEL 8 WITH CONFIRMATION, URINE    HIV Antibody (routine testing w rflx)    RPR    Urine cytology ancillary only    Urine cytology ancillary only  2. Primary hypertension  I10 CBC with Differential/Platelet    Comprehensive metabolic panel    Lipid panel  3. Mild intermittent asthma without complication  S49.67   4. Attention deficit disorder (ADD) in adult  F98.8   5. Encounter for hepatitis C screening test for low risk patient  Z11.59 Hepatitis C antibody  6. Encounter for screening colonoscopy  Z12.11 Ambulatory referral to Gastroenterology  7. High risk medication use  Z79.899 DRUG MONITORING, PANEL 8 WITH CONFIRMATION, URINE   8. Screening for HIV (human immunodeficiency virus)  Z11.4 HIV Antibody (routine testing w rflx)  9. Screening examination for venereal disease  Z11.3 RPR  10. Screening for gonorrhea  Z11.3 Urine cytology ancillary only    Urine cytology ancillary only  11. Screening for chlamydial disease  Z11.8 Urine cytology ancillary only    Urine cytology ancillary only   Return precautions advised.  Garret Reddish, MD

## 2020-04-07 NOTE — Patient Instructions (Addendum)
Please stop by lab before you go If you have mychart- we will send your results within 3 business days of [place] receiving them.  If you do not have mychart- we will call you about results within 5 business days of [place] receiving them.  *please also note that you will see labs on mychart as soon as they post. I will later go in and write notes on them- will say "notes from Dr. Yong Channel"  We will call you within two weeks about your referral to GI. If you do not hear within 2 weeks, give [place] a call.   Consider Dr. Isabella Stalling channel- start with the leslie diet  Health Maintenance Due  Topic Date Due  . Hepatitis C Screening Done today. Never done  . COVID-19 Vaccine (1) will call with dates.  Never done  . COLONOSCOPY (Pts 45-20yrs Insurance coverage will need to be confirmed) Please discuss. Never done   We will call you within two weeks about your referral for coronary calcium scoring. If you do not hear within 2 weeks, give [place] a call. Lets hold off on exercise until we get this back. If you have worsening pain please seek care.    Recommended follow up: Return in about 6 months (around 10/10/2020) for follow up- or sooner if needed.

## 2020-04-09 ENCOUNTER — Other Ambulatory Visit: Payer: Self-pay

## 2020-04-09 ENCOUNTER — Other Ambulatory Visit (HOSPITAL_COMMUNITY)
Admission: RE | Admit: 2020-04-09 | Discharge: 2020-04-09 | Disposition: A | Discharge status: Home / Self Care | Payer: No Typology Code available for payment source | Source: Ambulatory Visit | Attending: Family Medicine | Admitting: Family Medicine

## 2020-04-09 ENCOUNTER — Encounter: Payer: Self-pay | Admitting: Family Medicine

## 2020-04-09 ENCOUNTER — Ambulatory Visit (INDEPENDENT_AMBULATORY_CARE_PROVIDER_SITE_OTHER): Payer: No Typology Code available for payment source | Admitting: Family Medicine

## 2020-04-09 ENCOUNTER — Other Ambulatory Visit: Payer: Self-pay | Admitting: Family Medicine

## 2020-04-09 VITALS — BP 126/82 | HR 74 | Temp 97.2°F | Ht 68.0 in | Wt 239.0 lb

## 2020-04-09 DIAGNOSIS — R079 Chest pain, unspecified: Secondary | ICD-10-CM

## 2020-04-09 DIAGNOSIS — Z118 Encounter for screening for other infectious and parasitic diseases: Secondary | ICD-10-CM

## 2020-04-09 DIAGNOSIS — F988 Other specified behavioral and emotional disorders with onset usually occurring in childhood and adolescence: Secondary | ICD-10-CM

## 2020-04-09 DIAGNOSIS — I10 Essential (primary) hypertension: Secondary | ICD-10-CM

## 2020-04-09 DIAGNOSIS — Z1159 Encounter for screening for other viral diseases: Secondary | ICD-10-CM

## 2020-04-09 DIAGNOSIS — Z0000 Encounter for general adult medical examination without abnormal findings: Secondary | ICD-10-CM | POA: Insufficient documentation

## 2020-04-09 DIAGNOSIS — Z1211 Encounter for screening for malignant neoplasm of colon: Secondary | ICD-10-CM

## 2020-04-09 DIAGNOSIS — R0789 Other chest pain: Secondary | ICD-10-CM | POA: Diagnosis not present

## 2020-04-09 DIAGNOSIS — Z0001 Encounter for general adult medical examination with abnormal findings: Secondary | ICD-10-CM

## 2020-04-09 DIAGNOSIS — J4520 Mild intermittent asthma, uncomplicated: Secondary | ICD-10-CM

## 2020-04-09 DIAGNOSIS — Z113 Encounter for screening for infections with a predominantly sexual mode of transmission: Secondary | ICD-10-CM

## 2020-04-09 DIAGNOSIS — Z79899 Other long term (current) drug therapy: Secondary | ICD-10-CM

## 2020-04-09 DIAGNOSIS — Z114 Encounter for screening for human immunodeficiency virus [HIV]: Secondary | ICD-10-CM

## 2020-04-09 LAB — LIPID PANEL
Cholesterol: 153 mg/dL (ref 0–200)
HDL: 50.8 mg/dL (ref >39.00)
LDL Cholesterol: 85 mg/dL (ref 0–99)
NonHDL: 102.18
Total CHOL/HDL Ratio: 3
Triglycerides: 88 mg/dL (ref 0.0–149.0)
VLDL: 17.6 mg/dL (ref 0.0–40.0)

## 2020-04-09 LAB — COMPREHENSIVE METABOLIC PANEL
ALT: 18 U/L (ref 0–35)
AST: 21 U/L (ref 0–37)
Albumin: 3.8 g/dL (ref 3.5–5.2)
Alkaline Phosphatase: 60 U/L (ref 39–117)
BUN: 12 mg/dL (ref 6–23)
CO2: 31 mEq/L (ref 19–32)
Calcium: 9.3 mg/dL (ref 8.4–10.5)
Chloride: 103 mEq/L (ref 96–112)
Creatinine, Ser: 0.93 mg/dL (ref 0.40–1.20)
GFR: 73.07 mL/min (ref >60.00)
Glucose, Bld: 92 mg/dL (ref 70–99)
Potassium: 3.2 mEq/L — ABNORMAL LOW (ref 3.5–5.1)
Sodium: 139 mEq/L (ref 135–145)
Total Bilirubin: 0.6 mg/dL (ref 0.2–1.2)
Total Protein: 7.5 g/dL (ref 6.0–8.3)

## 2020-04-09 LAB — CBC WITH DIFFERENTIAL/PLATELET
Basophils Absolute: 0.1 10*3/uL (ref 0.0–0.1)
Basophils Relative: 1.2 % (ref 0.0–3.0)
Eosinophils Absolute: 0.2 10*3/uL (ref 0.0–0.7)
Eosinophils Relative: 3.2 % (ref 0.0–5.0)
HCT: 37.7 % (ref 36.0–46.0)
Hemoglobin: 13.1 g/dL (ref 12.0–15.0)
Lymphocytes Relative: 45.3 % (ref 12.0–46.0)
Lymphs Abs: 2.2 10*3/uL (ref 0.7–4.0)
MCHC: 34.9 g/dL (ref 30.0–36.0)
MCV: 87.8 fl (ref 78.0–100.0)
Monocytes Absolute: 0.4 10*3/uL (ref 0.1–1.0)
Monocytes Relative: 7.9 % (ref 3.0–12.0)
Neutro Abs: 2 10*3/uL (ref 1.4–7.7)
Neutrophils Relative %: 42.4 % — ABNORMAL LOW (ref 43.0–77.0)
Platelets: 291 10*3/uL (ref 150.0–400.0)
RBC: 4.3 Mil/uL (ref 3.87–5.11)
RDW: 13.2 % (ref 11.5–15.5)
WBC: 4.8 10*3/uL (ref 4.0–10.5)

## 2020-04-09 MED ORDER — AMPHETAMINE-DEXTROAMPHET ER 20 MG PO CP24: 20.0000 mg | ORAL_CAPSULE | Freq: Every day | ORAL | 0 refills | Status: DC

## 2020-04-09 MED ORDER — POTASSIUM CHLORIDE CRYS ER 20 MEQ PO TBCR: EXTENDED_RELEASE_TABLET | ORAL | 0 refills | Status: DC

## 2020-04-09 MED FILL — POTASSIUM CHLORIDE CRYS ER: 20 | 10 days supply | Qty: 10 | Fill #0

## 2020-04-09 NOTE — Progress Notes (Addendum)
Phone [phone] In person visit   Subjective:   Candice Parker is a 50 y.o. year old very pleasant female patient who presents for/with See problem oriented charting Chief Complaint  Patient presents with  . Chest Pain- see separate problem oriented chart   This visit occurred during the SARS-CoV-2 public health emergency.  Safety protocols were in place, including screening questions prior to the visit, additional usage of staff PPE, and extensive cleaning of exam room while observing appropriate contact time as indicated for disinfecting solutions.   Past Medical History-  Patient Active Problem List   Diagnosis Date Noted  . Attention deficit disorder (ADD) in adult 06/13/2014    Priority: High  . Left knee pain 06/13/2014    Priority: Medium  . HTN (hypertension) 07/17/2010    Priority: Medium  . Stress headache 07/18/2017    Priority: Low  . Left elbow pain 06/13/2014    Priority: Low  . Herpes simplex 06/13/2014    Priority: Low  . Chest pain, atypical 04/11/2011    Priority: Low  . Intermittent memory loss 02/18/2011    Priority: Low  . Obesity 12/10/2010    Priority: Low  . Asthma 01/29/2009    Priority: Low    Medications- reviewed and updated Current Outpatient Medications  Medication Sig Dispense Refill  . acyclovir (ZOVIRAX) 400 MG tablet Take 1 tablet (400 mg total) by mouth 5 (five) times daily. For 5 days- take at first sign of cold sore (Patient taking differently: Take 400 mg by mouth as needed. For 5 days- take at first sign of cold sore) 30 tablet 2  . acyclovir ointment (ZOVIRAX) 5 % Apply 1 application topically every 3 (three) hours. 30 g 1  . Acyclovir-Hydrocortisone 5-1 % CREA Apply 5 times/day for 5 days 5 g 2  . albuterol (VENTOLIN HFA) 108 (90 Base) MCG/ACT inhaler Inhale 1-2 puffs into the lungs every 6 (six) hours as needed for wheezing or shortness of breath. 18 g 5  . ibuprofen (ADVIL,MOTRIN) 200 MG tablet Take 400 mg by mouth every 6  (six) hours as needed.    Marland Kitchen lisinopril-hydrochlorothiazide (ZESTORETIC) 20-12.5 MG tablet TAKE 1 TABLET BY MOUTH DAILY. 90 tablet 3  . Multiple Vitamin (MULTIVITAMIN) tablet Take 1 tablet by mouth daily. Women's One-A-Day    . amphetamine-dextroamphetamine (ADDERALL XR) 20 MG 24 hr capsule Take 1 capsule (20 mg total) by mouth daily. 90 capsule 0   No current facility-administered medications for this visit.     Objective:  BP 126/82   Pulse 74   Temp (!) 97.2 F (36.2 C) (Temporal)   Ht 5\' 8"  (1.727 m)   Wt 239 lb (108.4 kg)   LMP  (LMP Unknown)   SpO2 98%   BMI 36.34 kg/m  Gen: NAD, resting comfortably No chest wall tenderness noted  EKG: sinus rhythm with rate 74 and 1 PVC, normal axis, normal intervals, no hypertrophy, no st or t wave changes     Assessment and Plan   Chest pain, atypical S:intermittent chest pain for several months. Feels like a jolt or electrical shock from left side of chest up to left jaw for a few seconds. Once or twice a day when occurs. Has not been exercising. Last time was just watching tv when occurred. One time occurred 3x and went to ER but line was really long and opted to go back home. Some left arm pain intermittently for a few months- not exerting herself much to know if  exertional for any of these-does tend to lay on that side. No shortness of breath with chest pain but has had some intermittent mild issues. No palpitations. Has occurred both on days and off days that she has taken adderall XR.   Occasional dizziness/lightheadedness- usually if sitting for a while and then stands up. Occasional headaches not new. Mainly happens at work. Not usually with chest pain. Not doing best on water intake.   No family history CAD. LDL slightly above ideal goal 70 or less at 74.   A/P: 50 year old female with atypical chest pain (not exertional but also has not exerted herself much, not relieved by rest, sharp from central chest up to left jaw for a few  seconds). Patient does have hypertension and mildly elevated lipids. In 2018 patient with atypical chest pain also- reassuring echocardiogram and exercise treadmill EKG testing. We offered referral back to cardiology- she declines for now. EKG largely reassuring today but discussed this does not rule out heart disease. Discussed trial off ADD meds but symptoms do not always occur when she is on ADD meds. She primarily wanted to monitor but after discussion of "different angle" evaluation with coronary CT she opted in for that evaluation)   Recommended follow up: Return in about 6 months (around 10/10/2020) for follow up- or sooner if needed. Future Appointments  Date Time Provider [place]  05/05/2020 10:30 AM LBCT-CT 1 LBCT-CT LB-CT [facility]  08/04/2020  7:30 AM GI-[facility]G MM 2 GI-BCGMM GI[facility]  10/10/2020  8:00 AM Lavonda Thal, Brayton Mars, MD [facility]   Lab/Order associations:   ICD-10-CM   1. Chest pain, unspecified type  R07.9 EKG 12-Lead    CT CARDIAC SCORING (SELF PAY ONLY)   Return precautions advised.  Garret Reddish, MD

## 2020-04-09 NOTE — Assessment & Plan Note (Signed)
S:intermittent chest pain for several months. Feels like a jolt or electrical shock from left side of chest up to left jaw for a few seconds. Once or twice a day when occurs. Has not been exercising. Last time was just watching tv when occurred. One time occurred 3x and went to ER but line was really long and opted to go back home. Some left arm pain intermittently for a few months- not exerting herself much to know if exertional for any of these-does tend to lay on that side. No shortness of breath with chest pain but has had some intermittent mild issues. No palpitations. Has occurred both on days and off days that she has taken adderall XR.   Occasional dizziness/lightheadedness- usually if sitting for a while and then stands up. Occasional headaches not new. Mainly happens at work. Not usually with chest pain. Not doing best on water intake.   No family history CAD. LDL slightly above ideal goal 70 or less at 74.   A/P: 50 year old female with atypical chest pain (not exertional but also has not exerted herself much, not relieved by rest, sharp from central chest up to left jaw for a few seconds). Patient does have hypertension and mildly elevated lipids. In 2018 patient with atypical chest pain also- reassuring echocardiogram and exercise treadmill EKG testing. We offered referral back to cardiology- she declines for now. EKG largely reassuring today but discussed this does not rule out heart disease. Discussed trial off ADD meds but symptoms do not always occur when she is on ADD meds. She primarily wanted to monitor but after discussion of "different angle" evaluation with coronary CT she opted in for that evaluation)

## 2020-04-10 LAB — URINE CYTOLOGY ANCILLARY ONLY
Chlamydia: NEGATIVE
Comment: NEGATIVE
Comment: NEGATIVE
Comment: NORMAL
Neisseria Gonorrhea: NEGATIVE
Trichomonas: NEGATIVE

## 2020-04-11 LAB — DRUG MONITORING, PANEL 8 WITH CONFIRMATION, URINE
6 Acetylmorphine: NEGATIVE ng/mL (ref <10)
Alcohol Metabolites: NEGATIVE ng/mL
Amphetamine: 2896 ng/mL — ABNORMAL HIGH (ref <250)
Amphetamines: POSITIVE ng/mL — AB (ref <500)
Benzodiazepines: NEGATIVE ng/mL (ref <100)
Buprenorphine, Urine: NEGATIVE ng/mL (ref <5)
Cocaine Metabolite: NEGATIVE ng/mL (ref <150)
Creatinine: 196 mg/dL
MDMA: NEGATIVE ng/mL (ref <500)
Marijuana Metabolite: NEGATIVE ng/mL (ref <20)
Methamphetamine: NEGATIVE ng/mL (ref <250)
Opiates: NEGATIVE ng/mL (ref <100)
Oxidant: NEGATIVE ug/mL
Oxycodone: NEGATIVE ng/mL (ref <100)
pH: 5.6 (ref 4.5–9.0)

## 2020-04-11 LAB — HEPATITIS C ANTIBODY
Hepatitis C Ab: NONREACTIVE
SIGNAL TO CUT-OFF: 0.01 (ref <1.00)

## 2020-04-11 LAB — RPR: RPR Ser Ql: NONREACTIVE

## 2020-04-11 LAB — DM TEMPLATE

## 2020-04-11 LAB — HIV ANTIBODY (ROUTINE TESTING W REFLEX): HIV 1&2 Ab, 4th Generation: NONREACTIVE

## 2020-04-14 ENCOUNTER — Encounter: Payer: Self-pay | Admitting: Internal Medicine

## 2020-04-18 ENCOUNTER — Other Ambulatory Visit: Payer: Self-pay | Admitting: Internal Medicine

## 2020-04-18 ENCOUNTER — Other Ambulatory Visit: Payer: Self-pay

## 2020-04-18 ENCOUNTER — Ambulatory Visit (AMBULATORY_SURGERY_CENTER): Payer: Self-pay

## 2020-04-18 VITALS — Ht 68.0 in | Wt 243.0 lb

## 2020-04-18 DIAGNOSIS — Z1211 Encounter for screening for malignant neoplasm of colon: Secondary | ICD-10-CM

## 2020-04-18 MED ORDER — NA SULFATE-K SULFATE-MG SULF 17.5-3.13-1.6 GM/177ML PO SOLN: 1.0000 | Freq: Once | ORAL | 0 refills | Status: DC

## 2020-04-18 MED FILL — SUPREP BOWEL PREP KIT: 17.5-3.13-1 | 1 days supply | Qty: 354 | Fill #0

## 2020-04-18 NOTE — Progress Notes (Signed)
No egg or soy allergy known to patient  No issues with past sedation with any surgeries or procedures No intubation problems in the past  No FH of Malignant Hyperthermia No diet pills per patient No home 02 use per patient  No blood thinners per patient  Pt denies issues with constipation  No A fib or A flutter  EMMI video via [place] 19 guidelines implemented in PV today with Pt and RN  Coupon given to pt in PV today, Code to Pharmacy and  NO PA's for preps discussed with pt In PV today  Discussed with pt there will be an out-of-pocket cost for prep and that varies from $0 to 70 dollars  Due to the COVID-19 pandemic we are asking patients to follow certain guidelines.   Pt aware of COVID protocols and [facility] guidelines

## 2020-05-02 ENCOUNTER — Encounter: Payer: Self-pay | Admitting: Internal Medicine

## 2020-05-02 ENCOUNTER — Other Ambulatory Visit: Payer: Self-pay

## 2020-05-02 ENCOUNTER — Ambulatory Visit (AMBULATORY_SURGERY_CENTER): Payer: No Typology Code available for payment source | Admitting: Internal Medicine

## 2020-05-02 VITALS — BP 126/83 | HR 66 | Temp 97.1°F | Resp 13 | Ht 68.0 in | Wt 243.0 lb

## 2020-05-02 DIAGNOSIS — Z1211 Encounter for screening for malignant neoplasm of colon: Secondary | ICD-10-CM | POA: Diagnosis not present

## 2020-05-02 DIAGNOSIS — D125 Benign neoplasm of sigmoid colon: Secondary | ICD-10-CM

## 2020-05-02 DIAGNOSIS — D120 Benign neoplasm of cecum: Secondary | ICD-10-CM

## 2020-05-02 DIAGNOSIS — K635 Polyp of colon: Secondary | ICD-10-CM

## 2020-05-02 DIAGNOSIS — D123 Benign neoplasm of transverse colon: Secondary | ICD-10-CM

## 2020-05-02 MED ORDER — SODIUM CHLORIDE 0.9 % IV SOLN: 500.0000 mL | Freq: Once | INTRAVENOUS | Status: DC

## 2020-05-02 NOTE — Progress Notes (Signed)
Medical history reviewed with no changes noted. VS assessed by C.W 

## 2020-05-02 NOTE — Op Note (Signed)
[place] Patient Name: Candice Parker Procedure Date: 05/02/2020 10:38 AM MRN: [id number] Endoscopist: Jerene Bears , MD Age: 50 Referring MD:  Date of Birth: 04-22-1970 Gender: Female Account #: [id number] Procedure:                Colonoscopy Indications:              Screening for colorectal malignant neoplasm, This                            is the patient's first colonoscopy Medicines:                Monitored Anesthesia Care Procedure:                Pre-Anesthesia Assessment:                           - Prior to the procedure, a History and Physical                            was performed, and patient medications and                            allergies were reviewed. The patient's tolerance of                            previous anesthesia was also reviewed. The risks                            and benefits of the procedure and the sedation                            options and risks were discussed with the patient.                            All questions were answered, and informed consent                            was obtained. Prior Anticoagulants: The patient has                            taken no previous anticoagulant or antiplatelet                            agents. ASA Grade Assessment: II - A patient with                            mild systemic disease. After reviewing the risks                            and benefits, the patient was deemed in                            satisfactory condition to undergo the procedure.  After obtaining informed consent, the colonoscope                            was passed under direct vision. Throughout the                            procedure, the patient's blood pressure, pulse, and                            oxygen saturations were monitored continuously. The                            Olympus CF-[id number]) Colonoscope was                            introduced through the anus and  advanced to the                            cecum, identified by appendiceal orifice and                            ileocecal valve. The colonoscopy was performed                            without difficulty. The patient tolerated the                            procedure well. The quality of the bowel                            preparation was good. The ileocecal valve,                            appendiceal orifice, and rectum were photographed. Scope In: 11:00:24 AM Scope Out: 11:20:26 AM Scope Withdrawal Time: 0 hours 17 minutes 36 seconds  Total Procedure Duration: 0 hours 20 minutes 2 seconds  Findings:                 The digital rectal exam was normal.                           Two sessile polyps were found in the cecum. The                            polyps were 5 to 8 mm in size. These polyps were                            removed with a cold snare. Resection and retrieval                            were complete.                           Two sessile polyps were found in the transverse  colon. The polyps were 5 to 7 mm in size. These                            polyps were removed with a cold snare. Resection                            and retrieval were complete.                           A 6 mm polyp was found in the distal sigmoid colon.                            The polyp was sessile. The polyp was removed with a                            cold snare. Resection and retrieval were complete.                           Multiple small and large-mouthed diverticula were                            found in the sigmoid colon, descending colon and                            ascending colon.                           Internal hemorrhoids were found during                            retroflexion. The hemorrhoids were small. Complications:            No immediate complications. Estimated Blood Loss:     Estimated blood loss was minimal. Impression:                - Two 5 to 8 mm polyps in the cecum, removed with a                            cold snare. Resected and retrieved.                           - Two 5 to 7 mm polyps in the transverse colon,                            removed with a cold snare. Resected and retrieved.                           - One 6 mm polyp in the distal sigmoid colon,                            removed with a cold snare. Resected and retrieved.                           -  Diverticulosis in the sigmoid colon, in the                            descending colon and in the ascending colon.                           - Internal hemorrhoids. Recommendation:           - Patient has a contact number available for                            emergencies. The signs and symptoms of potential                            delayed complications were discussed with the                            patient. Return to normal activities tomorrow.                            Written discharge instructions were provided to the                            patient.                           - Resume previous diet.                           - Continue present medications.                           - Await pathology results.                           - Repeat colonoscopy is recommended. The                            colonoscopy date will be determined after pathology                            results from today's exam become available for                            review. Jerene Bears, MD 05/02/2020 11:24:44 AM This report has been signed electronically.

## 2020-05-02 NOTE — Progress Notes (Signed)
PT taken to PACU. Monitors in place. VSS. Report given to RN. 

## 2020-05-02 NOTE — Progress Notes (Signed)
Called to room to assist during endoscopic procedure.  Patient ID and intended procedure confirmed with present staff. Received instructions for my participation in the procedure from the performing physician.  

## 2020-05-02 NOTE — Patient Instructions (Signed)
Handouts :  Hemorrhoids, Diverticulosis, Polyps Resume previous diet Continue current medications Await pathology results  YOU HAD AN ENDOSCOPIC PROCEDURE TODAY AT [facility]:   Refer to the procedure report that was given to you for any specific questions about what was found during the examination.  If the procedure report does not answer your questions, please call your gastroenterologist to clarify.  If you requested that your care partner not be given the details of your procedure findings, then the procedure report has been included in a sealed envelope for you to review at your convenience later.  YOU SHOULD EXPECT: Some feelings of bloating in the abdomen. Passage of more gas than usual.  Walking can help get rid of the air that was put into your GI tract during the procedure and reduce the bloating. If you had a lower endoscopy (such as a colonoscopy or flexible sigmoidoscopy) you may notice spotting of blood in your stool or on the toilet paper. If you underwent a bowel prep for your procedure, you may not have a normal bowel movement for a few days.  Please Note:  You might notice some irritation and congestion in your nose or some drainage.  This is from the oxygen used during your procedure.  There is no need for concern and it should clear up in a day or so.  SYMPTOMS TO REPORT IMMEDIATELY:   Following lower endoscopy (colonoscopy or flexible sigmoidoscopy):  Excessive amounts of blood in the stool  Significant tenderness or worsening of abdominal pains  Swelling of the abdomen that is new, acute  Fever of 100F or higher  For urgent or emergent issues, a gastroenterologist can be reached at any hour by calling [phone]. Do not use MyChart messaging for urgent concerns.   DIET:  We do recommend a small meal at first, but then you may proceed to your regular diet.  Drink plenty of fluids but you should avoid alcoholic beverages for 24 hours.  ACTIVITY:   You should plan to take it easy for the rest of today and you should NOT DRIVE or use heavy machinery until tomorrow (because of the sedation medicines used during the test).    FOLLOW UP: Our staff will call the number listed on your records 48-72 hours following your procedure to check on you and address any questions or concerns that you may have regarding the information given to you following your procedure. If we do not reach you, we will leave a message.  We will attempt to reach you two times.  During this call, we will ask if you have developed any symptoms of COVID 19. If you develop any symptoms (ie: fever, flu-like symptoms, shortness of breath, cough etc.) before then, please call [phone].  If you test positive for Covid 19 in the 2 weeks post procedure, please call and report this information to [place].    If any biopsies were taken you will be contacted by phone or by letter within the next 1-3 weeks.  Please call us at [phone] if you have not heard about the biopsies in 3 weeks.   SIGNATURES/CONFIDENTIALITY: You and/or your care partner have signed paperwork which will be entered into your electronic medical record.  These signatures attest to the fact that that the information above on your After Visit Summary has been reviewed and is understood.  Full responsibility of the confidentiality of this discharge information lies with you and/or your care-partner.

## 2020-05-05 ENCOUNTER — Other Ambulatory Visit: Payer: Self-pay

## 2020-05-05 ENCOUNTER — Ambulatory Visit (INDEPENDENT_AMBULATORY_CARE_PROVIDER_SITE_OTHER)
Admission: RE | Admit: 2020-05-05 | Discharge: 2020-05-05 | Disposition: A | Discharge status: Home / Self Care | Payer: Self-pay | Source: Ambulatory Visit | Attending: Family Medicine | Admitting: Family Medicine

## 2020-05-05 DIAGNOSIS — R079 Chest pain, unspecified: Secondary | ICD-10-CM

## 2020-05-06 ENCOUNTER — Telehealth: Payer: Self-pay | Admitting: *Deleted

## 2020-05-06 NOTE — Telephone Encounter (Signed)
  Follow up Call-  Call back number 05/02/2020  Post procedure Call Back phone  # [phone]  Permission to leave phone message Yes  Some recent data might be hidden     Patient questions:  Do you have a fever, pain , or abdominal swelling? No. Pain Score  0 *  Have you tolerated food without any problems? Yes.    Have you been able to return to your normal activities? Yes.    Do you have any questions about your discharge instructions: Diet   No. Medications  No. Follow up visit  No.  Do you have questions or concerns about your Care? No.  Actions: * If pain score is 4 or above: No action needed, pain <4.

## 2020-05-09 ENCOUNTER — Encounter: Payer: Self-pay | Admitting: Family Medicine

## 2020-05-13 ENCOUNTER — Other Ambulatory Visit: Payer: Self-pay | Admitting: Family Medicine

## 2020-05-15 ENCOUNTER — Encounter: Payer: Self-pay | Admitting: Internal Medicine

## 2020-06-09 ENCOUNTER — Other Ambulatory Visit: Payer: Self-pay | Admitting: Family Medicine

## 2020-06-09 ENCOUNTER — Other Ambulatory Visit (HOSPITAL_COMMUNITY): Payer: Self-pay

## 2020-06-09 MED ORDER — LISINOPRIL-HYDROCHLOROTHIAZIDE 20-12.5 MG PO TABS
1.0000 | ORAL_TABLET | Freq: Every day | ORAL | 3 refills | Status: DC
  Filled 2020-06-09: qty 90, 90d supply, fill #0
  Filled 2020-10-30: qty 90, 90d supply, fill #1
  Filled 2021-03-02: qty 90, 90d supply, fill #2
  Filled 2021-06-08: qty 90, 90d supply, fill #3

## 2020-06-09 MED FILL — Amphetamine-Dextroamphetamine Cap ER 24HR 20 MG: ORAL | 90 days supply | Qty: 90 | Fill #0 | Status: AC

## 2020-06-11 ENCOUNTER — Other Ambulatory Visit (HOSPITAL_COMMUNITY): Payer: Self-pay

## 2020-06-20 ENCOUNTER — Other Ambulatory Visit: Payer: Self-pay | Admitting: Family Medicine

## 2020-06-20 DIAGNOSIS — Z1231 Encounter for screening mammogram for malignant neoplasm of breast: Secondary | ICD-10-CM

## 2020-08-04 DIAGNOSIS — Z1231 Encounter for screening mammogram for malignant neoplasm of breast: Secondary | ICD-10-CM

## 2020-08-20 ENCOUNTER — Other Ambulatory Visit: Payer: Self-pay

## 2020-08-20 ENCOUNTER — Ambulatory Visit
Admission: RE | Admit: 2020-08-20 | Discharge: 2020-08-20 | Disposition: A | Discharge status: Home / Self Care | Payer: No Typology Code available for payment source | Source: Ambulatory Visit

## 2020-08-20 DIAGNOSIS — Z1231 Encounter for screening mammogram for malignant neoplasm of breast: Secondary | ICD-10-CM

## 2020-09-17 ENCOUNTER — Other Ambulatory Visit (HOSPITAL_COMMUNITY): Payer: Self-pay

## 2020-09-17 MED ORDER — CARESTART COVID-19 HOME TEST VI KIT
PACK | 0 refills | Status: DC
  Filled 2020-09-17: qty 4, 4d supply, fill #0

## 2020-10-09 NOTE — Progress Notes (Signed)
Phone [phone] In person visit   Subjective:   Candice Parker is a 50 y.o. year old very pleasant female patient who presents for/with See problem oriented charting Chief Complaint  Patient presents with   Hair/Scalp Problem    Hair thinning, ongoing for awhile.   ADD    This visit occurred during the SARS-CoV-2 public health emergency.  Safety protocols were in place, including screening questions prior to the visit, additional usage of staff PPE, and extensive cleaning of exam room while observing appropriate contact time as indicated for disinfecting solutions.   Past Medical History-  Patient Active Problem List   Diagnosis Date Noted   Attention deficit disorder (ADD) in adult 06/13/2014    Priority: High   Left knee pain 06/13/2014    Priority: Medium   HTN (hypertension) 07/17/2010    Priority: Medium   Stress headache 07/18/2017    Priority: Low   Left elbow pain 06/13/2014    Priority: Low   Herpes simplex 06/13/2014    Priority: Low   Chest pain, atypical 04/11/2011    Priority: Low   Intermittent memory loss 02/18/2011    Priority: Low   Obesity 12/10/2010    Priority: Low   Asthma 01/29/2009    Priority: Low    Medications- reviewed and updated Current Outpatient Medications  Medication Sig Dispense Refill   acyclovir (ZOVIRAX) 400 MG tablet Take 1 tablet (400 mg total) by mouth 5 (five) times daily. For 5 days- take at first sign of cold sore 30 tablet 2   acyclovir ointment (ZOVIRAX) 5 % Apply 1 application topically every 3 (three) hours. 30 g 1   Acyclovir-Hydrocortisone 5-1 % CREA Apply 5 times/day for 5 days 5 g 2   ADDERALL XR 20 MG 24 hr capsule Take 1 capsule (20 mg total) by mouth daily. 90 capsule 0   ibuprofen (ADVIL,MOTRIN) 200 MG tablet Take 400 mg by mouth every 6 (six) hours as needed.     lisinopril-hydrochlorothiazide (ZESTORETIC) 20-12.5 MG tablet TAKE 1 TABLET BY MOUTH DAILY. 90 tablet 3   LORATADINE PO Take 1 tablet by mouth as  needed.     Multiple Vitamin (MULTIVITAMIN) tablet Take 1 tablet by mouth daily. Women's One-A-Day     albuterol (VENTOLIN HFA) 108 (90 Base) MCG/ACT inhaler Inhale 1-2 puffs into the lungs every 6 (six) hours as needed for wheezing or shortness of breath. 18 g 5   No current facility-administered medications for this visit.     Objective:  BP 130/82   Pulse 81   Temp 97.9 F (36.6 C)   Ht '5\' 8"'$  (1.727 m)   Wt 236 lb 12.8 oz (107.4 kg)   LMP  (LMP Unknown)   SpO2 97%   BMI 36.01 kg/m  Gen: NAD, resting comfortably CV: RRR no murmurs rubs or gallops Lungs: CTAB no crackles, wheeze, rhonchi Ext: no edema Skin: warm, dry     Assessment and Plan   #social update- 3 classes this semester at [employer] to graduate winter 2023  #chest pain last visit- reassuring coronary CT score of 0 last visit- good for 5-10 years  #weight loss- down 7 lbs from last visit. Lost 5 of this with covid. Trying to increase water  #Hair loss S: Patient reports hair loss issues for a few years- worse in last 6 months- seems to be worse at top of the head.  When I reviewed my notes she mention hair thinning 08/29/2018. Lab Results  Component Value  Date   TSH 1.15 05/06/2017  A/P: Patient with hair loss for several years that has been worsening more recently over the last 6 months.  We opted to check a TSH and refer her to Dr[facility] Leonie Green of [facility] for her expertise  # Attention Deficit Disorder in Adult S:compliant with Adderall XR 20 mg daily. Controlling symptoms reasonably well-primarily takes this during the week when school is out- was using less when school was out - helps her manage both school at [employer] and work- back to daily - controlled substance contract 07/18/17  -UDS 04/09/2020 - original diagnosis by Dr. Cheryln Manly of [facility] A/P: well controlled- continue current medicines   #hypertension S: compliant with Lisinopril-HTCZ 20-12.5 mg.  BP Readings from Last 3  Encounters:  10/14/20 130/82  05/02/20 126/83  04/09/20 126/82  A/P: Well-controlled-continue current medication  # Asthma S:Using Albuterol HFA prn. No recent issues but did need a refill today.  Currently taking once a month  - had covid first of august- had to use slightly more then A/P: stable/controlled- continue current meds  #Vision issues- has been seen by Dr. Katy Fitch in the past. Having more issues up close and transitioning from computer screen to then looking further out.  At rest without needing to focus no significant issues. No double vision.  -refer back to opth o Dr. Katy Fitch  Recommended follow up: Return in about 6 months (around 04/13/2021) for a physical or sooner if needed.  Lab/Order associations: protein shake   ICD-10-CM   1. Essential hypertension  I10     2. Attention deficit disorder (ADD) in adult  F98.8     3. Mild intermittent asthma without complication  A999333     4. Herpes simplex  B00.9       Meds ordered this encounter  Medications   albuterol (VENTOLIN HFA) 108 (90 Base) MCG/ACT inhaler    Sig: Inhale 1-2 puffs into the lungs every 6 (six) hours as needed for wheezing or shortness of breath.    Dispense:  18 g    Refill:  5    I,Harris Phan,acting as a scribe for Garret Reddish, MD.,have documented all relevant documentation on the behalf of Garret Reddish, MD,as directed by  Garret Reddish, MD while in the presence of Garret Reddish, MD.   I, Garret Reddish, MD, have reviewed all documentation for this visit. The documentation on 10/14/20 for the exam, diagnosis, procedures, and orders are all accurate and complete.   Return precautions advised.  Garret Reddish, MD

## 2020-10-10 ENCOUNTER — Ambulatory Visit: Payer: No Typology Code available for payment source | Admitting: Family Medicine

## 2020-10-14 ENCOUNTER — Ambulatory Visit (INDEPENDENT_AMBULATORY_CARE_PROVIDER_SITE_OTHER): Payer: No Typology Code available for payment source | Admitting: Family Medicine

## 2020-10-14 ENCOUNTER — Encounter: Payer: Self-pay | Admitting: Family Medicine

## 2020-10-14 ENCOUNTER — Other Ambulatory Visit (HOSPITAL_COMMUNITY): Payer: Self-pay

## 2020-10-14 ENCOUNTER — Other Ambulatory Visit: Payer: Self-pay

## 2020-10-14 VITALS — BP 130/82 | HR 81 | Temp 97.9°F | Ht 68.0 in | Wt 236.8 lb

## 2020-10-14 DIAGNOSIS — I10 Essential (primary) hypertension: Secondary | ICD-10-CM

## 2020-10-14 DIAGNOSIS — H538 Other visual disturbances: Secondary | ICD-10-CM

## 2020-10-14 DIAGNOSIS — L659 Nonscarring hair loss, unspecified: Secondary | ICD-10-CM | POA: Diagnosis not present

## 2020-10-14 DIAGNOSIS — B009 Herpesviral infection, unspecified: Secondary | ICD-10-CM

## 2020-10-14 DIAGNOSIS — F988 Other specified behavioral and emotional disorders with onset usually occurring in childhood and adolescence: Secondary | ICD-10-CM | POA: Diagnosis not present

## 2020-10-14 DIAGNOSIS — J4520 Mild intermittent asthma, uncomplicated: Secondary | ICD-10-CM

## 2020-10-14 LAB — COMPREHENSIVE METABOLIC PANEL
ALT: 11 U/L (ref 0–35)
AST: 14 U/L (ref 0–37)
Albumin: 3.7 g/dL (ref 3.5–5.2)
Alkaline Phosphatase: 54 U/L (ref 39–117)
BUN: 15 mg/dL (ref 6–23)
CO2: 24 mEq/L (ref 19–32)
Calcium: 9 mg/dL (ref 8.4–10.5)
Chloride: 105 mEq/L (ref 96–112)
Creatinine, Ser: 0.82 mg/dL (ref 0.40–1.20)
GFR: 84.68 mL/min (ref >60.00)
Glucose, Bld: 97 mg/dL (ref 70–99)
Potassium: 3.6 mEq/L (ref 3.5–5.1)
Sodium: 137 mEq/L (ref 135–145)
Total Bilirubin: 0.7 mg/dL (ref 0.2–1.2)
Total Protein: 7.2 g/dL (ref 6.0–8.3)

## 2020-10-14 LAB — TSH: TSH: 1.49 u[IU]/mL (ref 0.35–5.50)

## 2020-10-14 MED ORDER — ALBUTEROL SULFATE HFA 108 (90 BASE) MCG/ACT IN AERS
1.0000 | INHALATION_SPRAY | Freq: Four times a day (QID) | RESPIRATORY_TRACT | 5 refills | Status: DC | PRN
  Filled 2020-10-14: qty 18, 25d supply, fill #0
  Filled 2021-08-21: qty 18, 25d supply, fill #1

## 2020-10-14 MED ORDER — ADDERALL XR 20 MG PO CP24
20.0000 mg | ORAL_CAPSULE | Freq: Every day | ORAL | 0 refills | Status: DC
  Filled 2020-10-14: qty 90, 90d supply, fill #0

## 2020-10-14 NOTE — Patient Instructions (Addendum)
Health Maintenance Due  Topic Date Due   INFLUENZA VACCINE - Please send [place] a message through MyChart when you have received this through work. 09/08/2020   Please stop by lab before you go If you have mychart- we will send your results within 3 business days of [place] receiving them.  If you do not have mychart- we will call you about results within 5 business days of [place] receiving them.  *please also note that you will see labs on mychart as soon as they post. I will later go in and write notes on them- will say "notes from Dr. Yong Channel"  I want to congratulate you on your weight loss!  Thank you for getting your colonoscopy done!  We will call you within two weeks about your referral to Dermatology - Dr. Leonie Green. If you do not hear within 2 weeks, give [place] a call.   We will call you within two weeks about your referral to Ophthalmology- Dr. Katy Fitch. If you do not hear within 2 weeks, give [place] a call.   Recommended follow up: Return in about 6 months (around 04/13/2021) for a physical or sooner if needed.

## 2020-10-28 ENCOUNTER — Encounter: Payer: Self-pay | Admitting: Family Medicine

## 2020-10-30 ENCOUNTER — Other Ambulatory Visit (HOSPITAL_COMMUNITY): Payer: Self-pay

## 2021-03-02 ENCOUNTER — Other Ambulatory Visit (HOSPITAL_COMMUNITY): Payer: Self-pay

## 2021-03-03 ENCOUNTER — Other Ambulatory Visit: Payer: Self-pay | Admitting: Family Medicine

## 2021-03-04 ENCOUNTER — Other Ambulatory Visit (HOSPITAL_COMMUNITY): Payer: Self-pay

## 2021-03-05 ENCOUNTER — Other Ambulatory Visit (HOSPITAL_COMMUNITY): Payer: Self-pay

## 2021-03-05 ENCOUNTER — Other Ambulatory Visit: Payer: Self-pay | Admitting: Family Medicine

## 2021-03-05 MED ORDER — ADDERALL XR 20 MG PO CP24
20.0000 mg | ORAL_CAPSULE | Freq: Every day | ORAL | 0 refills | Status: DC
  Filled 2021-03-05: qty 90, 90d supply, fill #0

## 2021-03-06 ENCOUNTER — Other Ambulatory Visit (HOSPITAL_COMMUNITY): Payer: Self-pay

## 2021-04-13 NOTE — Progress Notes (Incomplete)
. Phone [phone]   Subjective:  Patient presents today for their annual physical. Chief complaint-noted.   See problem oriented charting- ROS- full  review of systems was completed and negative except for: ***  The following were reviewed and entered/updated in epic: Past Medical History:  Diagnosis Date   Acid reflux 12/10/2010   hx of-diet controlled   Allergy    sesaonal allergies   ASTHMA, INTRINSIC 01/29/2009   uses inhaler PRN   Breast lesion 09/29/2011   Hypertension    on meds   Overweight(278.02) 12/10/2010   Patient Active Problem List   Diagnosis Date Noted   Stress headache 07/18/2017   Attention deficit disorder (ADD) in adult 06/13/2014   Left elbow pain 06/13/2014   Herpes simplex 06/13/2014   Left knee pain 06/13/2014   Chest pain, atypical 04/11/2011   Intermittent memory loss 02/18/2011   Obesity 12/10/2010   HTN (hypertension) 07/17/2010   Asthma 01/29/2009   Past Surgical History:  Procedure Laterality Date   ELBOW FRACTURE SURGERY Left    INNER EAR SURGERY Left    TOTAL ABDOMINAL HYSTERECTOMY     fibroid uterus.    Family History  Problem Relation Age of Onset   Hypertension Mother    Hyperlipidemia Mother    Heart failure Father        drug induced   Kidney disease Father        failure   Breast cancer Paternal Grandmother    Breast cancer Maternal Grandmother    Hypertension Brother    Colon polyps Neg Hx    Colon cancer Neg Hx    Esophageal cancer Neg Hx    Rectal cancer Neg Hx    Stomach cancer Neg Hx     Medications- reviewed and updated Current Outpatient Medications  Medication Sig Dispense Refill   acyclovir (ZOVIRAX) 400 MG tablet Take 1 tablet (400 mg total) by mouth 5 (five) times daily. For 5 days- take at first sign of cold sore 30 tablet 2   acyclovir ointment (ZOVIRAX) 5 % Apply 1 application topically every 3 (three) hours. 30 g 1   Acyclovir-Hydrocortisone 5-1 % CREA Apply 5 times/day for 5 days 5 g 2    ADDERALL XR 20 MG 24 hr capsule Take 1 capsule (20 mg total) by mouth daily. 90 capsule 0   albuterol (VENTOLIN HFA) 108 (90 Base) MCG/ACT inhaler Inhale 1-2 puffs into the lungs every 6 (six) hours as needed for wheezing or shortness of breath. 18 g 5   ibuprofen (ADVIL,MOTRIN) 200 MG tablet Take 400 mg by mouth every 6 (six) hours as needed.     lisinopril-hydrochlorothiazide (ZESTORETIC) 20-12.5 MG tablet TAKE 1 TABLET BY MOUTH DAILY. 90 tablet 3   LORATADINE PO Take 1 tablet by mouth as needed.     Multiple Vitamin (MULTIVITAMIN) tablet Take 1 tablet by mouth daily. Women's One-A-Day     No current facility-administered medications for this visit.    Allergies-reviewed and updated No Known Allergies  Social History   Social History Narrative   Single. 54 year son in school at [employer] in 2018- doing [employer] type work still wants to play basketball overseas.       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 Oropharynx  normal Neck: no thyromegaly CV: RRR no murmurs rubs or gallops Lungs: CTAB no crackles, wheeze, rhonchi Abdomen: soft/nontender/nondistended/normal bowel sounds. No rebound or guarding.  Ext: no edema Skin: warm, dry Neuro: grossly normal, moves all extremities, PERRLA***   Assessment and Plan   51 y.o. female presenting for annual physical.  Health Maintenance counseling: 1. Anticipatory guidance: Patient counseled regarding regular dental exams ***q6 months, eye exams-no issues ***,  avoiding smoking and second hand smoke*** , limiting alcohol to 1 beverage per day*** .  No illicit drugs - *** 2. Risk factor reduction:  Advised patient of need for regular exercise and diet rich and fruits and vegetables to reduce risk of heart attack and stroke.  Exercise-  back working with personal trainer but shes going to be moving to [place] (her trainer)- may use her online option.***. Diet--loose over the holidays- had 10 days off and enjoyed eating- she is getting back on track after gaining a few lbs . ***.  Wt Readings from Last 3 Encounters:  10/14/20 236 lb 12.8 oz (107.4 kg)  05/02/20 243 lb (110.2 kg)  04/18/20 243 lb (110.2 kg)   3. Immunizations/screenings/ancillary studies DISCUSSED:  -Flu vaccination - *** Immunization History  Administered Date(s) Administered   Influenza Whole 11/09/2018   Influenza-Unspecified 11/21/2013, 11/23/2014, 11/24/2015, 11/19/2016, 11/09/2019   Moderna Sars-Covid-2 Vaccination 03/06/2019, 10/21/2019   Td 10/02/2009   Tdap 06/12/2015   Health Maintenance Due  Topic Date Due   INFLUENZA VACCINE  09/08/2020   4. Cervical cancer screening-- not candidate as had total hysterectomy for fibroids including cervix *** 5. Breast cancer screening- breast exam declined (preferred self exams only) *** and mammogram - scheduled for may 20th 2020 *** 6. Colon cancer screening - 05/02/20 with 3 year repeat planned*** 7. Skin cancer screening- ***advised regular sunscreen use. Denies worrisome, changing, or new skin lesions.  8. Birth control/STD check- hysterectomy. Not dating- did have a one time episode unprotected sex with ex husband. Will do STD testing today. No current disharge- had seen urgent care*** 9. Osteoporosis screening at 89- will plan on this*** -never smoker  Status of chronic or acute concerns   #History adenomatous polyp on 05/02/2020 with 3-year repeat planned***  # Attention Deficit Disorder in Adult S:compliant with Adderall XR 20 mg daily. Controlling symptoms *** - helped her manage both school at [employer] and work*** - mainly takes during the week - controlled substance contract 07/18/17  -UDS 04/09/2020 - original diagnosis by Dr. Cheryln Manly of [facility] A/P: ***    #hypertension S: compliant with Lisinopril-HTCZ 20-12.5 mg.  Home readings #s: *** BP Readings from Last 3 Encounters:  10/14/20 130/82  05/02/20 126/83  04/09/20 126/82  A/P: ***  # Asthma S:Using Albuterol HFA prn. No recent issues  A/P: ***  # Herpes Simplex S:Using Acyclovir or Acyclovir-Hydrocortisone cream prn for cold sores.  A/P: ***   Recommended follow up: No follow-ups on file. Future Appointments  Date Time Provider [place]  05/04/2021  9:20 AM Marin Olp, MD [facility]    No chief complaint on file.  Lab/Order associations:*** fasting No diagnosis found.  No orders of the defined types were placed in this encounter.  I,Jada Bradford,acting as a scribe for Garret Reddish, MD.,have documented all relevant documentation on the behalf of Garret Reddish, MD,as directed by  Garret Reddish, MD while in the presence of Garret Reddish, MD.  ***  Return precautions advised.  Burnett Corrente

## 2021-05-04 ENCOUNTER — Other Ambulatory Visit (HOSPITAL_COMMUNITY)
Admission: RE | Admit: 2021-05-04 | Discharge: 2021-05-04 | Disposition: A | Discharge status: Home / Self Care | Payer: No Typology Code available for payment source | Source: Ambulatory Visit | Attending: Family Medicine | Admitting: Family Medicine

## 2021-05-04 ENCOUNTER — Encounter: Payer: Self-pay | Admitting: Family Medicine

## 2021-05-04 ENCOUNTER — Other Ambulatory Visit (HOSPITAL_COMMUNITY): Payer: Self-pay

## 2021-05-04 ENCOUNTER — Ambulatory Visit (INDEPENDENT_AMBULATORY_CARE_PROVIDER_SITE_OTHER): Payer: No Typology Code available for payment source | Admitting: Family Medicine

## 2021-05-04 VITALS — BP 114/88 | HR 82 | Temp 97.9°F | Ht 68.0 in | Wt 243.2 lb

## 2021-05-04 DIAGNOSIS — E785 Hyperlipidemia, unspecified: Secondary | ICD-10-CM

## 2021-05-04 DIAGNOSIS — Z118 Encounter for screening for other infectious and parasitic diseases: Secondary | ICD-10-CM | POA: Insufficient documentation

## 2021-05-04 DIAGNOSIS — F988 Other specified behavioral and emotional disorders with onset usually occurring in childhood and adolescence: Secondary | ICD-10-CM | POA: Diagnosis not present

## 2021-05-04 DIAGNOSIS — Z114 Encounter for screening for human immunodeficiency virus [HIV]: Secondary | ICD-10-CM

## 2021-05-04 DIAGNOSIS — I10 Essential (primary) hypertension: Secondary | ICD-10-CM

## 2021-05-04 DIAGNOSIS — Z113 Encounter for screening for infections with a predominantly sexual mode of transmission: Secondary | ICD-10-CM | POA: Insufficient documentation

## 2021-05-04 DIAGNOSIS — Z79899 Other long term (current) drug therapy: Secondary | ICD-10-CM

## 2021-05-04 DIAGNOSIS — J4520 Mild intermittent asthma, uncomplicated: Secondary | ICD-10-CM

## 2021-05-04 DIAGNOSIS — Z0000 Encounter for general adult medical examination without abnormal findings: Secondary | ICD-10-CM

## 2021-05-04 LAB — CBC WITH DIFFERENTIAL/PLATELET
Basophils Absolute: 0 10*3/uL (ref 0.0–0.1)
Basophils Relative: 0.6 % (ref 0.0–3.0)
Eosinophils Absolute: 0.2 10*3/uL (ref 0.0–0.7)
Eosinophils Relative: 3.7 % (ref 0.0–5.0)
HCT: 37.1 % (ref 36.0–46.0)
Hemoglobin: 12.7 g/dL (ref 12.0–15.0)
Lymphocytes Relative: 48.6 % — ABNORMAL HIGH (ref 12.0–46.0)
Lymphs Abs: 2.4 10*3/uL (ref 0.7–4.0)
MCHC: 34.3 g/dL (ref 30.0–36.0)
MCV: 87.8 fl (ref 78.0–100.0)
Monocytes Absolute: 0.5 10*3/uL (ref 0.1–1.0)
Monocytes Relative: 10.1 % (ref 3.0–12.0)
Neutro Abs: 1.8 10*3/uL (ref 1.4–7.7)
Neutrophils Relative %: 37 % — ABNORMAL LOW (ref 43.0–77.0)
Platelets: 275 10*3/uL (ref 150.0–400.0)
RBC: 4.23 Mil/uL (ref 3.87–5.11)
RDW: 12.8 % (ref 11.5–15.5)
WBC: 5 10*3/uL (ref 4.0–10.5)

## 2021-05-04 LAB — LIPID PANEL
Cholesterol: 150 mg/dL (ref 0–200)
HDL: 50.2 mg/dL (ref >39.00)
LDL Cholesterol: 81 mg/dL (ref 0–99)
NonHDL: 99.95
Total CHOL/HDL Ratio: 3
Triglycerides: 97 mg/dL (ref 0.0–149.0)
VLDL: 19.4 mg/dL (ref 0.0–40.0)

## 2021-05-04 LAB — COMPREHENSIVE METABOLIC PANEL
ALT: 23 U/L (ref 0–35)
AST: 24 U/L (ref 0–37)
Albumin: 4.1 g/dL (ref 3.5–5.2)
Alkaline Phosphatase: 68 U/L (ref 39–117)
BUN: 11 mg/dL (ref 6–23)
CO2: 30 mEq/L (ref 19–32)
Calcium: 9.4 mg/dL (ref 8.4–10.5)
Chloride: 103 mEq/L (ref 96–112)
Creatinine, Ser: 0.92 mg/dL (ref 0.40–1.20)
GFR: 73.48 mL/min (ref >60.00)
Glucose, Bld: 102 mg/dL — ABNORMAL HIGH (ref 70–99)
Potassium: 3.4 mEq/L — ABNORMAL LOW (ref 3.5–5.1)
Sodium: 141 mEq/L (ref 135–145)
Total Bilirubin: 0.6 mg/dL (ref 0.2–1.2)
Total Protein: 7.7 g/dL (ref 6.0–8.3)

## 2021-05-04 MED ORDER — AMPHETAMINE-DEXTROAMPHET ER 20 MG PO CP24
20.0000 mg | ORAL_CAPSULE | ORAL | 0 refills | Status: DC
  Filled 2021-08-21: qty 30, 30d supply, fill #0

## 2021-05-04 MED ORDER — ACYCLOVIR 400 MG PO TABS
400.0000 mg | ORAL_TABLET | Freq: Every day | ORAL | 2 refills | Status: DC
  Filled 2021-05-04: qty 30, 6d supply, fill #0
  Filled 2021-08-21: qty 30, 6d supply, fill #1

## 2021-05-04 MED ORDER — AMPHETAMINE-DEXTROAMPHET ER 20 MG PO CP24
20.0000 mg | ORAL_CAPSULE | ORAL | 0 refills | Status: DC
Start: 2021-05-04 — End: 2021-08-21
  Filled 2021-05-04 – 2021-06-08 (×2): qty 30, 30d supply, fill #0

## 2021-05-04 MED ORDER — AMPHETAMINE-DEXTROAMPHET ER 20 MG PO CP24
20.0000 mg | ORAL_CAPSULE | ORAL | 0 refills | Status: DC
Start: 2021-06-03 — End: 2021-11-24
  Filled 2021-08-21: qty 30, 30d supply, fill #0

## 2021-05-04 NOTE — Patient Instructions (Addendum)
Please stop by lab before you go ?If you have mychart- we will send your results within 3 business days of [place] receiving them.  ?If you do not have mychart- we will call you about results within 5 business days of [place] receiving them.  ?*please also note that you will see labs on mychart as soon as they post. I will later go in and write notes on them- will say "notes from Dr. Yong Channel"  ? ?Recommended follow up: Return in about 6 months (around 11/04/2021) for followup or sooner if needed.Schedule b4 you leave. ?

## 2021-05-05 LAB — URINE CYTOLOGY ANCILLARY ONLY
Chlamydia: NEGATIVE
Comment: NEGATIVE
Comment: NEGATIVE
Comment: NORMAL
Neisseria Gonorrhea: NEGATIVE
Trichomonas: NEGATIVE

## 2021-05-06 LAB — DRUG MONITORING, PANEL 8 WITH CONFIRMATION, URINE
6 Acetylmorphine: NEGATIVE ng/mL (ref <10)
Alcohol Metabolites: NEGATIVE ng/mL (ref <500)
Amphetamine: 5138 ng/mL — ABNORMAL HIGH (ref <250)
Amphetamines: POSITIVE ng/mL — AB (ref <500)
Benzodiazepines: NEGATIVE ng/mL (ref <100)
Buprenorphine, Urine: NEGATIVE ng/mL (ref <5)
Cocaine Metabolite: NEGATIVE ng/mL (ref <150)
Creatinine: 79.6 mg/dL (ref >=20.0)
MDMA: NEGATIVE ng/mL (ref <500)
Marijuana Metabolite: NEGATIVE ng/mL (ref <20)
Methamphetamine: NEGATIVE ng/mL (ref <250)
Opiates: NEGATIVE ng/mL (ref <100)
Oxidant: NEGATIVE ug/mL (ref <200)
Oxycodone: NEGATIVE ng/mL (ref <100)
pH: 6.9 (ref 4.5–9.0)

## 2021-05-06 LAB — RPR: RPR Ser Ql: NONREACTIVE

## 2021-05-06 LAB — HIV ANTIBODY (ROUTINE TESTING W REFLEX): HIV 1&2 Ab, 4th Generation: NONREACTIVE

## 2021-05-06 LAB — DM TEMPLATE

## 2021-06-08 ENCOUNTER — Other Ambulatory Visit (HOSPITAL_COMMUNITY): Payer: Self-pay

## 2021-07-14 ENCOUNTER — Other Ambulatory Visit: Payer: Self-pay | Admitting: Family Medicine

## 2021-07-14 DIAGNOSIS — Z1231 Encounter for screening mammogram for malignant neoplasm of breast: Secondary | ICD-10-CM

## 2021-08-10 IMAGING — MG MM DIGITAL SCREENING BILAT W/ TOMO AND CAD
8 series · 8 of 24 positions shown · non-contrast
Comparison: Previous exam(s).

CLINICAL DATA: Screening.

EXAM:
DIGITAL SCREENING BILATERAL MAMMOGRAM WITH TOMOSYNTHESIS AND CAD
TECHNIQUE: Bilateral screening digital craniocaudal and mediolateral oblique
mammograms were obtained. Bilateral screening digital breast
tomosynthesis was performed. The images were evaluated with
computer-aided detection.

[L CC synth-2D]
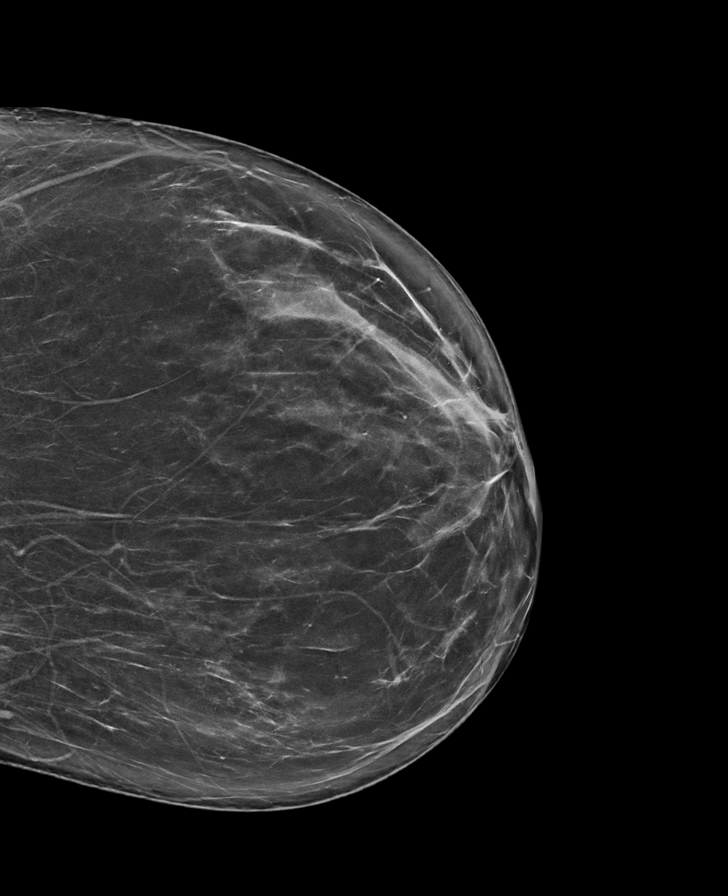

[R MLO synth-2D]
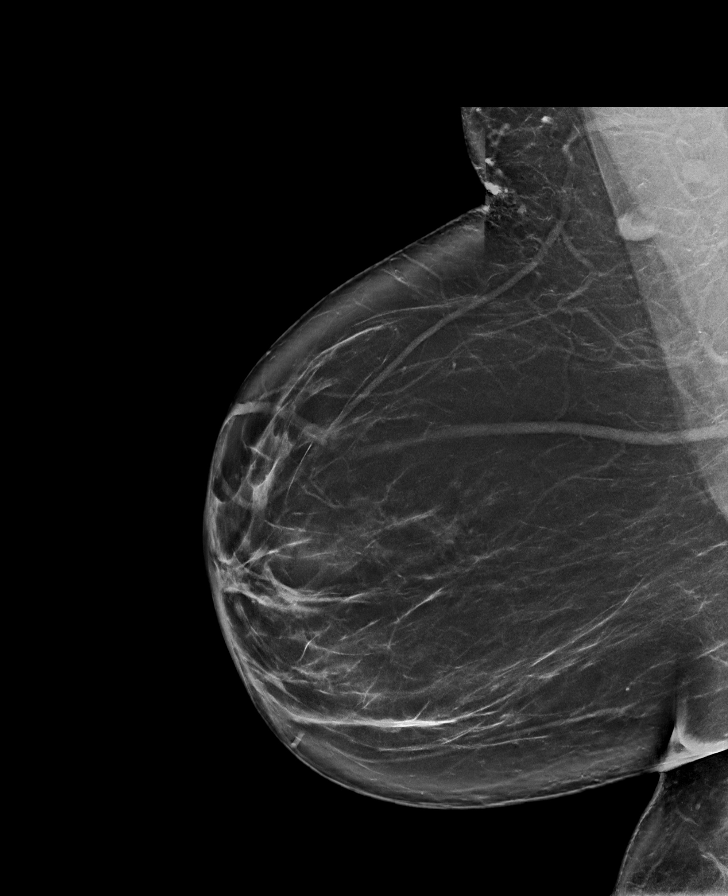

[L MLO synth-2D]
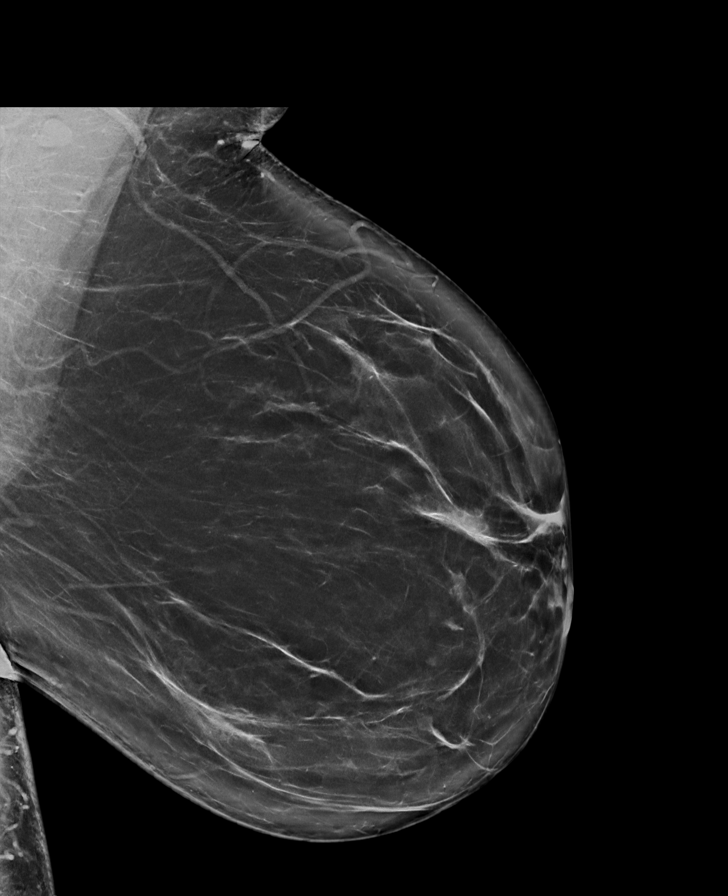

[R CC synth-2D]
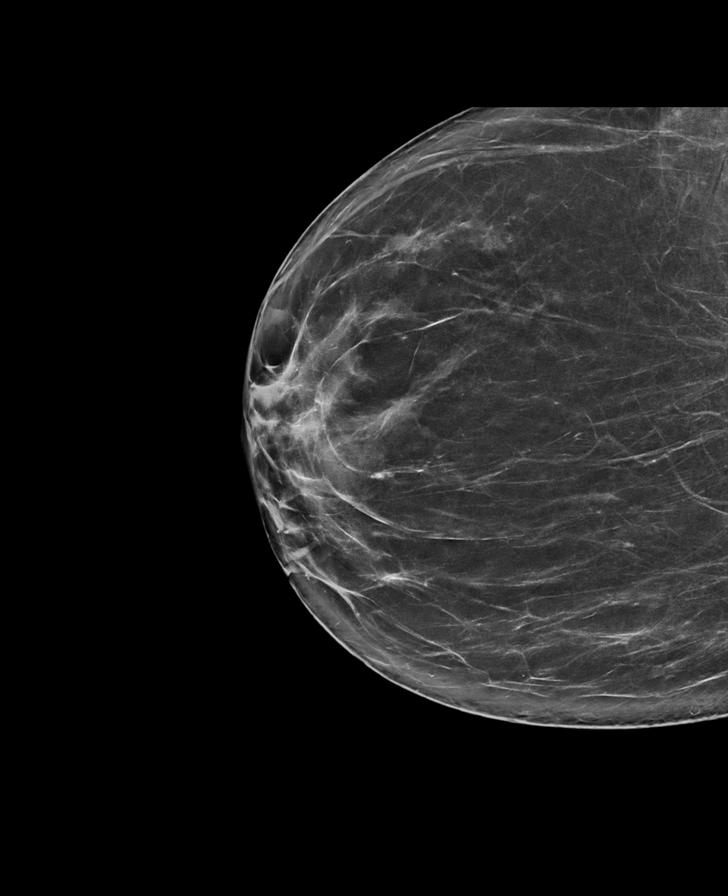

[R CC tomo · tomo slice 43/84.0]
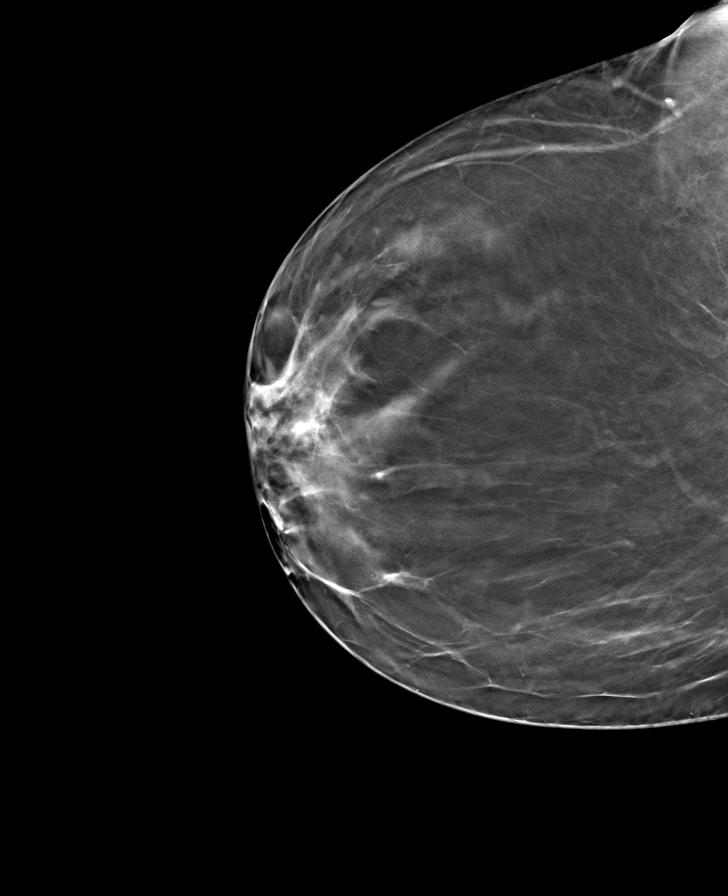

[L MLO tomo · tomo slice 49/97.0]
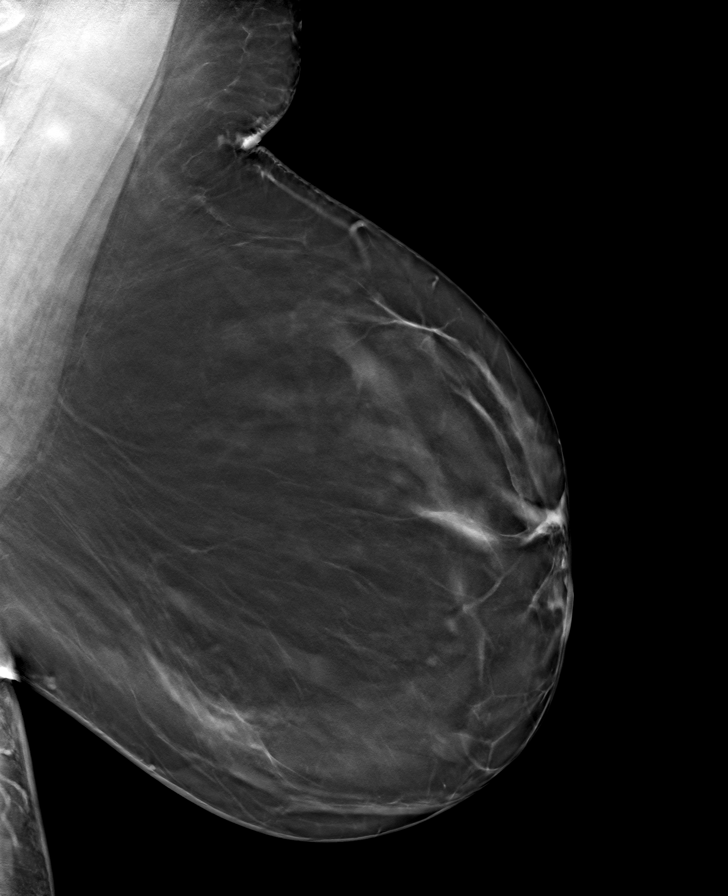

[R MLO tomo · tomo slice 49/98.0]
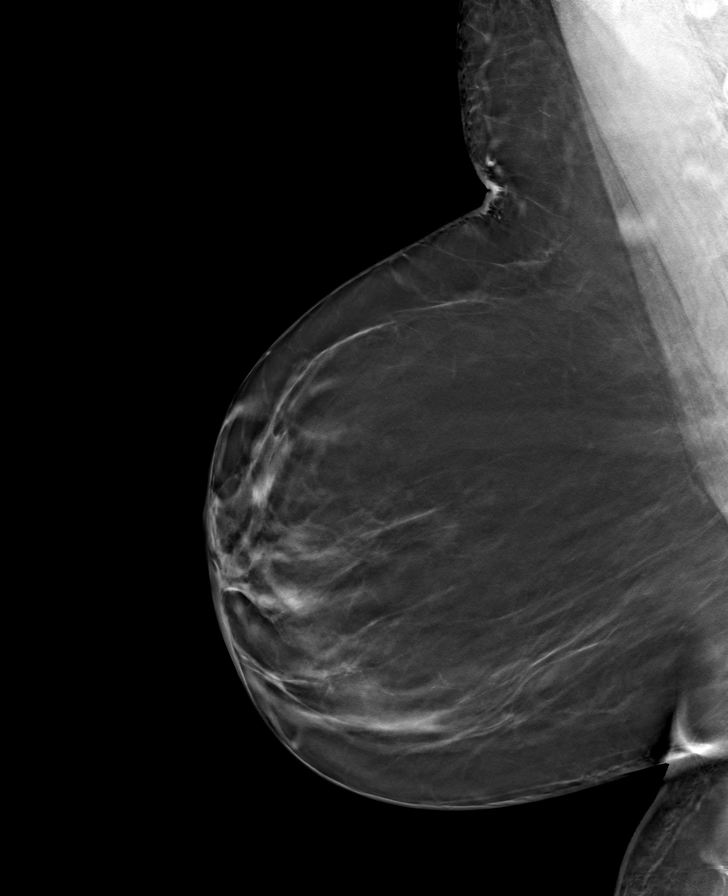

[L CC tomo · tomo slice 43/85.0]
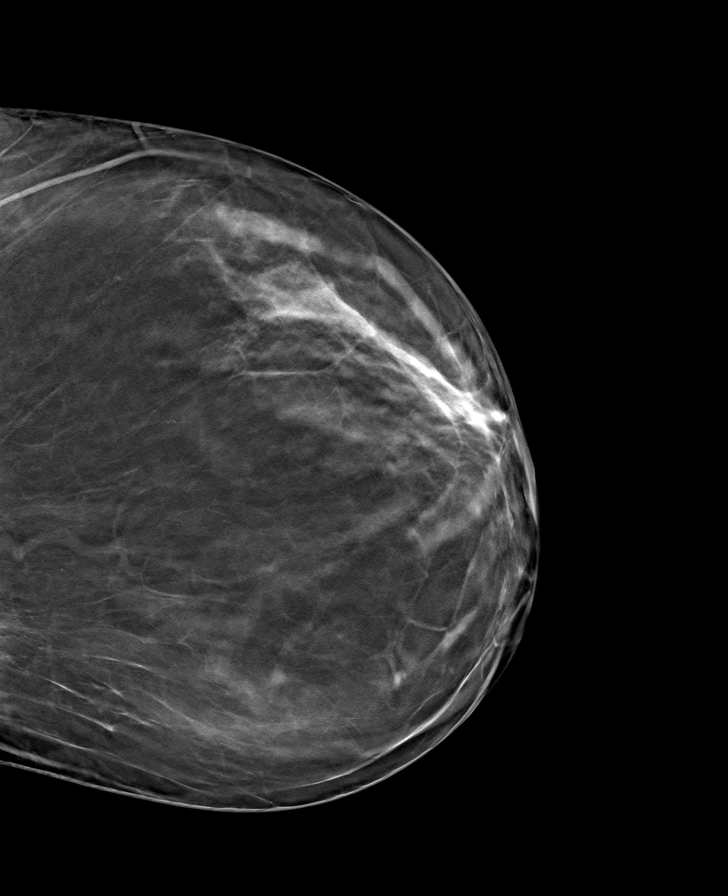

[8 of 24 positions shown; findings below may reference images not displayed]

ACR Breast Density Category b: There are scattered areas of
fibroglandular density.
FINDINGS: There are no findings suspicious for malignancy.
IMPRESSION: No mammographic evidence of malignancy. A result letter of this
screening mammogram will be mailed directly to the patient.

RECOMMENDATION:
Screening mammogram in one year. (Code:51-O-LD2)

BI-RADS CATEGORY  1: Negative.

## 2021-08-21 ENCOUNTER — Other Ambulatory Visit: Payer: Self-pay | Admitting: Family Medicine

## 2021-08-21 ENCOUNTER — Ambulatory Visit
Admission: RE | Admit: 2021-08-21 | Discharge: 2021-08-21 | Disposition: A | Discharge status: Home / Self Care | Payer: No Typology Code available for payment source | Source: Ambulatory Visit

## 2021-08-21 DIAGNOSIS — Z1231 Encounter for screening mammogram for malignant neoplasm of breast: Secondary | ICD-10-CM

## 2021-08-22 ENCOUNTER — Other Ambulatory Visit (HOSPITAL_COMMUNITY): Payer: Self-pay

## 2021-08-24 ENCOUNTER — Other Ambulatory Visit (HOSPITAL_COMMUNITY): Payer: Self-pay

## 2021-08-24 MED ORDER — AMPHETAMINE-DEXTROAMPHET ER 20 MG PO CP24
20.0000 mg | ORAL_CAPSULE | ORAL | 0 refills | Status: DC
  Filled 2021-08-24 – 2021-10-27 (×2): qty 30, 30d supply, fill #0

## 2021-08-24 MED ORDER — AMPHETAMINE-DEXTROAMPHET ER 20 MG PO CP24
20.0000 mg | ORAL_CAPSULE | Freq: Every day | ORAL | 0 refills | Status: DC
  Filled 2021-08-24: qty 30, 30d supply, fill #0

## 2021-08-24 MED ORDER — LISINOPRIL-HYDROCHLOROTHIAZIDE 20-12.5 MG PO TABS
1.0000 | ORAL_TABLET | Freq: Every day | ORAL | 3 refills | Status: DC
  Filled 2021-08-24: qty 90, 90d supply, fill #0
  Filled 2021-12-03: qty 90, 90d supply, fill #1
  Filled 2022-03-12: qty 90, 90d supply, fill #2

## 2021-08-24 MED ORDER — AMPHETAMINE-DEXTROAMPHET ER 20 MG PO CP24
20.0000 mg | ORAL_CAPSULE | Freq: Every day | ORAL | 0 refills | Status: DC
  Filled 2021-08-24 – 2021-12-03 (×2): qty 30, 30d supply, fill #0

## 2021-10-26 ENCOUNTER — Other Ambulatory Visit (HOSPITAL_COMMUNITY): Payer: Self-pay

## 2021-10-26 MED ORDER — BETAMETHASONE DIPROPIONATE AUG 0.05 % EX LOTN
1.0000 | TOPICAL_LOTION | CUTANEOUS | 5 refills | Status: DC
Start: 2021-10-26 — End: 2022-05-26
  Filled 2021-10-26: qty 60, 30d supply, fill #0
  Filled 2021-12-03: qty 60, 30d supply, fill #1
  Filled 2022-01-20: qty 60, 30d supply, fill #2
  Filled 2022-03-12: qty 60, 30d supply, fill #3
  Filled 2022-04-02 – 2022-04-07 (×2): qty 60, 30d supply, fill #4

## 2021-10-26 MED ORDER — MINOXIDIL 2.5 MG PO TABS
1.2500 mg | ORAL_TABLET | Freq: Every day | ORAL | 3 refills | Status: DC
Start: 2021-10-26 — End: 2022-05-26
  Filled 2021-10-26: qty 30, 60d supply, fill #0
  Filled 2021-12-03 – 2021-12-25 (×2): qty 30, 60d supply, fill #1
  Filled 2022-03-12: qty 30, 60d supply, fill #2

## 2021-10-27 ENCOUNTER — Other Ambulatory Visit (HOSPITAL_COMMUNITY): Payer: Self-pay

## 2021-11-02 ENCOUNTER — Encounter: Payer: Self-pay | Admitting: *Deleted

## 2021-11-06 ENCOUNTER — Ambulatory Visit: Payer: No Typology Code available for payment source | Admitting: Family Medicine

## 2021-11-24 ENCOUNTER — Ambulatory Visit (INDEPENDENT_AMBULATORY_CARE_PROVIDER_SITE_OTHER): Payer: No Typology Code available for payment source | Admitting: Family Medicine

## 2021-11-24 ENCOUNTER — Encounter: Payer: Self-pay | Admitting: Family Medicine

## 2021-11-24 VITALS — BP 130/82 | HR 86 | Temp 98.2°F | Ht 68.0 in | Wt 253.2 lb

## 2021-11-24 DIAGNOSIS — Z23 Encounter for immunization: Secondary | ICD-10-CM

## 2021-11-24 DIAGNOSIS — R635 Abnormal weight gain: Secondary | ICD-10-CM

## 2021-11-24 DIAGNOSIS — R739 Hyperglycemia, unspecified: Secondary | ICD-10-CM | POA: Diagnosis not present

## 2021-11-24 DIAGNOSIS — I10 Essential (primary) hypertension: Secondary | ICD-10-CM | POA: Diagnosis not present

## 2021-11-24 DIAGNOSIS — F988 Other specified behavioral and emotional disorders with onset usually occurring in childhood and adolescence: Secondary | ICD-10-CM | POA: Diagnosis not present

## 2021-11-24 LAB — COMPREHENSIVE METABOLIC PANEL
ALT: 17 U/L (ref 0–35)
AST: 19 U/L (ref 0–37)
Albumin: 3.9 g/dL (ref 3.5–5.2)
Alkaline Phosphatase: 75 U/L (ref 39–117)
BUN: 10 mg/dL (ref 6–23)
CO2: 29 mEq/L (ref 19–32)
Calcium: 9.4 mg/dL (ref 8.4–10.5)
Chloride: 103 mEq/L (ref 96–112)
Creatinine, Ser: 0.81 mg/dL (ref 0.40–1.20)
GFR: 85.27 mL/min (ref >60.00)
Glucose, Bld: 109 mg/dL — ABNORMAL HIGH (ref 70–99)
Potassium: 3.3 mEq/L — ABNORMAL LOW (ref 3.5–5.1)
Sodium: 139 mEq/L (ref 135–145)
Total Bilirubin: 0.5 mg/dL (ref 0.2–1.2)
Total Protein: 7.5 g/dL (ref 6.0–8.3)

## 2021-11-24 LAB — HEMOGLOBIN A1C: Hgb A1c MFr Bld: 5.5 % (ref 4.6–6.5)

## 2021-11-24 LAB — TSH: TSH: 1.44 u[IU]/mL (ref 0.35–5.50)

## 2021-11-24 NOTE — Patient Instructions (Addendum)
Please stop by lab before you go If you have mychart- we will send your results within 3 business days of [place] receiving them.  If you do not have mychart- we will call you about results within 5 business days of [place] receiving them.  *please also note that you will see labs on mychart as soon as they post. I will later go in and write notes on them- will say "notes from Dr. Yong Channel"   Recommended follow up: Return in about 6 months (around 05/26/2022) for physical or sooner if needed.Schedule b4 you leave.

## 2021-11-24 NOTE — Progress Notes (Signed)
Phone [phone] In person visit   Subjective:   Candice Parker is a 51 y.o. year old very pleasant female patient who presents for/with See problem oriented charting Chief Complaint  Patient presents with   Follow-up   Hypertension   ADD   Past Medical History-  Patient Active Problem List   Diagnosis Date Noted   Attention deficit disorder (ADD) in adult 06/13/2014    Priority: High   Left knee pain 06/13/2014    Priority: Medium    HTN (hypertension) 07/17/2010    Priority: Medium    Stress headache 07/18/2017    Priority: Low   Left elbow pain 06/13/2014    Priority: Low   Herpes simplex 06/13/2014    Priority: Low   Chest pain, atypical 04/11/2011    Priority: Low   Intermittent memory loss 02/18/2011    Priority: Low   Obesity 12/10/2010    Priority: Low   Asthma 01/29/2009    Priority: Low    Medications- reviewed and updated Current Outpatient Medications  Medication Sig Dispense Refill   acyclovir (ZOVIRAX) 400 MG tablet Take 1 tablet (400 mg total) by mouth 5 (five) times daily for 5 days- take at first sign of cold sore. 30 tablet 2   acyclovir ointment (ZOVIRAX) 5 % Apply 1 application topically every 3 (three) hours. 30 g 1   albuterol (VENTOLIN HFA) 108 (90 Base) MCG/ACT inhaler Inhale 1-2 puffs into the lungs every 6 (six) hours as needed for wheezing or shortness of breath. 18 g 5   amphetamine-dextroamphetamine (ADDERALL XR) 20 MG 24 hr capsule Take 1 capsule (20 mg total) by mouth daily. May fill in 2 months 10/23/21 30 capsule 0   betamethasone, augmented, (DIPROLENE) 0.05 % lotion Apply 1 application  topically to scalp 2 to 3 times a week 60 mL 5   lisinopril-hydrochlorothiazide (ZESTORETIC) 20-12.5 MG tablet TAKE 1 TABLET BY MOUTH DAILY. 90 tablet 3   LORATADINE PO Take 1 tablet by mouth as needed.     minoxidil (LONITEN) 2.5 MG tablet Take 0.5 tablets (1.25 mg total) by mouth daily. 30 tablet 3   minoxidil (LONITEN) 2.5 MG tablet Take 0.5  tablets by mouth daily.     Multiple Vitamin (MULTIVITAMIN) tablet Take 1 tablet by mouth daily. Women's One-A-Day     No current facility-administered medications for this visit.     Objective:  BP 130/82   Pulse 86   Temp 98.2 F (36.8 C)   Ht '5\' 8"'$  (1.727 m)   Wt 253 lb 3.2 oz (114.9 kg)   LMP  (LMP Unknown)   SpO2 96%   BMI 38.50 kg/m  Gen: NAD, resting comfortably CV: RRR no murmurs rubs or gallops Lungs: CTAB no crackles, wheeze, rhonchi No edema    Assessment and Plan   # Attention Deficit Disorder in Adult S:compliant with Adderall XR 20 mg daily.  Controlling symptoms reasonably well - mainly takes during the week- still not taking on weekend - controlled substance contract 07/18/17  -UDS 05/04/2021 - original diagnosis by Dr. Cheryln Manly of [facility] A/P: she reports doing wel- has refills for the moment- she will reach out when needs next set of 3 months and we can fill . Then can call in for additional 3 months.   #hypertension S: compliant with Lisinopril-HCTZ 20-12.5 mg BP Readings from Last 3 Encounters:  11/24/21 130/82  05/04/21 114/88  10/14/20 130/82  A/P: Controlled. Continue current medications.   # Orthostatic hypotension S:when  she has been sitting for a while and goes to stand up she feels dizzy- gets a small jolt of pain on right side of face at same time- like nerve pain she has had with dental issues in past- has not using retainer for invasalign so wonders if something shifting  -doesn't do best job with hydration and sometimes misses meals A/P: sounds like orthostatic hypotension- encouraged to stay well hydrated -also may have element of pain related to dental changes- encouraged follow up with dentist. Also check TSH and electrolytes with paresthesia -also started on minoxidil through [facility]- could contribute  # Weight gain S: up 10 lbs in last 7 months- prior to that had been up 7 lbs in 6 months. Has done some  stress eating- some relief after graduating but may have allowed herself some more food items coming off of more intense stress.   -appetite lower on ADD meds but then loosens as day goes on Lab Results  Component Value Date   TSH 1.49 10/14/2020  A/P: we discussed reversing trend with a few options- she may consider [employer], weight watchers, myfitnesspal, vs intermittent fasting. We will check back in at 6 month physical   Recommended follow up: Return in about 6 months (around 05/26/2022) for physical or sooner if needed.Schedule b4 you leave.   Lab/Order associations:   ICD-10-CM   1. Primary hypertension  I10 Comprehensive metabolic panel    2. Attention deficit disorder (ADD) in adult  F98.8     3. Weight gain  R63.5 TSH    4. Hyperglycemia  R73.9 HgB A1c    5. Need for immunization against influenza  Z23 Flu Vaccine QUAD 58moIM (Fluarix, Fluzone & Alfiuria Quad PF)     No orders of the defined types were placed in this encounter.  Return precautions advised.  SGarret Reddish MD

## 2021-12-03 ENCOUNTER — Other Ambulatory Visit (HOSPITAL_COMMUNITY): Payer: Self-pay

## 2021-12-04 ENCOUNTER — Other Ambulatory Visit (HOSPITAL_COMMUNITY): Payer: Self-pay

## 2021-12-25 ENCOUNTER — Other Ambulatory Visit (HOSPITAL_COMMUNITY): Payer: Self-pay

## 2022-01-20 ENCOUNTER — Other Ambulatory Visit: Payer: Self-pay | Admitting: Family Medicine

## 2022-01-20 ENCOUNTER — Other Ambulatory Visit (HOSPITAL_COMMUNITY): Payer: Self-pay

## 2022-01-20 MED ORDER — AMPHETAMINE-DEXTROAMPHET ER 20 MG PO CP24
20.0000 mg | ORAL_CAPSULE | Freq: Every day | ORAL | 0 refills | Status: DC
  Filled 2022-01-20 – 2022-04-21 (×2): qty 30, 30d supply, fill #0

## 2022-01-20 MED ORDER — AMPHETAMINE-DEXTROAMPHET ER 20 MG PO CP24
20.0000 mg | ORAL_CAPSULE | Freq: Every day | ORAL | 0 refills | Status: DC
  Filled 2022-01-20: qty 30, 30d supply, fill #0

## 2022-01-20 MED ORDER — AMPHETAMINE-DEXTROAMPHET ER 20 MG PO CP24
20.0000 mg | ORAL_CAPSULE | Freq: Every day | ORAL | 0 refills | Status: DC
  Filled 2022-01-20 – 2022-03-12 (×2): qty 30, 30d supply, fill #0

## 2022-01-21 ENCOUNTER — Other Ambulatory Visit: Payer: Self-pay

## 2022-02-15 ENCOUNTER — Ambulatory Visit
Admission: EM | Admit: 2022-02-15 | Discharge: 2022-02-15 | Disposition: A | Discharge status: Home / Self Care | Payer: 59 | Attending: Family Medicine | Admitting: Family Medicine

## 2022-02-15 ENCOUNTER — Other Ambulatory Visit (HOSPITAL_COMMUNITY): Payer: Self-pay

## 2022-02-15 DIAGNOSIS — H65191 Other acute nonsuppurative otitis media, right ear: Secondary | ICD-10-CM | POA: Diagnosis not present

## 2022-02-15 DIAGNOSIS — J0100 Acute maxillary sinusitis, unspecified: Secondary | ICD-10-CM | POA: Diagnosis not present

## 2022-02-15 MED ORDER — AMOXICILLIN-POT CLAVULANATE 875-125 MG PO TABS
1.0000 | ORAL_TABLET | Freq: Two times a day (BID) | ORAL | 0 refills | Status: DC
  Filled 2022-02-15: qty 14, 7d supply, fill #0

## 2022-02-15 MED ORDER — FLUTICASONE PROPIONATE 50 MCG/ACT NA SUSP
1.0000 | Freq: Two times a day (BID) | NASAL | 2 refills | Status: DC
  Filled 2022-02-15: qty 16, 30d supply, fill #0

## 2022-02-15 NOTE — ED Provider Notes (Signed)
[facility]    CSN: [id number] Arrival date & time: 02/15/22  1038      History   Chief Complaint Chief Complaint  Patient presents with   Cough   Headache   Sore Throat    HPI Candice Parker is a 52 y.o. female.   Patient presenting today with progressively worsening sinus pain, congestion, right ear pain, headache, and lingering cough since a flulike illness that started last week.  She denies fever, chills, chest pain, shortness of breath, abdominal pain, nausea vomiting or diarrhea.  Taking TheraFlu, ibuprofen with minimal relief of symptoms.  History of seasonal allergies and asthma on as needed medications for both.    Past Medical History:  Diagnosis Date   Acid reflux 12/10/2010   hx of-diet controlled   Allergy    sesaonal allergies   ASTHMA, INTRINSIC 01/29/2009   uses inhaler PRN   Breast lesion 09/29/2011   Hypertension    on meds   Overweight(278.02) 12/10/2010    Patient Active Problem List   Diagnosis Date Noted   Stress headache 07/18/2017   Attention deficit disorder (ADD) in adult 06/13/2014   Left elbow pain 06/13/2014   Herpes simplex 06/13/2014   Left knee pain 06/13/2014   Chest pain, atypical 04/11/2011   Intermittent memory loss 02/18/2011   Obesity 12/10/2010   HTN (hypertension) 07/17/2010   Asthma 01/29/2009    Past Surgical History:  Procedure Laterality Date   ELBOW FRACTURE SURGERY Left    INNER EAR SURGERY Left    TOTAL ABDOMINAL HYSTERECTOMY     fibroid uterus.    OB History   No obstetric history on file.      Home Medications    Prior to Admission medications   Medication Sig Start Date End Date Taking? Authorizing Provider  albuterol (VENTOLIN HFA) 108 (90 Base) MCG/ACT inhaler Inhale 1-2 puffs into the lungs every 6 (six) hours as needed for wheezing or shortness of breath. 10/14/20  Yes Marin Olp, MD  amoxicillin-clavulanate (AUGMENTIN) 875-125 MG tablet Take 1 tablet by mouth every 12 (twelve)  hours. 02/15/22  Yes Volney American, PA-C  amphetamine-dextroamphetamine (ADDERALL XR) 20 MG 24 hr capsule Take 1 capsule (20 mg total) by mouth daily. 01/20/22  Yes Marin Olp, MD  amphetamine-dextroamphetamine (ADDERALL XR) 20 MG 24 hr capsule Take 1 capsule (20 mg total) by mouth daily. 01/20/22  Yes Marin Olp, MD  amphetamine-dextroamphetamine (ADDERALL XR) 20 MG 24 hr capsule Take 1 capsule (20 mg total) by mouth daily. 02/20/22  Yes Marin Olp, MD  fluticasone [facility]) 50 MCG/ACT nasal spray Place 1 spray into both nostrils 2 (two) times daily. 02/15/22  Yes Volney American, PA-C  lisinopril-hydrochlorothiazide (ZESTORETIC) 20-12.5 MG tablet TAKE 1 TABLET BY MOUTH DAILY. 08/24/21  Yes Marin Olp, MD  minoxidil (LONITEN) 2.5 MG tablet Take 0.5 tablets (1.25 mg total) by mouth daily. 10/26/21  Yes   minoxidil (LONITEN) 2.5 MG tablet Take 0.5 tablets by mouth daily. 10/26/21  Yes [provider]  Multiple Vitamin (MULTIVITAMIN) tablet Take 1 tablet by mouth daily. Women's One-A-Day   Yes [provider]  acyclovir (ZOVIRAX) 400 MG tablet Take 1 tablet (400 mg total) by mouth 5 (five) times daily for 5 days- take at first sign of cold sore. 05/04/21   Marin Olp, MD  acyclovir ointment (ZOVIRAX) 5 % Apply 1 application topically every 3 (three) hours. 06/20/19   Marin Olp, MD  betamethasone,  augmented, (DIPROLENE) 0.05 % lotion Apply 1 application  topically to scalp 2 to 3 times a week 10/26/21     LORATADINE PO Take 1 tablet by mouth as needed.    [provider]  potassium chloride SA (KLOR-CON) 20 MEQ tablet Take one tablet by mouth daily for 4 days then hold the remaining 6 pills. 04/09/20 04/18/20  Marin Olp, MD    Family History Family History  Problem Relation Age of Onset   Hypertension Mother    Hyperlipidemia Mother    Endometrial cancer Mother        stage I detected in 2023- did well with surgery    Heart failure Father        drug induced   Kidney disease Father        failure   Hypertension Brother    Breast cancer Maternal Grandmother    Breast cancer Paternal Grandmother    Colon polyps Neg Hx    Colon cancer Neg Hx    Esophageal cancer Neg Hx    Rectal cancer Neg Hx    Stomach cancer Neg Hx     Social History Social History   Tobacco Use   Smoking status: Never   Smokeless tobacco: Never  Vaping Use   Vaping Use: Never used  Substance Use Topics   Alcohol use: Yes    Alcohol/week: 3.0 standard drinks of alcohol    Types: 3 Standard drinks or equivalent per week    Comment: Occasional   Drug use: No     Allergies   Patient has no known allergies.   Review of Systems Review of Systems PER HPI  Physical Exam Triage Vital Signs ED Triage Vitals  Enc Vitals Group     BP 02/15/22 1202 (!) 158/87     Pulse Rate 02/15/22 1202 92     Resp 02/15/22 1202 16     Temp 02/15/22 1202 99 F (37.2 C)     Temp Source 02/15/22 1202 Oral     SpO2 02/15/22 1202 94 %     Weight --      Height --      Head Circumference --      Peak Flow --      Pain Score 02/15/22 1205 4     Pain Loc --      Pain Edu? --      Excl. in [place]? --    No data found.  Updated Vital Signs BP (!) 158/87 (BP Location: Right Arm)   Pulse 92   Temp 99 F (37.2 C) (Oral)   Resp 16   LMP  (LMP Unknown)   SpO2 94%   Visual Acuity Right Eye Distance:   Left Eye Distance:   Bilateral Distance:    Right Eye Near:   Left Eye Near:    Bilateral Near:     Physical Exam Vitals and nursing note reviewed.  Constitutional:      Appearance: Normal appearance.  HENT:     Head: Atraumatic.     Right Ear: External ear normal.     Left Ear: Tympanic membrane and external ear normal.     Ears:     Comments: Right middle ear effusion     Nose: Congestion present.     Mouth/Throat:     Mouth: Mucous membranes are moist.     Pharynx: Posterior oropharyngeal erythema present.  Eyes:      Extraocular Movements: Extraocular movements intact.  Conjunctiva/sclera: Conjunctivae normal.  Cardiovascular:     Rate and Rhythm: Normal rate and regular rhythm.     Heart sounds: Normal heart sounds.  Pulmonary:     Effort: Pulmonary effort is normal.     Breath sounds: Normal breath sounds. No wheezing or rales.  Musculoskeletal:        General: Normal range of motion.     Cervical back: Normal range of motion and neck supple.  Skin:    General: Skin is warm and dry.  Neurological:     Mental Status: She is alert and oriented to person, place, and time.  Psychiatric:        Mood and Affect: Mood normal.        Thought Content: Thought content normal.    UC Treatments / Results  Labs (all labs ordered are listed, but only abnormal results are displayed) Labs Reviewed - No data to display  EKG   Radiology No results found.  Procedures Procedures (including critical care time)  Medications Ordered in UC Medications - No data to display  Initial Impression / Assessment and Plan / UC Course  I have reviewed the triage vital signs and the nursing notes.  Pertinent labs & imaging results that were available during my care of the patient were reviewed by me and considered in my medical decision making (see chart for details).     Given duration and worsening course, will treat with Augmentin, Flonase, supportive over-the-counter medications and home care.  Return for any worsening symptoms.  Final Clinical Impressions(s) / UC Diagnoses   Final diagnoses:  Acute non-recurrent maxillary sinusitis  Acute middle ear effusion, right   Discharge Instructions   None    ED Prescriptions     Medication Sig Dispense Auth. Provider   amoxicillin-clavulanate (AUGMENTIN) 875-125 MG tablet Take 1 tablet by mouth every 12 (twelve) hours. 14 tablet Volney American, PA-C   fluticasone [facility]) 50 MCG/ACT nasal spray Place 1 spray into both nostrils 2 (two) times  daily. 16 g Volney American, [place]      PDMP not reviewed this encounter.   Volney American, [place] 02/15/22 1238

## 2022-02-15 NOTE — ED Triage Notes (Signed)
Cough, ear aches, sore throat, headache. Was around grandson that had flu A last week. Taking Theraflu, ibuprofen.

## 2022-02-19 ENCOUNTER — Ambulatory Visit (INDEPENDENT_AMBULATORY_CARE_PROVIDER_SITE_OTHER): Payer: 59 | Admitting: Family Medicine

## 2022-02-19 ENCOUNTER — Other Ambulatory Visit (HOSPITAL_COMMUNITY): Payer: Self-pay

## 2022-02-19 ENCOUNTER — Encounter: Payer: Self-pay | Admitting: Family Medicine

## 2022-02-19 VITALS — BP 128/84 | HR 94 | Temp 98.0°F | Ht 68.0 in | Wt 244.4 lb

## 2022-02-19 DIAGNOSIS — I10 Essential (primary) hypertension: Secondary | ICD-10-CM | POA: Diagnosis not present

## 2022-02-19 DIAGNOSIS — R059 Cough, unspecified: Secondary | ICD-10-CM

## 2022-02-19 DIAGNOSIS — J4520 Mild intermittent asthma, uncomplicated: Secondary | ICD-10-CM | POA: Diagnosis not present

## 2022-02-19 LAB — POCT RAPID STREP A (OFFICE): Rapid Strep A Screen: NEGATIVE

## 2022-02-19 LAB — POCT INFLUENZA A/B
Influenza A, POC: NEGATIVE
Influenza B, POC: NEGATIVE

## 2022-02-19 LAB — POC COVID19 BINAXNOW: SARS Coronavirus 2 Ag: NEGATIVE

## 2022-02-19 MED ORDER — ERYTHROMYCIN 5 MG/GM OP OINT
TOPICAL_OINTMENT | OPHTHALMIC | 0 refills | Status: DC
  Filled 2022-02-19: qty 3.5, 7d supply, fill #0

## 2022-02-19 NOTE — Progress Notes (Signed)
Phone [phone] In person visit   Subjective:   Candice Parker is a 52 y.o. year old very pleasant female patient who presents for/with See problem oriented charting Chief Complaint  Patient presents with   Follow-up    Pt states sx started last week, went to urgent care they didn't run any test, and just stated she had sinus infection and gave antibiotics. Pt states had low grade fever Monday, and started antibiotic Monday. Pt states not having drainage, just feels like she has congestion in throat and mucus in eyes in morning.    Sinusitis   Sore Throat   Cough   Past Medical History-  Patient Active Problem List   Diagnosis Date Noted   Attention deficit disorder (ADD) in adult 06/13/2014    Priority: High   Left knee pain 06/13/2014    Priority: Medium    HTN (hypertension) 07/17/2010    Priority: Medium    Stress headache 07/18/2017    Priority: Low   Left elbow pain 06/13/2014    Priority: Low   Herpes simplex 06/13/2014    Priority: Low   Chest pain, atypical 04/11/2011    Priority: Low   Intermittent memory loss 02/18/2011    Priority: Low   Obesity 12/10/2010    Priority: Low   Asthma 01/29/2009    Priority: Low    Medications- reviewed and updated Current Outpatient Medications  Medication Sig Dispense Refill   acyclovir (ZOVIRAX) 400 MG tablet Take 1 tablet (400 mg total) by mouth 5 (five) times daily for 5 days- take at first sign of cold sore. 30 tablet 2   acyclovir ointment (ZOVIRAX) 5 % Apply 1 application topically every 3 (three) hours. 30 g 1   albuterol (VENTOLIN HFA) 108 (90 Base) MCG/ACT inhaler Inhale 1-2 puffs into the lungs every 6 (six) hours as needed for wheezing or shortness of breath. 18 g 5   amoxicillin-clavulanate (AUGMENTIN) 875-125 MG tablet Take 1 tablet by mouth every 12 (twelve) hours. 14 tablet 0   amphetamine-dextroamphetamine (ADDERALL XR) 20 MG 24 hr capsule Take 1 capsule (20 mg total) by mouth daily. 30 capsule 0    amphetamine-dextroamphetamine (ADDERALL XR) 20 MG 24 hr capsule Take 1 capsule (20 mg total) by mouth daily. 30 capsule 0   [START ON 02/20/2022] amphetamine-dextroamphetamine (ADDERALL XR) 20 MG 24 hr capsule Take 1 capsule (20 mg total) by mouth daily. 30 capsule 0   betamethasone, augmented, (DIPROLENE) 0.05 % lotion Apply 1 application  topically to scalp 2 to 3 times a week 60 mL 5   erythromycin ophthalmic ointment One-half inch (1.25 cm) four times daily for 5 to 7 days to affected eye- both 3.5 g 0   fluticasone (FLONASE) 50 MCG/ACT nasal spray Place 1 spray into both nostrils 2 (two) times daily. 16 g 2   lisinopril-hydrochlorothiazide (ZESTORETIC) 20-12.5 MG tablet TAKE 1 TABLET BY MOUTH DAILY. 90 tablet 3   LORATADINE PO Take 1 tablet by mouth as needed.     minoxidil (LONITEN) 2.5 MG tablet Take 0.5 tablets (1.25 mg total) by mouth daily. 30 tablet 3   minoxidil (LONITEN) 2.5 MG tablet Take 0.5 tablets by mouth daily.     Multiple Vitamin (MULTIVITAMIN) tablet Take 1 tablet by mouth daily. Women's One-A-Day     No current facility-administered medications for this visit.     Objective:  BP 128/84 (BP Location: Right Arm, Patient Position: Sitting)   Pulse 94   Temp 98 F (36.7 C) (Temporal)  Ht '5\' 8"'$  (1.727 m)   Wt 244 lb 6.4 oz (110.9 kg)   LMP  (LMP Unknown)   SpO2 98%   BMI 37.16 kg/m  Gen: NAD, resting comfortably TM normal, nasal turbinates erythematous and edematous swollen with some clear mixed with yellow discharge, pharynx mildly erythematous but otherwise reassuring exam CV: RRR no murmurs rubs or gallops Lungs: CTAB no crackles, wheeze, rhonchi Ext: no edema Skin: warm, dry  Negative covid, flu, and strep test     Assessment and Plan   # Sore throat/throat drainage/cough S: Symptoms started about 2 weeks ago- actually new years day. Grandson had been sick with RSV and she had exposure. He then later got influenza. Went to urgent care-no test were run (and  notes they reported progressively worsening sinus pain, right ear pain, headaches from symptoms that started a week prior and she had already tried TheraFlu, ibuprofen).  She was told she had a sinus infection and was given antibiotics Augmentin-also appears she had a right middle ear effusion- flonase advised- hasn't started- she doesn't love putting things in nose.   Today,  She feels like she primarily has drainage in her throat-not blowing out much from her nose- feels like something is in her throat and she gets hoarse. Does not feel chest congestion.   Does get some mucus in eyes in the morning- clears and does not get recurrently in the day. She has noted improvement in symptoms on augmentin- throat not as sore and ear pain has resolved.  -No wheeze. Mild shortness of breath earlier in illness has resolved.   - trying to stay hydrated A/P: sore throat/drainage/cough improving- I do think she had bacterial sinusitis with double sickening and I think the augmentin is appropriate- should finish course  -also possibly has bacterial conjunctivitis with thick drainage each morning though calms in the day- will cover with erythromycin ointment - as below- do not think aslthma involve-d hold off on prednisone  #hypertension S: compliant with Lisinopril-HTCZ 20-12.5 mg.  BP Readings from Last 3 Encounters:  02/19/22 128/84  02/15/22 (!) 158/87  11/24/21 130/82  A/P: stable- continue current medicines  -did note importance of staying very well hydrated and resting more with illness  # Asthma S:Using Albuterol HFA prn.  No recent issues even with above illness A/P: doing well- remains mild intermittent- continue current medications prn albuterol  Recommended follow up: Return for as needed for new, worsening, persistent symptoms. Future Appointments  Date Time Provider [place]  05/27/2022  8:20 AM Marin Olp, MD [facility]   Lab/Order associations:   ICD-10-CM   1. Cough,  unspecified type  R05.9 POC COVID-19    POCT rapid strep A    POCT Influenza A/B    2. Primary hypertension  I10     3. Mild intermittent asthma without complication  T24.58       Meds ordered this encounter  Medications   erythromycin ophthalmic ointment    Sig: One-half inch (1.25 cm) four times daily for 5 to 7 days to affected eye- both    Dispense:  3.5 g    Refill:  0    Return precautions advised.  Garret Reddish, MD

## 2022-02-19 NOTE — Patient Instructions (Addendum)
sore throat/drainage/cough improving- I do think she had bacterial sinusitis with double sickening and I think the augmentin is appropriate- should finish course  -also possibly has bacterial conjunctivitis with thick drainage each morning though calms in the day- will cover with erythromycin ointment -did note importance of staying very well hydrated and resting more with illness  Recommended follow up: Return for as needed for new, worsening, persistent symptoms.

## 2022-03-01 ENCOUNTER — Other Ambulatory Visit (HOSPITAL_COMMUNITY): Payer: Self-pay

## 2022-03-12 ENCOUNTER — Other Ambulatory Visit: Payer: Self-pay

## 2022-03-12 ENCOUNTER — Other Ambulatory Visit (HOSPITAL_COMMUNITY): Payer: Self-pay

## 2022-03-15 ENCOUNTER — Other Ambulatory Visit (HOSPITAL_COMMUNITY): Payer: Self-pay

## 2022-04-02 ENCOUNTER — Other Ambulatory Visit (HOSPITAL_COMMUNITY): Payer: Self-pay

## 2022-04-07 ENCOUNTER — Other Ambulatory Visit (HOSPITAL_COMMUNITY): Payer: Self-pay

## 2022-04-07 ENCOUNTER — Other Ambulatory Visit: Payer: Self-pay

## 2022-04-21 ENCOUNTER — Other Ambulatory Visit (HOSPITAL_COMMUNITY): Payer: Self-pay

## 2022-04-26 ENCOUNTER — Other Ambulatory Visit (HOSPITAL_COMMUNITY): Payer: Self-pay

## 2022-04-26 DIAGNOSIS — L658 Other specified nonscarring hair loss: Secondary | ICD-10-CM | POA: Insufficient documentation

## 2022-04-26 DIAGNOSIS — L6681 Central centrifugal cicatricial alopecia: Secondary | ICD-10-CM | POA: Insufficient documentation

## 2022-04-26 MED ORDER — FINASTERIDE 5 MG PO TABS
5.0000 mg | ORAL_TABLET | Freq: Every day | ORAL | 3 refills | Status: DC
  Filled 2022-04-26: qty 90, 90d supply, fill #0
  Filled 2022-07-23: qty 90, 90d supply, fill #1
  Filled 2022-10-30: qty 90, 90d supply, fill #2

## 2022-04-26 MED ORDER — BETAMETHASONE DIPROPIONATE AUG 0.05 % EX LOTN
1.0000 | TOPICAL_LOTION | CUTANEOUS | 5 refills | Status: DC
  Filled 2022-04-26: qty 60, 30d supply, fill #0
  Filled 2022-07-05: qty 60, 30d supply, fill #1

## 2022-04-26 MED ORDER — MINOXIDIL 2.5 MG PO TABS
1.2500 mg | ORAL_TABLET | Freq: Every day | ORAL | 3 refills | Status: DC
  Filled 2022-04-26: qty 45, 90d supply, fill #0
  Filled 2022-08-09: qty 45, 90d supply, fill #1
  Filled 2022-11-26: qty 45, 90d supply, fill #2

## 2022-04-29 ENCOUNTER — Other Ambulatory Visit (HOSPITAL_COMMUNITY): Payer: Self-pay

## 2022-05-25 ENCOUNTER — Encounter: Payer: Self-pay | Admitting: Family Medicine

## 2022-05-25 NOTE — Telephone Encounter (Signed)
Scheduled vv with [facility] on 05/26/22 due to no openings with [facility].

## 2022-05-26 ENCOUNTER — Other Ambulatory Visit: Payer: Self-pay | Admitting: Family Medicine

## 2022-05-26 ENCOUNTER — Telehealth (INDEPENDENT_AMBULATORY_CARE_PROVIDER_SITE_OTHER): Payer: 59 | Admitting: Family Medicine

## 2022-05-26 ENCOUNTER — Encounter: Payer: Self-pay | Admitting: Family Medicine

## 2022-05-26 ENCOUNTER — Other Ambulatory Visit (HOSPITAL_COMMUNITY): Payer: Self-pay

## 2022-05-26 VITALS — BP 139/88 | Ht 68.0 in | Wt 240.0 lb

## 2022-05-26 DIAGNOSIS — F988 Other specified behavioral and emotional disorders with onset usually occurring in childhood and adolescence: Secondary | ICD-10-CM | POA: Diagnosis not present

## 2022-05-26 DIAGNOSIS — I10 Essential (primary) hypertension: Secondary | ICD-10-CM | POA: Diagnosis not present

## 2022-05-26 DIAGNOSIS — T783XXA Angioneurotic edema, initial encounter: Secondary | ICD-10-CM | POA: Diagnosis not present

## 2022-05-26 MED ORDER — ALBUTEROL SULFATE HFA 108 (90 BASE) MCG/ACT IN AERS
1.0000 | INHALATION_SPRAY | Freq: Four times a day (QID) | RESPIRATORY_TRACT | 5 refills | Status: DC | PRN
  Filled 2022-05-26: qty 6.7, 25d supply, fill #0
  Filled 2022-07-23: qty 6.7, 25d supply, fill #1

## 2022-05-26 MED ORDER — AMPHETAMINE-DEXTROAMPHET ER 20 MG PO CP24
20.0000 mg | ORAL_CAPSULE | Freq: Every day | ORAL | 0 refills | Status: DC
  Filled 2022-05-26 – 2022-07-05 (×2): qty 30, 30d supply, fill #0

## 2022-05-26 MED ORDER — AMPHETAMINE-DEXTROAMPHET ER 20 MG PO CP24
20.0000 mg | ORAL_CAPSULE | Freq: Every day | ORAL | 0 refills | Status: DC
  Filled 2022-05-26 (×2): qty 30, 30d supply, fill #0

## 2022-05-26 MED ORDER — HYDROCHLOROTHIAZIDE 12.5 MG PO CAPS
12.5000 mg | ORAL_CAPSULE | Freq: Every day | ORAL | 5 refills | Status: DC
  Filled 2022-05-26: qty 30, 30d supply, fill #0
  Filled 2022-06-28: qty 30, 30d supply, fill #1
  Filled 2022-07-23: qty 30, 30d supply, fill #2
  Filled 2022-08-31: qty 30, 30d supply, fill #3
  Filled 2022-10-02: qty 30, 30d supply, fill #4
  Filled 2022-10-30: qty 30, 30d supply, fill #5

## 2022-05-26 MED ORDER — AMPHETAMINE-DEXTROAMPHET ER 20 MG PO CP24
20.0000 mg | ORAL_CAPSULE | Freq: Every day | ORAL | 0 refills | Status: DC
  Filled 2022-05-26: qty 30, 30d supply, fill #0

## 2022-05-26 NOTE — Patient Instructions (Signed)
There are no preventive care reminders to display for this patient.  Recommended follow up: No follow-ups on file. 

## 2022-05-26 NOTE — Progress Notes (Signed)
Phone [phone] Virtual visit via Video note   Subjective:  Chief complaint: Chief Complaint  Patient presents with   swollen tongue    Pt c/o swollen tongue on Monday and was told to hold of on lisinopril as this may be the cause states the swelling resolved on its on.   Our team/I connected with Candice Parker at 10:20 AM EDT by a video enabled telemedicine application (caregility through epic) and verified that I am speaking with the correct person using two identifiers. Our team/I discussed the limitations of evaluation and management by telemedicine and the availability of in person appointments.No physical exam was performed (except for noted visual exam or audio findings with Telehealth visits).   Location patient: [place] Location provider: [facility], office Persons participating in the virtual visit:  patient  Past Medical History-  Patient Active Problem List   Diagnosis Date Noted   Attention deficit disorder (ADD) in adult 06/13/2014    Priority: High   Left knee pain 06/13/2014    Priority: Medium    HTN (hypertension) 07/17/2010    Priority: Medium    Stress headache 07/18/2017    Priority: Low   Left elbow pain 06/13/2014    Priority: Low   Herpes simplex 06/13/2014    Priority: Low   Chest pain, atypical 04/11/2011    Priority: Low   Intermittent memory loss 02/18/2011    Priority: Low   Obesity 12/10/2010    Priority: Low   Asthma 01/29/2009    Priority: Low    Medications- reviewed and updated Current Outpatient Medications  Medication Sig Dispense Refill   acyclovir (ZOVIRAX) 400 MG tablet Take 1 tablet (400 mg total) by mouth 5 (five) times daily for 5 days- take at first sign of cold sore. 30 tablet 2   acyclovir ointment (ZOVIRAX) 5 % Apply 1 application topically every 3 (three) hours. 30 g 1   betamethasone, augmented, (DIPROLENE) 0.05 % lotion Apply 1 Application topically to scalp 3-4 times a week. Avoid applying to face. 60 mL 5    finasteride (PROSCAR) 5 MG tablet Take 1 tablet (5 mg total) by mouth daily. 90 tablet 3   hydrochlorothiazide (MICROZIDE) 12.5 MG capsule Take 1 capsule (12.5 mg total) by mouth daily. 30 capsule 5   LORATADINE PO Take 1 tablet by mouth as needed.     minoxidil (LONITEN) 2.5 MG tablet Take 0.5 tablets (1.25 mg total) by mouth daily. 45 tablet 3   Multiple Vitamin (MULTIVITAMIN) tablet Take 1 tablet by mouth daily. Women's One-A-Day     albuterol (VENTOLIN HFA) 108 (90 Base) MCG/ACT inhaler Inhale 1-2 puffs into the lungs every 6 (six) hours as needed for wheezing or shortness of breath. 18 g 5   amphetamine-dextroamphetamine (ADDERALL XR) 20 MG 24 hr capsule Take 1 capsule (20 mg total) by mouth daily. 30 capsule 0   amphetamine-dextroamphetamine (ADDERALL XR) 20 MG 24 hr capsule Take 1 capsule (20 mg total) by mouth daily. 30 capsule 0   amphetamine-dextroamphetamine (ADDERALL XR) 20 MG 24 hr capsule Take 1 capsule (20 mg total) by mouth daily. 30 capsule 0   No current facility-administered medications for this visit.     Objective:  BP 139/88   Ht  (1.727 m)   Wt 240 lb (108.9 kg)   LMP  (LMP Unknown)   BMI 36.49 kg/m  self reported vitals Gen: NAD, resting comfortably No visible tongue swelling today Lungs: nonlabored, normal respiratory rate  Skin: appears dry, no  obvious rash     Assessment and Plan   # Attention Deficit Disorder in Adult S:compliant with Adderall XR 20 mg daily.  Controlling symptoms - yes - controlled substance contract 07/18/17  -UDS 05/04/2021 - original diagnosis by Dr. Dellia Cloud of [facility] A/P: stable- continue current medicines    #angioedema #hypertension S: compliant with Lisinopril-HTCZ 20-12.5 mg typically btu had to stop after noted to have tongue swelling by PA she works with.  She reports blood pressure higher after this reduction .   Has been waking up recently with lips really dry and wondered if not getting enough  water but early Monday morning woke up with dry cough- drank water and it did not go down smoothly- looked at tongue and was rather large. Chewed ice chips and took her regular loratadine. At first thought she noted whitish discoloration on side of tongue - she has not noted that anymore BP Readings from Last 3 Encounters:  05/26/22 139/88  02/19/22 128/84  02/15/22 (!) 158/87  A/P: Hypertension high acceptable range off lisinopril hydrochlorothiazide 20-12.5 mg, prefer <135/85 at least if not 130/80- we will add back in hydrochlorothiazide 12.5 mg and see her back at Annual Preventive Medicine and Wellness Visit on 06/03/22 -advised no long term ace-I or ARB- could have alternate reason for tongue swelling but risk too high to continue  # Asthma S:Using Albuterol HFA prn.  With allergy season needing more frequently A/P: refill albuterol today- check in on usage frequency next week  Recommended follow up: as below  Future Appointments  Date Time Provider Department Center  06/03/2022 11:00 AM Shelva Majestic, MD [facility]  08/26/2022  7:00 AM [facility] 3 [facility]  11/09/2022 10:00 AM Shelva Majestic, MD [facility]    Lab/Order associations:   ICD-10-CM   1. Primary hypertension  I10     2. Attention deficit disorder (ADD) in adult  F98.8     3. Angioedema, initial encounter  T78.3XXA       Meds ordered this encounter  Medications   amphetamine-dextroamphetamine (ADDERALL XR) 20 MG 24 hr capsule    Sig: Take 1 capsule (20 mg total) by mouth daily.    Dispense:  30 capsule    Refill:  0    May fill in 2 months.   amphetamine-dextroamphetamine (ADDERALL XR) 20 MG 24 hr capsule    Sig: Take 1 capsule (20 mg total) by mouth daily.    Dispense:  30 capsule    Refill:  0    May fill today.   amphetamine-dextroamphetamine (ADDERALL XR) 20 MG 24 hr capsule    Sig: Take 1 capsule (20 mg total) by mouth daily.    Dispense:  30 capsule    Refill:  0    May fill in  1 month.   albuterol (VENTOLIN HFA) 108 (90 Base) MCG/ACT inhaler    Sig: Inhale 1-2 puffs into the lungs every 6 (six) hours as needed for wheezing or shortness of breath.    Dispense:  18 g    Refill:  5   hydrochlorothiazide (MICROZIDE) 12.5 MG capsule    Sig: Take 1 capsule (12.5 mg total) by mouth daily.    Dispense:  30 capsule    Refill:  5   Return precautions advised.  Tana Conch, MD

## 2022-05-27 ENCOUNTER — Other Ambulatory Visit (HOSPITAL_COMMUNITY): Payer: Self-pay

## 2022-05-27 ENCOUNTER — Encounter: Payer: 59 | Admitting: Family Medicine

## 2022-06-03 ENCOUNTER — Ambulatory Visit (INDEPENDENT_AMBULATORY_CARE_PROVIDER_SITE_OTHER): Payer: 59 | Admitting: Family Medicine

## 2022-06-03 ENCOUNTER — Encounter: Payer: Self-pay | Admitting: Family Medicine

## 2022-06-03 VITALS — BP 122/88 | HR 89 | Temp 98.1°F | Ht 68.0 in | Wt 243.6 lb

## 2022-06-03 DIAGNOSIS — Z131 Encounter for screening for diabetes mellitus: Secondary | ICD-10-CM

## 2022-06-03 DIAGNOSIS — Z79899 Other long term (current) drug therapy: Secondary | ICD-10-CM | POA: Diagnosis not present

## 2022-06-03 DIAGNOSIS — F988 Other specified behavioral and emotional disorders with onset usually occurring in childhood and adolescence: Secondary | ICD-10-CM | POA: Diagnosis not present

## 2022-06-03 DIAGNOSIS — I10 Essential (primary) hypertension: Secondary | ICD-10-CM

## 2022-06-03 DIAGNOSIS — Z6837 Body mass index (BMI) 37.0-37.9, adult: Secondary | ICD-10-CM

## 2022-06-03 DIAGNOSIS — Z0000 Encounter for general adult medical examination without abnormal findings: Secondary | ICD-10-CM | POA: Diagnosis not present

## 2022-06-03 LAB — CBC WITH DIFFERENTIAL/PLATELET
Basophils Absolute: 0.1 10*3/uL (ref 0.0–0.1)
Basophils Relative: 0.9 % (ref 0.0–3.0)
Eosinophils Absolute: 0.2 10*3/uL (ref 0.0–0.7)
Eosinophils Relative: 2.8 % (ref 0.0–5.0)
HCT: 38.7 % (ref 36.0–46.0)
Hemoglobin: 13.4 g/dL (ref 12.0–15.0)
Lymphocytes Relative: 47.7 % — ABNORMAL HIGH (ref 12.0–46.0)
Lymphs Abs: 3.1 10*3/uL (ref 0.7–4.0)
MCHC: 34.7 g/dL (ref 30.0–36.0)
MCV: 85.7 fl (ref 78.0–100.0)
Monocytes Absolute: 0.6 10*3/uL (ref 0.1–1.0)
Monocytes Relative: 10 % (ref 3.0–12.0)
Neutro Abs: 2.5 10*3/uL (ref 1.4–7.7)
Neutrophils Relative %: 38.6 % — ABNORMAL LOW (ref 43.0–77.0)
Platelets: 290 10*3/uL (ref 150.0–400.0)
RBC: 4.51 Mil/uL (ref 3.87–5.11)
RDW: 13.4 % (ref 11.5–15.5)
WBC: 6.5 10*3/uL (ref 4.0–10.5)

## 2022-06-03 LAB — COMPREHENSIVE METABOLIC PANEL
ALT: 17 U/L (ref 0–35)
AST: 18 U/L (ref 0–37)
Albumin: 4 g/dL (ref 3.5–5.2)
Alkaline Phosphatase: 70 U/L (ref 39–117)
BUN: 12 mg/dL (ref 6–23)
CO2: 30 mEq/L (ref 19–32)
Calcium: 9.2 mg/dL (ref 8.4–10.5)
Chloride: 104 mEq/L (ref 96–112)
Creatinine, Ser: 0.95 mg/dL (ref 0.40–1.20)
GFR: 70.17 mL/min (ref >60.00)
Glucose, Bld: 90 mg/dL (ref 70–99)
Potassium: 3.6 mEq/L (ref 3.5–5.1)
Sodium: 140 mEq/L (ref 135–145)
Total Bilirubin: 0.5 mg/dL (ref 0.2–1.2)
Total Protein: 7.3 g/dL (ref 6.0–8.3)

## 2022-06-03 LAB — LIPID PANEL
Cholesterol: 149 mg/dL (ref 0–200)
HDL: 48.8 mg/dL (ref >39.00)
LDL Cholesterol: 83 mg/dL (ref 0–99)
NonHDL: 99.88
Total CHOL/HDL Ratio: 3
Triglycerides: 85 mg/dL (ref 0.0–149.0)
VLDL: 17 mg/dL (ref 0.0–40.0)

## 2022-06-03 LAB — HEMOGLOBIN A1C: Hgb A1c MFr Bld: 5.6 % (ref 4.6–6.5)

## 2022-06-03 NOTE — Progress Notes (Signed)
Phone [phone]   Subjective:  Patient presents today for their annual physical. Chief complaint-noted.   See problem oriented charting- ROS- full  review of systems was completed and negative except for: hair loss actually improving with dermatology  The following were reviewed and entered/updated in epic: Past Medical History:  Diagnosis Date   Acid reflux 12/10/2010   hx of-diet controlled   Allergy    sesaonal allergies   ASTHMA, INTRINSIC 01/29/2009   uses inhaler PRN   Breast lesion 09/29/2011   Hypertension    on meds   Overweight(278.02) 12/10/2010   Patient Active Problem List   Diagnosis Date Noted   Attention deficit disorder (ADD) in adult 06/13/2014    Priority: High   Left knee pain 06/13/2014    Priority: Medium    HTN (hypertension) 07/17/2010    Priority: Medium    Stress headache 07/18/2017    Priority: Low   Left elbow pain 06/13/2014    Priority: Low   Herpes simplex 06/13/2014    Priority: Low   Chest pain, atypical 04/11/2011    Priority: Low   Intermittent memory loss 02/18/2011    Priority: Low   Obesity 12/10/2010    Priority: Low   Asthma 01/29/2009    Priority: Low   Past Surgical History:  Procedure Laterality Date   ELBOW FRACTURE SURGERY Left    INNER EAR SURGERY Left    TOTAL ABDOMINAL HYSTERECTOMY     fibroid uterus.    Family History  Problem Relation Age of Onset   Hypertension Mother    Hyperlipidemia Mother    Endometrial cancer Mother        stage I detected in 2023- did well with surgery   Heart failure Father        drug induced   Kidney disease Father        failure   Hypertension Brother    Breast cancer Maternal Grandmother    Breast cancer Paternal Grandmother    Colon polyps Neg Hx    Colon cancer Neg Hx    Esophageal cancer Neg Hx    Rectal cancer Neg Hx    Stomach cancer Neg Hx     Medications- reviewed and updated Current Outpatient Medications  Medication Sig Dispense Refill   acyclovir  (ZOVIRAX) 400 MG tablet Take 1 tablet (400 mg total) by mouth 5 (five) times daily for 5 days- take at first sign of cold sore. 30 tablet 2   acyclovir ointment (ZOVIRAX) 5 % Apply 1 application topically every 3 (three) hours. 30 g 1   albuterol (VENTOLIN HFA) 108 (90 Base) MCG/ACT inhaler Inhale 1-2 puffs into the lungs every 6 (six) hours as needed for wheezing or shortness of breath. 6.7 g 5   amphetamine-dextroamphetamine (ADDERALL XR) 20 MG 24 hr capsule Take 1 capsule (20 mg total) by mouth daily. 30 capsule 0   betamethasone, augmented, (DIPROLENE) 0.05 % lotion Apply 1 Application topically to scalp 3-4 times a week. Avoid applying to face. 60 mL 5   finasteride (PROSCAR) 5 MG tablet Take 1 tablet (5 mg total) by mouth daily. 90 tablet 3   hydrochlorothiazide (MICROZIDE) 12.5 MG capsule Take 1 capsule (12.5 mg total) by mouth daily. 30 capsule 5   LORATADINE PO Take 1 tablet by mouth as needed.     minoxidil (LONITEN) 2.5 MG tablet Take 0.5 tablets (1.25 mg total) by mouth daily. 45 tablet 3   Multiple Vitamin (MULTIVITAMIN) tablet Take 1 tablet by mouth  daily. Women's One-A-Day     No current facility-administered medications for this visit.    Allergies-reviewed and updated Allergies  Allergen Reactions   Lisinopril     angioedema    Social History   Social History Narrative   Single. 24 year son in school at [employer] in 2018- doing [employer] type work still wants to play basketball overseas.       Works for [employer] at [employer] bachelor at [employer]: enjoys going out to eat, travel    Objective  Objective:  BP 122/88   Pulse 89   Temp 98.1 F (36.7 C)   Ht 5\' 8"  (1.727 m)   Wt 243 lb 9.6 oz (110.5 kg)   LMP  (LMP Unknown)   SpO2 94%   BMI 37.04 kg/m  Gen: NAD, resting comfortably HEENT: Mucous membranes are moist. Oropharynx normal Neck: no thyromegaly CV: RRR no murmurs rubs or gallops Lungs: CTAB no crackles,  wheeze, rhonchi Abdomen: soft/nontender/nondistended/normal bowel sounds. No rebound or guarding.  Ext: no edema Skin: warm, dry Neuro: grossly normal, moves all extremities, PERRLA   Assessment and Plan   52 y.o. female presenting for annual physical.  Health Maintenance counseling: 1. Anticipatory guidance: Patient counseled regarding regular dental exams -q3-6 months, eye exams - reading glasses- trying to schedule,  avoiding smoking and second hand smoke , limiting alcohol to 1 beverage per day, no illicit drugs.   2. Risk factor reduction:  Advised patient of need for regular exercise and diet rich and fruits and vegetables to reduce risk of heart attack and stroke.  Exercise- not well recently but has had ups and downs-personal training has worked well for her in past and is reconsidering and also considering [facility].  Diet/weight management-took a few [facility] well classes. Weight stbale over last year- wants to cut down on sugar intake  Wt Readings from Last 3 Encounters:  06/03/22 243 lb 9.6 oz (110.5 kg)  05/26/22 240 lb (108.9 kg)  02/19/22 244 lb 6.4 oz (110.9 kg)  3. Immunizations/screenings/ancillary studies-up-to-date, holding off on further COVID shots  Immunization History  Administered Date(s) Administered   Influenza Whole 11/09/2018   Influenza,inj,Quad PF,6+ Mos 11/22/2020, 11/24/2021   Influenza-Unspecified 11/21/2013, 11/23/2014, 11/24/2015, 11/19/2016, 11/09/2019   Moderna Sars-Covid-2 Vaccination 03/06/2019, 10/21/2019   Td 10/02/2009   Tdap 06/12/2015  4. Cervical cancer screening-- not candidate as had total hysterectomy for fibroids including cervix   5. Breast cancer screening- breast exam declined (preferred self exams only)  and mammogram -08/21/2021 and will repeat yearly - already scheduled 6. Colon cancer screening - 05/02/20 with 3 year repeat planned  7. Skin cancer screening-low risk due to melanin content- several moles on breast more with time- no  worrisome changes.  Does see Dr. Darreld Mclean for hair loss with [facility]- very helpful.   advised regular sunscreen use. Otherwise Denies worrisome, changing, or new skin lesions.  8. Birth control/STD check- hysterectomy. 4 years in relationship-screened sexual transmitted infection last year- opts out this year 74. Osteoporosis screening at 9- will plan on this  -never smoker  Status of chronic or acute concerns   # Attention Deficit Disorder in Adult S:compliant with Adderall XR 20 mg daily.  Controlling symptoms reasonably well - mainly takes during the week - controlled substance contract 07/18/17  -UDS 05/04/2021 - original diagnosis by Dr. Dellia Cloud of [facility] A/P: attention deficit disorder reasonably well controlled- continue current medications and  update UDS per our standard protcol- but no concern   #hypertension S: compliant with Hydrochlorothiazide 12.5 mg -Likely angioedema on lisinopril -no checks at home BP Readings from Last 3 Encounters:  06/03/22 122/88  05/26/22 139/88  02/19/22 128/84  A/P: blood pressure above goal- prefer at least <135/85- she wants to do some home monitoring and update me in 1-2 weeks- if still high could increase hydrochlorothiazide to 25 mg as first step and potentially add amlodipine down the road if needed - also plans to start exercising again which can help -dash eating plan also helpful -add medications can raise blood pressure - do not feel now would be a good time to adjust these medications though with everything on her plate  # Asthma S:Using Albuterol HFA as needed- mainly before outdoor activities with pollen levels beign high this year A/P: reasonabel control- continue current medications   # Herpes Simplex S:Using  Acyclovir or Acyclovir-Hydrocortisone cream prn for cold sores.  A/P: not needing lately      Recommended follow up: No follow-ups on file. Future Appointments  Date Time Provider Department Center   08/26/2022  7:00 AM [facility] MM 3 [facility]  11/09/2022 10:00 AM Adely Facer, Aldine Contes, MD [facility]   Lab/Order associations:Not fasting   ICD-10-CM   1. Preventative health care  Z00.00     2. Primary hypertension  I10     3. Attention deficit disorder (ADD) in adult  F98.8     4. High risk medication use  Z79.899     5. Screening for diabetes mellitus  Z13.1     6. Class 2 severe obesity due to excess calories with serious comorbidity and body mass index (BMI) of 37.0 to 37.9 in adult  E66.01    Z68.37       No orders of the defined types were placed in this encounter.   Return precautions advised.  Tana Conch, MD

## 2022-06-03 NOTE — Patient Instructions (Addendum)
Consider vitamin D 1000 units per day or up to 2000 units- or could take multivitamin   blood pressure above goal- prefer at least <135/85- she wants to do some home monitoring and update me in 1-2 weeks- if still high could increase hydrochlorothiazide to 25 mg as first step and potentially add amlodipine down the road if needed - also plans to start exercising again which can help -dash eating plan also helpful  Can refill add medications for another 3 months if you reach out by call or mychart  Please stop by lab before you go If you have mychart- we will send your results within 3 business days of [place] receiving them.  If you do not have mychart- we will call you about results within 5 business days of [place] receiving them.  *please also note that you will see labs on mychart as soon as they post. I will later go in and write notes on them- will say "notes from Dr. Durene Cal"   Recommended follow up: Return in about 6 months (around 12/03/2022) for followup or sooner if needed.Schedule b4 you leave.

## 2022-06-05 LAB — DRUG MONITORING, PANEL 8 WITH CONFIRMATION, URINE
6 Acetylmorphine: NEGATIVE ng/mL (ref <10)
Alcohol Metabolites: NEGATIVE ng/mL (ref <500)
Amphetamine: 1199 ng/mL — ABNORMAL HIGH (ref <250)
Amphetamines: POSITIVE ng/mL — AB (ref <500)
Benzodiazepines: NEGATIVE ng/mL (ref <100)
Buprenorphine, Urine: NEGATIVE ng/mL (ref <5)
Cocaine Metabolite: NEGATIVE ng/mL (ref <150)
Creatinine: 119.7 mg/dL (ref >=20.0)
MDMA: NEGATIVE ng/mL (ref <500)
Marijuana Metabolite: NEGATIVE ng/mL (ref <20)
Methamphetamine: NEGATIVE ng/mL (ref <250)
Opiates: NEGATIVE ng/mL (ref <100)
Oxidant: NEGATIVE ug/mL (ref <200)
Oxycodone: NEGATIVE ng/mL (ref <100)
pH: 6.1 (ref 4.5–9.0)

## 2022-06-05 LAB — DM TEMPLATE

## 2022-06-22 ENCOUNTER — Other Ambulatory Visit: Payer: Self-pay | Admitting: Family Medicine

## 2022-06-22 DIAGNOSIS — Z1231 Encounter for screening mammogram for malignant neoplasm of breast: Secondary | ICD-10-CM

## 2022-07-02 ENCOUNTER — Encounter: Payer: Self-pay | Admitting: Family Medicine

## 2022-07-02 NOTE — Telephone Encounter (Signed)
See trend below.

## 2022-07-05 ENCOUNTER — Other Ambulatory Visit: Payer: Self-pay | Admitting: Family Medicine

## 2022-07-05 ENCOUNTER — Other Ambulatory Visit: Payer: Self-pay

## 2022-07-05 MED ORDER — AMLODIPINE BESYLATE 2.5 MG PO TABS
2.5000 mg | ORAL_TABLET | Freq: Every day | ORAL | 5 refills | Status: DC
  Filled 2022-07-05: qty 30, 30d supply, fill #0
  Filled 2022-07-23 – 2022-08-02 (×2): qty 30, 30d supply, fill #1
  Filled 2022-08-30: qty 30, 30d supply, fill #2
  Filled 2022-10-02: qty 30, 30d supply, fill #3
  Filled 2022-10-30: qty 30, 30d supply, fill #4
  Filled 2022-11-26: qty 30, 30d supply, fill #5

## 2022-07-06 ENCOUNTER — Other Ambulatory Visit: Payer: Self-pay

## 2022-07-23 ENCOUNTER — Other Ambulatory Visit: Payer: Self-pay

## 2022-07-26 ENCOUNTER — Encounter: Payer: Self-pay | Admitting: Pharmacist

## 2022-07-26 ENCOUNTER — Other Ambulatory Visit: Payer: Self-pay

## 2022-07-26 ENCOUNTER — Other Ambulatory Visit (HOSPITAL_COMMUNITY): Payer: Self-pay

## 2022-07-27 ENCOUNTER — Other Ambulatory Visit (HOSPITAL_BASED_OUTPATIENT_CLINIC_OR_DEPARTMENT_OTHER): Payer: Self-pay

## 2022-08-02 ENCOUNTER — Other Ambulatory Visit (HOSPITAL_COMMUNITY): Payer: Self-pay

## 2022-08-02 ENCOUNTER — Other Ambulatory Visit: Payer: Self-pay | Admitting: Family Medicine

## 2022-08-04 ENCOUNTER — Other Ambulatory Visit (HOSPITAL_COMMUNITY): Payer: Self-pay

## 2022-08-04 MED ORDER — AMPHETAMINE-DEXTROAMPHET ER 20 MG PO CP24
20.0000 mg | ORAL_CAPSULE | Freq: Every day | ORAL | 0 refills | Status: DC
  Filled 2022-08-04: qty 30, 30d supply, fill #0

## 2022-08-04 MED ORDER — AMPHETAMINE-DEXTROAMPHET ER 20 MG PO CP24
20.0000 mg | ORAL_CAPSULE | Freq: Every day | ORAL | 0 refills | Status: DC
  Filled 2022-08-04 – 2022-10-25 (×2): qty 30, 30d supply, fill #0

## 2022-08-04 MED ORDER — AMPHETAMINE-DEXTROAMPHET ER 20 MG PO CP24
20.0000 mg | ORAL_CAPSULE | Freq: Every day | ORAL | 0 refills | Status: DC
  Filled 2022-08-04: qty 30, 30d supply, fill #0
  Filled 2022-09-11: qty 6, 6d supply, fill #0
  Filled 2022-09-13: qty 24, 24d supply, fill #0

## 2022-08-06 ENCOUNTER — Other Ambulatory Visit (HOSPITAL_COMMUNITY): Payer: Self-pay

## 2022-08-09 ENCOUNTER — Other Ambulatory Visit (HOSPITAL_COMMUNITY): Payer: Self-pay

## 2022-08-26 ENCOUNTER — Ambulatory Visit
Admission: RE | Admit: 2022-08-26 | Discharge: 2022-08-26 | Disposition: A | Discharge status: Home / Self Care | Payer: 59 | Source: Ambulatory Visit

## 2022-08-26 DIAGNOSIS — Z1231 Encounter for screening mammogram for malignant neoplasm of breast: Secondary | ICD-10-CM

## 2022-08-30 ENCOUNTER — Other Ambulatory Visit: Payer: Self-pay

## 2022-08-31 ENCOUNTER — Other Ambulatory Visit (HOSPITAL_COMMUNITY): Payer: Self-pay

## 2022-09-13 ENCOUNTER — Other Ambulatory Visit (HOSPITAL_COMMUNITY): Payer: Self-pay

## 2022-09-14 ENCOUNTER — Other Ambulatory Visit (HOSPITAL_COMMUNITY): Payer: Self-pay

## 2022-10-02 ENCOUNTER — Other Ambulatory Visit (HOSPITAL_COMMUNITY): Payer: Self-pay

## 2022-10-13 ENCOUNTER — Other Ambulatory Visit (HOSPITAL_COMMUNITY): Payer: Self-pay

## 2022-10-25 ENCOUNTER — Other Ambulatory Visit (HOSPITAL_COMMUNITY): Payer: Self-pay

## 2022-10-26 ENCOUNTER — Other Ambulatory Visit: Payer: Self-pay

## 2022-10-26 ENCOUNTER — Other Ambulatory Visit (HOSPITAL_COMMUNITY): Payer: Self-pay

## 2022-10-30 ENCOUNTER — Other Ambulatory Visit (HOSPITAL_COMMUNITY): Payer: Self-pay

## 2022-11-09 ENCOUNTER — Encounter: Payer: 59 | Admitting: Family Medicine

## 2022-11-26 ENCOUNTER — Other Ambulatory Visit: Payer: Self-pay | Admitting: Family Medicine

## 2022-11-26 ENCOUNTER — Other Ambulatory Visit (HOSPITAL_COMMUNITY): Payer: Self-pay

## 2022-11-26 ENCOUNTER — Other Ambulatory Visit: Payer: Self-pay

## 2022-11-26 MED ORDER — HYDROCHLOROTHIAZIDE 12.5 MG PO CAPS
12.5000 mg | ORAL_CAPSULE | Freq: Every day | ORAL | 5 refills | Status: DC
  Filled 2022-11-26: qty 30, 30d supply, fill #0
  Filled 2023-01-01: qty 30, 30d supply, fill #1
  Filled 2023-02-04: qty 30, 30d supply, fill #2
  Filled 2023-03-15: qty 30, 30d supply, fill #3
  Filled 2023-04-16: qty 30, 30d supply, fill #4
  Filled 2023-05-15: qty 30, 30d supply, fill #5

## 2022-11-26 MED ORDER — AMPHETAMINE-DEXTROAMPHET ER 20 MG PO CP24
20.0000 mg | ORAL_CAPSULE | Freq: Every day | ORAL | 0 refills | Status: DC
  Filled 2022-12-03: qty 30, 30d supply, fill #0

## 2022-11-26 MED ORDER — AMPHETAMINE-DEXTROAMPHET ER 20 MG PO CP24
20.0000 mg | ORAL_CAPSULE | Freq: Every day | ORAL | 0 refills | Status: DC
  Filled 2022-11-26: qty 30, 30d supply, fill #0

## 2022-12-03 ENCOUNTER — Other Ambulatory Visit (HOSPITAL_COMMUNITY): Payer: Self-pay

## 2022-12-16 ENCOUNTER — Ambulatory Visit: Payer: 59 | Admitting: Family Medicine

## 2022-12-16 ENCOUNTER — Other Ambulatory Visit (HOSPITAL_COMMUNITY): Payer: Self-pay

## 2022-12-16 ENCOUNTER — Other Ambulatory Visit: Payer: Self-pay

## 2022-12-16 VITALS — BP 126/80 | HR 93 | Temp 98.1°F | Ht 68.0 in | Wt 247.0 lb

## 2022-12-16 DIAGNOSIS — F988 Other specified behavioral and emotional disorders with onset usually occurring in childhood and adolescence: Secondary | ICD-10-CM

## 2022-12-16 DIAGNOSIS — I10 Essential (primary) hypertension: Secondary | ICD-10-CM

## 2022-12-16 DIAGNOSIS — M533 Sacrococcygeal disorders, not elsewhere classified: Secondary | ICD-10-CM

## 2022-12-16 MED ORDER — AMPHETAMINE-DEXTROAMPHET ER 20 MG PO CP24
20.0000 mg | ORAL_CAPSULE | Freq: Every day | ORAL | 0 refills | Status: DC
  Filled 2022-12-16 – 2023-01-01 (×2): qty 30, 30d supply, fill #0

## 2022-12-16 MED ORDER — MELOXICAM 15 MG PO TABS
15.0000 mg | ORAL_TABLET | Freq: Every day | ORAL | 0 refills | Status: DC
  Filled 2022-12-16: qty 10, 10d supply, fill #0

## 2022-12-16 MED ORDER — AMPHETAMINE-DEXTROAMPHET ER 20 MG PO CP24
20.0000 mg | ORAL_CAPSULE | Freq: Every day | ORAL | 0 refills | Status: DC
  Filled 2022-12-16: qty 30, 30d supply, fill #0

## 2022-12-16 MED ORDER — AMPHETAMINE-DEXTROAMPHET ER 20 MG PO CP24
20.0000 mg | ORAL_CAPSULE | Freq: Every day | ORAL | 0 refills | Status: DC
  Filled 2022-12-16 – 2023-02-07 (×2): qty 30, 30d supply, fill #0

## 2022-12-16 NOTE — Progress Notes (Signed)
Phone [phone] In person visit   Subjective:   Candice Parker is a 52 y.o. year old very pleasant female patient who presents for/with See problem oriented charting Chief Complaint  Patient presents with   Medical Management of Chronic Issues   Hypertension   Back Pain    Pt c/o lower back pain x1 month that has been consistent.   Hemorrhoids    Pt c/o painful hemorrhoids    Past Medical History-  Patient Active Problem List   Diagnosis Date Noted   Attention deficit disorder (ADD) in adult 06/13/2014    Priority: High   Left knee pain 06/13/2014    Priority: Medium    HTN (hypertension) 07/17/2010    Priority: Medium    Stress headache 07/18/2017    Priority: Low   Left elbow pain 06/13/2014    Priority: Low   Herpes simplex 06/13/2014    Priority: Low   Chest pain, atypical 04/11/2011    Priority: Low   Intermittent memory loss 02/18/2011    Priority: Low   Obesity 12/10/2010    Priority: Low   Asthma 01/29/2009    Priority: Low    Medications- reviewed and updated Current Outpatient Medications  Medication Sig Dispense Refill   acyclovir (ZOVIRAX) 400 MG tablet Take 1 tablet (400 mg total) by mouth 5 (five) times daily for 5 days- take at first sign of cold sore. 30 tablet 2   acyclovir ointment (ZOVIRAX) 5 % Apply 1 application topically every 3 (three) hours. 30 g 1   albuterol (VENTOLIN HFA) 108 (90 Base) MCG/ACT inhaler Inhale 1-2 puffs into the lungs every 6 (six) hours as needed for wheezing or shortness of breath. 6.7 g 5   amLODipine (NORVASC) 2.5 MG tablet Take 1 tablet (2.5 mg total) by mouth daily. 30 tablet 5   betamethasone, augmented, (DIPROLENE) 0.05 % lotion Apply 1 Application topically to scalp 3-4 times a week. Avoid applying to face. 60 mL 5   finasteride (PROSCAR) 5 MG tablet Take 1 tablet (5 mg total) by mouth daily. 90 tablet 3   hydrochlorothiazide (MICROZIDE) 12.5 MG capsule Take 1 capsule (12.5 mg total) by mouth daily. 30 capsule  5   LORATADINE PO Take 1 tablet by mouth as needed.     meloxicam (MOBIC) 15 MG tablet Take 1 tablet (15 mg total) by mouth daily. 10 tablet 0   minoxidil (LONITEN) 2.5 MG tablet Take 0.5 tablets (1.25 mg total) by mouth daily. 45 tablet 3   Multiple Vitamin (MULTIVITAMIN) tablet Take 1 tablet by mouth daily. Women's One-A-Day     amphetamine-dextroamphetamine (ADDERALL XR) 20 MG 24 hr capsule Take 1 capsule (20 mg total) by mouth daily. 30 capsule 0   amphetamine-dextroamphetamine (ADDERALL XR) 20 MG 24 hr capsule Take 1 capsule (20 mg total) by mouth daily. 30 capsule 0   amphetamine-dextroamphetamine (ADDERALL XR) 20 MG 24 hr capsule Take 1 capsule (20 mg total) by mouth daily. May fill in 1 month 30 capsule 0   No current facility-administered medications for this visit.     Objective:  BP 126/80   Pulse 93   Temp 98.1 F (36.7 C)   Ht 5\' 8"  (1.727 m)   Wt 247 lb (112 kg)   LMP  (LMP Unknown)   SpO2 97%   BMI 37.56 kg/m  Gen: NAD, resting comfortably CV: RRR no murmurs rubs or gallops Lungs: CTAB no crackles, wheeze, rhonchi Ext: no edema Skin: warm, dry Musculoskeletal: pain directly over  coccyx- sits towards the side or preopped forward to offload pain. No weakness in legs. No pain in surrounding areas of the low back or buttocks.  -declines rectal exam    Assessment and Plan   #social update- up to 2 days a week [facility]- no worsening pain with that in her back. Doing some walking.   # coccyx pain S:pain started about a month ago. Thought maybe sat too long in one position. Worse with sitting- better with standing up.    She thought it might be hemorrhoids so tried sitz baths- she did have an external hemorrhoid (had internal ones noted on last colonoscopy). Took goli vitamins that are green and that has helped her be more regular and no straining. No prolonged sitting on toilet. Not doing best job with hydration. Trying to be less sedentary- 5000 steps. Stools are  neither firm or loose. Still has hemorrhoid despite that. Generic hemorrhoid cream didn't help much but hot bath still helped most. Donut pillow helps. No prior significant back issues or imaging  ROS-No saddle anesthesia, bladder incontinence, fecal incontinence, weakness in extremity, numbness or tingling in extremity (occasionally in feet). History negative for trauma, history of cancer, fever, chills, unintentional weight loss, recent bacterial infection, recent IV drug use, HIV, pain worse at night or while supine.   A/P:this looks like coccydynia (pain over tailbone)- she has done a good job with sitting on donut pillows and finding the right positions to offload pain.  -we discussed 10 day trial of meloxicam to further calm down pain -If no significant improvement in that time frame we can do x-rays and refer to sports medicine or orthopedics per her preference -she will update me in 10-14 days or sooner if worsens   For hemorrhoids- declines rectal exam today and wants to hold off on steroid suppositories for now- she may reach out later about this  # Attention Deficit Disorder in Adult S:compliant with Adderall XR 20 mg daily.  Controlling symptoms reasonably well - mainly takes during the week- doing well with breaks - controlled substance contract 07/18/17  -UDS 06/03/22 - original diagnosis [facility] Dr[facility]ia [facility] A/P: stable- continue current medicines  -3 months refills and she is always good about reaching out in 3 months and then keeping 6 month physical   #hypertension S: compliant with Hydrochlorothiazide 12.5 mg, amlodipine 2.5 mg -Likely angioedema on lisinopril BP Readings from Last 3 Encounters:  12/16/22 126/80  06/03/22 122/88  05/26/22 139/88  A/P: stable- continue current medicines  - I think she can tolerate short term meloxicam but she will monitor blood pressure at home on this  Recommended follow up: Return in about 6 months (around  06/15/2023) for physical or sooner if needed.Schedule b4 you leave.  Lab/Order associations:   ICD-10-CM   1. Attention deficit disorder (ADD) in adult  F98.8     2. Primary hypertension  I10     3. Coccydynia  M53.3       Meds ordered this encounter  Medications   amphetamine-dextroamphetamine (ADDERALL XR) 20 MG 24 hr capsule    Sig: Take 1 capsule (20 mg total) by mouth daily.    Dispense:  30 capsule    Refill:  0    May fill in 2 months.   amphetamine-dextroamphetamine (ADDERALL XR) 20 MG 24 hr capsule    Sig: Take 1 capsule (20 mg total) by mouth daily.    Dispense:  30 capsule  Refill:  0   amphetamine-dextroamphetamine (ADDERALL XR) 20 MG 24 hr capsule    Sig: Take 1 capsule (20 mg total) by mouth daily. May fill in 1 month    Dispense:  30 capsule    Refill:  0   meloxicam (MOBIC) 15 MG tablet    Sig: Take 1 tablet (15 mg total) by mouth daily.    Dispense:  10 tablet    Refill:  0    Return precautions advised.  Tana Conch, MD

## 2022-12-16 NOTE — Patient Instructions (Addendum)
Health Maintenance Due  Topic Date Due   Zoster Vaccines- Shingrix (1 of 2) Never done   :this looks like coccydynia (pain over tailbone)- she has done a good job with sitting on donut pillows and finding the right positions to offload pain.  -we discussed 10 day trial of meloxicam to further calm down pain -If no significant improvement in that time frame we can do x-rays and refer to sports medicine or orthopedics per her preference -she will update me in 10-14 days or sooner if worsens   Recommended follow up: Return in about 6 months (around 06/15/2023) for physical or sooner if needed.Schedule b4 you leave. -see [place] sooner if symptoms worsen

## 2022-12-17 ENCOUNTER — Other Ambulatory Visit (HOSPITAL_COMMUNITY): Payer: Self-pay

## 2022-12-27 DIAGNOSIS — L6681 Central centrifugal cicatricial alopecia: Secondary | ICD-10-CM | POA: Diagnosis not present

## 2022-12-27 DIAGNOSIS — L658 Other specified nonscarring hair loss: Secondary | ICD-10-CM | POA: Diagnosis not present

## 2022-12-28 ENCOUNTER — Other Ambulatory Visit (HOSPITAL_COMMUNITY): Payer: Self-pay

## 2022-12-28 ENCOUNTER — Other Ambulatory Visit: Payer: Self-pay

## 2022-12-28 MED ORDER — MINOXIDIL 2.5 MG PO TABS
1.2500 mg | ORAL_TABLET | Freq: Every day | ORAL | 3 refills | Status: DC
  Filled 2022-12-28 – 2023-02-28 (×3): qty 45, 90d supply, fill #0
  Filled 2023-04-16 – 2023-06-13 (×2): qty 45, 90d supply, fill #1
  Filled 2023-08-27: qty 45, 90d supply, fill #2
  Filled 2023-12-08: qty 45, 90d supply, fill #3

## 2022-12-28 MED ORDER — BETAMETHASONE DIPROPIONATE AUG 0.05 % EX LOTN
TOPICAL_LOTION | CUTANEOUS | 5 refills | Status: DC
  Filled 2022-12-28: qty 60, 30d supply, fill #0
  Filled 2023-02-04: qty 60, 30d supply, fill #1
  Filled 2023-03-15 – 2023-07-23 (×2): qty 60, 30d supply, fill #2

## 2022-12-28 MED ORDER — FINASTERIDE 5 MG PO TABS
5.0000 mg | ORAL_TABLET | Freq: Every day | ORAL | 3 refills | Status: DC
  Filled 2022-12-28 – 2023-02-04 (×2): qty 90, 90d supply, fill #0
  Filled 2023-04-16 – 2023-05-15 (×2): qty 90, 90d supply, fill #1
  Filled 2023-08-17: qty 90, 90d supply, fill #2
  Filled 2023-11-22: qty 90, 90d supply, fill #3

## 2023-01-01 ENCOUNTER — Other Ambulatory Visit: Payer: Self-pay | Admitting: Family Medicine

## 2023-01-01 ENCOUNTER — Other Ambulatory Visit (HOSPITAL_COMMUNITY): Payer: Self-pay

## 2023-01-02 ENCOUNTER — Other Ambulatory Visit: Payer: Self-pay

## 2023-01-03 ENCOUNTER — Other Ambulatory Visit: Payer: Self-pay

## 2023-01-03 MED ORDER — AMLODIPINE BESYLATE 2.5 MG PO TABS
2.5000 mg | ORAL_TABLET | Freq: Every day | ORAL | 5 refills | Status: DC
  Filled 2023-01-03: qty 30, 30d supply, fill #0
  Filled 2023-02-04: qty 30, 30d supply, fill #1
  Filled 2023-03-15: qty 30, 30d supply, fill #2
  Filled 2023-04-16: qty 30, 30d supply, fill #3
  Filled 2023-05-15: qty 30, 30d supply, fill #4
  Filled 2023-06-13: qty 30, 30d supply, fill #5

## 2023-02-04 ENCOUNTER — Other Ambulatory Visit: Payer: Self-pay | Admitting: Family Medicine

## 2023-02-05 ENCOUNTER — Other Ambulatory Visit (HOSPITAL_COMMUNITY): Payer: Self-pay

## 2023-02-06 ENCOUNTER — Other Ambulatory Visit: Payer: Self-pay

## 2023-02-07 ENCOUNTER — Other Ambulatory Visit (HOSPITAL_COMMUNITY): Payer: Self-pay

## 2023-02-07 ENCOUNTER — Other Ambulatory Visit: Payer: Self-pay

## 2023-02-07 NOTE — Telephone Encounter (Signed)
I suspect this is wrong prescription- this one is the one for 2 months- if she has been seen within 3 months- can load 3 months of prescription with all 3, refill now, 1 month and 2 months

## 2023-02-08 ENCOUNTER — Other Ambulatory Visit: Payer: Self-pay

## 2023-02-08 ENCOUNTER — Other Ambulatory Visit (HOSPITAL_COMMUNITY): Payer: Self-pay

## 2023-02-28 ENCOUNTER — Other Ambulatory Visit: Payer: Self-pay

## 2023-02-28 ENCOUNTER — Other Ambulatory Visit (HOSPITAL_COMMUNITY): Payer: Self-pay

## 2023-03-15 ENCOUNTER — Other Ambulatory Visit: Payer: Self-pay

## 2023-03-15 ENCOUNTER — Other Ambulatory Visit: Payer: Self-pay | Admitting: Family Medicine

## 2023-03-15 ENCOUNTER — Encounter (HOSPITAL_COMMUNITY): Payer: Self-pay

## 2023-03-15 ENCOUNTER — Other Ambulatory Visit (HOSPITAL_COMMUNITY): Payer: Self-pay

## 2023-03-15 MED ORDER — AMPHETAMINE-DEXTROAMPHET ER 20 MG PO CP24
20.0000 mg | ORAL_CAPSULE | Freq: Every day | ORAL | 0 refills | Status: DC
  Filled 2023-03-15: qty 30, 30d supply, fill #0

## 2023-03-21 ENCOUNTER — Ambulatory Visit: Payer: 59 | Admitting: Family Medicine

## 2023-03-21 ENCOUNTER — Encounter: Payer: Self-pay | Admitting: Family Medicine

## 2023-03-21 VITALS — BP 132/76 | HR 92 | Temp 97.5°F

## 2023-03-21 DIAGNOSIS — R1310 Dysphagia, unspecified: Secondary | ICD-10-CM | POA: Diagnosis not present

## 2023-03-21 DIAGNOSIS — I10 Essential (primary) hypertension: Secondary | ICD-10-CM | POA: Diagnosis not present

## 2023-03-21 DIAGNOSIS — R221 Localized swelling, mass and lump, neck: Secondary | ICD-10-CM

## 2023-03-21 NOTE — Patient Instructions (Addendum)
 Urgent ultrasound of head and neck- hoping to get this done this week  Also urgent referral to gastrointestinal - possible endoscopy   Recommended follow up: Return for next already scheduled visit or sooner if needed.let us  know if new or worsening symptoms particularly if progressive trouble swallowing

## 2023-03-21 NOTE — Progress Notes (Signed)
 Phone [phone] In person visit   Subjective:   Candice Parker is a 53 y.o. year old very pleasant female patient who presents for/with See problem oriented charting Chief Complaint  Patient presents with   Knot on throat     Center of throat, Lean head back can feel in throat drinking and/or eating a certain way feels like throat is closed   Past Medical History-  Patient Active Problem List   Diagnosis Date Noted   Attention deficit disorder (ADD) in adult 06/13/2014    Priority: High   Left knee pain 06/13/2014    Priority: Medium    HTN (hypertension) 07/17/2010    Priority: Medium    Stress headache 07/18/2017    Priority: Low   Left elbow pain 06/13/2014    Priority: Low   Herpes simplex 06/13/2014    Priority: Low   Chest pain, atypical 04/11/2011    Priority: Low   Intermittent memory loss 02/18/2011    Priority: Low   Obesity 12/10/2010    Priority: Low   Asthma 01/29/2009    Priority: Low    Medications- reviewed and updated Current Outpatient Medications  Medication Sig Dispense Refill   acyclovir  (ZOVIRAX ) 400 MG tablet Take 1 tablet (400 mg total) by mouth 5 (five) times daily for 5 days- take at first sign of cold sore. 30 tablet 2   acyclovir  ointment (ZOVIRAX ) 5 % Apply 1 application topically every 3 (three) hours. 30 g 1   albuterol  (VENTOLIN  HFA) 108 (90 Base) MCG/ACT inhaler Inhale 1-2 puffs into the lungs every 6 (six) hours as needed for wheezing or shortness of breath. 6.7 g 5   amLODipine  (NORVASC ) 2.5 MG tablet Take 1 tablet (2.5 mg total) by mouth daily. 30 tablet 5   amphetamine -dextroamphetamine  (ADDERALL  XR) 20 MG 24 hr capsule Take 1 capsule (20 mg total) by mouth daily. 30 capsule 0   betamethasone , augmented, (DIPROLENE ) 0.05 % lotion Apply to scalp 3-4 times per week. Not to face. 60 mL 5   finasteride  (PROSCAR ) 5 MG tablet Take 1 tablet (5 mg total) by mouth daily. 90 tablet 3   hydrochlorothiazide  (MICROZIDE ) 12.5 MG capsule Take  1 capsule (12.5 mg total) by mouth daily. 30 capsule 5   LORATADINE PO Take 1 tablet by mouth as needed.     minoxidil  (LONITEN ) 2.5 MG tablet Take 0.5 tablets (1.25 mg total) by mouth daily. 45 tablet 3   Multiple Vitamin (MULTIVITAMIN) tablet Take 1 tablet by mouth daily. Women's One-A-Day     amphetamine -dextroamphetamine  (ADDERALL  XR) 20 MG 24 hr capsule Take 1 capsule (20 mg total) by mouth daily. May fill in 1 month 30 capsule 0   No current facility-administered medications for this visit.     Objective:  BP 132/76 (BP Location: Left Arm, Patient Position: Sitting, Cuff Size: Large)   Pulse 92   Temp (!) 97.5 F (36.4 C) (Temporal)   LMP  (LMP Unknown)   SpO2 98%  Gen: NAD, resting comfortably Tympanic membrane normal, oropharynx normal, nasal turbinates normal No thyromegaly or cervical lymphadenopathy At central upper portion of thyroid  cartilage appears to be a soft mass approximately 3 x 3 cm CV: RRR no murmurs rubs or gallops Lungs: CTAB no crackles, wheeze, rhonchi Ext: no edema Skin: warm, dry     Assessment and Plan   # Knot on throat S:patient notes a midline protrusion on her neck particularly when leans her head back . Also in this position if drinking  or eating- notes difficulty swallowing- smaller bites seem to help with trouble swallowing. No pain. Has noted for about a month. No fever or chills.  Symptoms more noticeable with time- perhaps larger A/P: 54 year old female with nontender throat and seems to be at the upper central portion of thyroid  cartilage as well as dysphagia.  Will get stat neck ultrasound as well as stat referral to GI for possible endoscopy-primary concern is ruling out malignancy.  Has texture of potential lipoma but would be atypical area for the  #hypertension S: compliant with Hydrochlorothiazide  12.5 mg, amlodipine  2.5 mg -Likely angioedema on lisinopril  BP Readings from Last 3 Encounters:  03/21/23 132/76  12/16/22 126/80   06/03/22 122/88  A/P: Well-controlled-continue current medication  Recommended follow up: Return for next already scheduled visit or sooner if needed. Future Appointments  Date Time Provider Department Center  03/22/2023  3:15 PM [facility] US  4 [id number]  07/01/2023  7:00 AM [facility] 3 [facility]  09/02/2023  9:20 AM Loralei Radcliffe, Saverio Curling, MD [facility]    Lab/Order associations:   ICD-10-CM   1. Neck mass  R22.1 US  SOFT TISSUE HEAD & NECK (NON-THYROID )    2. Dysphagia, unspecified type  R13.10 Ambulatory referral to Gastroenterology    3. Primary hypertension  I10       No orders of the defined types were placed in this encounter.   Return precautions advised.  Clarisa Crooked, MD

## 2023-03-22 ENCOUNTER — Ambulatory Visit
Admission: RE | Admit: 2023-03-22 | Discharge: 2023-03-22 | Discharge status: Home / Self Care | Payer: 59 | Source: Ambulatory Visit | Attending: Family Medicine | Admitting: Family Medicine

## 2023-03-22 ENCOUNTER — Encounter: Payer: Self-pay | Admitting: Family Medicine

## 2023-03-22 DIAGNOSIS — Q892 Congenital malformations of other endocrine glands: Secondary | ICD-10-CM

## 2023-03-22 DIAGNOSIS — M8568 Other cyst of bone, other site: Secondary | ICD-10-CM | POA: Diagnosis not present

## 2023-03-22 DIAGNOSIS — R221 Localized swelling, mass and lump, neck: Secondary | ICD-10-CM

## 2023-03-23 NOTE — Telephone Encounter (Signed)
Awaiting referral dept response with appt date and time

## 2023-03-30 ENCOUNTER — Encounter: Payer: Self-pay | Admitting: Family Medicine

## 2023-04-16 ENCOUNTER — Other Ambulatory Visit: Payer: Self-pay | Admitting: Family Medicine

## 2023-04-16 ENCOUNTER — Other Ambulatory Visit (HOSPITAL_COMMUNITY): Payer: Self-pay

## 2023-04-18 ENCOUNTER — Other Ambulatory Visit (HOSPITAL_COMMUNITY): Payer: Self-pay

## 2023-04-18 ENCOUNTER — Other Ambulatory Visit: Payer: Self-pay

## 2023-04-18 MED ORDER — AMPHETAMINE-DEXTROAMPHET ER 20 MG PO CP24
20.0000 mg | ORAL_CAPSULE | Freq: Every day | ORAL | 0 refills | Status: DC
  Filled 2023-04-18: qty 30, 30d supply, fill #0

## 2023-05-15 ENCOUNTER — Other Ambulatory Visit: Payer: Self-pay | Admitting: Family Medicine

## 2023-05-16 ENCOUNTER — Other Ambulatory Visit (HOSPITAL_COMMUNITY): Payer: Self-pay

## 2023-05-16 ENCOUNTER — Other Ambulatory Visit: Payer: Self-pay

## 2023-05-16 MED ORDER — AMPHETAMINE-DEXTROAMPHET ER 20 MG PO CP24
20.0000 mg | ORAL_CAPSULE | Freq: Every day | ORAL | 0 refills | Status: DC
  Filled 2023-05-16: qty 30, 30d supply, fill #0

## 2023-05-26 ENCOUNTER — Ambulatory Visit (INDEPENDENT_AMBULATORY_CARE_PROVIDER_SITE_OTHER): Payer: 59 | Admitting: Otolaryngology

## 2023-05-26 ENCOUNTER — Other Ambulatory Visit (HOSPITAL_COMMUNITY): Payer: Self-pay

## 2023-05-26 ENCOUNTER — Encounter (INDEPENDENT_AMBULATORY_CARE_PROVIDER_SITE_OTHER): Payer: Self-pay | Admitting: Otolaryngology

## 2023-05-26 ENCOUNTER — Other Ambulatory Visit: Payer: Self-pay

## 2023-05-26 VITALS — BP 149/90 | HR 84 | Ht 68.0 in

## 2023-05-26 DIAGNOSIS — Q892 Congenital malformations of other endocrine glands: Secondary | ICD-10-CM

## 2023-05-26 DIAGNOSIS — R0981 Nasal congestion: Secondary | ICD-10-CM

## 2023-05-26 DIAGNOSIS — R0982 Postnasal drip: Secondary | ICD-10-CM

## 2023-05-26 DIAGNOSIS — K219 Gastro-esophageal reflux disease without esophagitis: Secondary | ICD-10-CM | POA: Diagnosis not present

## 2023-05-26 DIAGNOSIS — J351 Hypertrophy of tonsils: Secondary | ICD-10-CM

## 2023-05-26 DIAGNOSIS — R1310 Dysphagia, unspecified: Secondary | ICD-10-CM

## 2023-05-26 MED ORDER — FAMOTIDINE 20 MG PO TABS
20.0000 mg | ORAL_TABLET | Freq: Two times a day (BID) | ORAL | 3 refills | Status: DC
  Filled 2023-05-26: qty 30, 15d supply, fill #0

## 2023-05-26 MED ORDER — CETIRIZINE HCL 10 MG PO TABS
10.0000 mg | ORAL_TABLET | Freq: Every day | ORAL | 11 refills | Status: DC
  Filled 2023-05-26: qty 30, 30d supply, fill #0
  Filled 2023-08-17: qty 30, 30d supply, fill #1
  Filled 2023-10-27: qty 30, 30d supply, fill #2
  Filled 2023-11-22: qty 30, 30d supply, fill #3

## 2023-05-26 MED ORDER — FLUTICASONE PROPIONATE 50 MCG/ACT NA SUSP
2.0000 | Freq: Every day | NASAL | 6 refills | Status: DC
  Filled 2023-05-26: qty 16, 30d supply, fill #0

## 2023-05-26 NOTE — Progress Notes (Signed)
 ENT CONSULT:  Reason for Consult: thyroglossal duct cyst    HPI: Discussed the use of AI scribe software for clinical note transcription with the patient, who gave verbal consent to proceed.  History of Present Illness Candice Parker is a 53 year old female who presents with a neck mass.  She first noticed the neck cyst in January, 2025, which caused slight difficulty in swallowing and some irritation, but no pain. There were no associated cold symptoms at the time, and no antibiotics or other treatments were initially administered.  An ultrasound of the neck was ordered by her primary care doctor, and the results were communicated through her medical chart, and it showed thyroglossal duct cyst.   She has no history of thyroid disease and has never been told her thyroid function is abnormal. This is the first occurrence of neck swelling.   She has large tonsils, which were noted to be inflamed, causing some difficulty swallowing, particularly when eating. No frequent sore throats or history of sleep apnea.  Her past medical history includes high blood pressure and previous surgeries on her elbow and left ear. 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   Records Reviewed:  PCP office visit 03/21/23 # Knot on throat S:patient notes a midline protrusion on her neck particularly when leans her head back . Also in this position if drinking or eating- notes difficulty swallowing- smaller bites seem to help with trouble swallowing. No pain. Has noted for about a month. No fever or chills.  Symptoms more noticeable with time- perhaps larger A/P: 53 year old female with nontender throat and seems to be at the upper central portion of thyroid cartilage as well as dysphagia.  Will get stat neck ultrasound as well as stat referral to GI for possible endoscopy-primary concern is ruling out  malignancy.  Has texture of potential lipoma but would be atypical area for the    Past Medical History:  Diagnosis Date   Acid reflux 12/10/2010   hx of-diet controlled   Allergy    sesaonal allergies   ASTHMA, INTRINSIC 01/29/2009   uses inhaler PRN   Breast lesion 09/29/2011   Hypertension    on meds   Overweight(278.02) 12/10/2010    Past Surgical History:  Procedure Laterality Date   ELBOW FRACTURE SURGERY Left    INNER EAR SURGERY Left    TOTAL ABDOMINAL HYSTERECTOMY     fibroid uterus.    Family History  Problem Relation Age of Onset   Breast cancer Mother 20   Hypertension Mother    Hyperlipidemia Mother    Endometrial cancer Mother        stage I detected in 2023- did well with surgery   Heart failure Father        drug induced   Kidney disease Father        failure   Breast cancer Maternal Grandmother    Breast cancer Paternal Grandmother    Hypertension Brother    Colon polyps Neg Hx    Colon cancer Neg Hx    Esophageal cancer Neg Hx    Rectal cancer Neg Hx    Stomach cancer Neg Hx     Social History:  reports that she has never smoked. She has never used smokeless tobacco. She reports current alcohol use of about 3.0 standard drinks of alcohol per week. She reports that she does not use drugs.  Allergies:  Allergies  Allergen Reactions   Lisinopril     angioedema    Medications: I have reviewed the patient's current medications.  The PMH, PSH, Medications, Allergies, and SH were reviewed and updated.  ROS: Constitutional: Negative for fever, weight loss and weight gain. Cardiovascular: Negative for chest pain and dyspnea on exertion. Respiratory: Is not experiencing shortness of breath at rest. Gastrointestinal: Negative for nausea and vomiting. Neurological: Negative for headaches. Psychiatric: The patient is not nervous/anxious  Blood pressure (!) 149/90, pulse 84, height 5\' 8"  (1.727 m), SpO2 98%. Body mass index is 37.56  kg/m.  PHYSICAL EXAM:  Exam: General: Well-developed, well-nourished Respiratory Respiratory effort: Equal inspiration and expiration without stridor Cardiovascular Peripheral Vascular: Warm extremities with equal color/perfusion Eyes: No nystagmus with equal extraocular motion bilaterally Neuro/Psych/Balance: Patient oriented to person, place, and time; Appropriate mood and affect; Gait is intact with no imbalance; Cranial nerves I-XII are intact Head and Face Inspection: Normocephalic and atraumatic without mass or lesion Palpation: Facial skeleton intact without bony stepoffs Salivary Glands: No mass or tenderness Facial Strength: Facial motility symmetric and full bilaterally ENT Pinna: External ear intact and fully developed External canal: Canal is patent with intact skin Tympanic Membrane: Clear and mobile External Nose: No scar or anatomic deformity Internal Nose: Septum is deviated to the left slightly. No polyp, or purulence. Mucosal edema and erythema present.  Bilateral inferior turbinate hypertrophy.  Lips, Teeth, and gums: Mucosa and teeth intact and viable TMJ: No pain to palpation with full mobility Oral cavity/oropharynx: No erythema or exudate, no lesions present 4+ tonsils Nasopharynx: No mass or lesion with intact mucosa Hypopharynx: Intact mucosa without pooling of secretions Larynx Glottic: Full true vocal cord mobility without lesion or mass Supraglottic: Normal appearing epiglottis and AE folds Interarytenoid Space: Moderate pachydermia&edema Subglottic Space: Patent without lesion or edema Neck Neck and Trachea: Midline trachea with palpable round mass along the center near hyoid bone no other masses  Thyroid: No mass or nodularity Lymphatics: No lymphadenopathy  Procedure: Preoperative diagnosis: hx of neck mass and dysphagia   Postoperative diagnosis:   Same + tonsillar hypertrophy, GERD LPR  Procedure: Flexible fiberoptic  laryngoscopy  Surgeon: Kalicia Dufresne, MD  Anesthesia: Topical lidocaine and Afrin Complications: None Condition is stable throughout exam  Indications and consent:  The patient presents to the clinic with above symptoms. Indirect laryngoscopy view was incomplete. Thus it was recommended that they undergo a flexible fiberoptic laryngoscopy. All of the risks, benefits, and potential complications were reviewed with the patient preoperatively and verbal informed consent was obtained.  Procedure: The patient was seated upright in the clinic. Topical lidocaine and Afrin were applied to the nasal cavity. After adequate anesthesia had occurred, I then proceeded to pass the flexible telescope into the nasal cavity. The nasal cavity was patent without rhinorrhea or polyp. The nasopharynx was also patent without mass or lesion. The base of tongue was visualized and was normal. There were no signs of pooling of secretions in the piriform sinuses. The true vocal folds were mobile bilaterally. There were no signs of glottic or supraglottic mucosal lesion or mass. There was moderate interarytenoid pachydermia and post cricoid edema. The telescope was then slowly withdrawn and the patient tolerated the procedure throughout.    Studies Reviewed: Neck U/S 03/22/23 Complex cystic mass present within the midline of the neck superior to the hyoid bone. The structure measures 1.7 x 2.2 x 2.1 cm. No internal vascularity.   IMPRESSION: Complex midline cystic mass just  superior to the hyoid bone measures 1.7 x 2.2 x 2.1 cm. 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  Assessment/Plan: Encounter Diagnoses  Name Primary?   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 Surgical removal is necessary for diagnosis and to rule out presence of abnormal thyroid tissue in the cyst.  Flexible laryngoscopy with large tonsils crowding upper airway but no masses or lesions  along the base of the tongue.  - Order thyroid ultrasound to confirm presence of normal thyroid tissue in the neck. - Order CT neck with contrast for surgical planning and to rule out other abnormalities. - Discussed surgical removal of the cyst, tract, and hyoid bone, - Schedule follow-up after imaging to discuss results and potential surgery.  Enlarged tonsils Large tonsils causing swallowing difficulty. No frequent sore throats or sleep apnea diagnosis. Tonsillectomy considered if symptoms persist. - Prescribed Zyrtec, Flonase, and trial of Pepcid for postnasal drainage and reflux. - Advise gargling with salt water. - Discuss possibility of tonsillectomy if symptoms persist or worsen.  GERD LPR - Pepcid 20 mg BID  -  Reflux Gourmet after meals - diet and lifestyle changes to minimize GERD - Refer to [employer] blog for dietary and lifestyle modifications/reflux cook book  Chronic nasal congestion and post-nasal drainage Evidence of post-nasal drainage during flexible scope exam today, could be contributing to her sx - trial of Zyrtec 10 mg daily and Flonase 2 puffs b/l nares BID - consider nasal saline rinses    CT neck w/con Thyroid U/S  RTC after imaging to discuss results and surgery   Thank you for allowing me to participate in the care of this patient. Please do not hesitate to contact me with any questions or concerns.   Artice Last, MD Otolaryngology [facility] Phone: [phone] Fax: [phone]    05/26/2023, 10:00 AM

## 2023-06-13 ENCOUNTER — Other Ambulatory Visit: Payer: Self-pay | Admitting: Family Medicine

## 2023-06-13 ENCOUNTER — Other Ambulatory Visit (HOSPITAL_COMMUNITY): Payer: Self-pay

## 2023-06-13 ENCOUNTER — Other Ambulatory Visit: Payer: Self-pay

## 2023-06-13 MED ORDER — AMPHETAMINE-DEXTROAMPHET ER 20 MG PO CP24
20.0000 mg | ORAL_CAPSULE | Freq: Every day | ORAL | 0 refills | Status: DC
  Filled 2023-06-13 – 2023-07-23 (×2): qty 30, 30d supply, fill #0

## 2023-06-13 MED ORDER — HYDROCHLOROTHIAZIDE 12.5 MG PO CAPS
12.5000 mg | ORAL_CAPSULE | Freq: Every day | ORAL | 3 refills | Status: AC
  Filled 2023-06-13: qty 90, 90d supply, fill #0
  Filled 2023-09-22: qty 90, 90d supply, fill #1
  Filled 2023-12-08: qty 90, 90d supply, fill #2

## 2023-06-13 MED ORDER — AMPHETAMINE-DEXTROAMPHET ER 20 MG PO CP24
20.0000 mg | ORAL_CAPSULE | Freq: Every day | ORAL | 0 refills | Status: DC
  Filled 2023-06-13 (×2): qty 30, 30d supply, fill #0

## 2023-06-14 ENCOUNTER — Other Ambulatory Visit (HOSPITAL_COMMUNITY): Payer: Self-pay

## 2023-06-16 ENCOUNTER — Other Ambulatory Visit: Payer: Self-pay

## 2023-07-01 DIAGNOSIS — Z1231 Encounter for screening mammogram for malignant neoplasm of breast: Secondary | ICD-10-CM

## 2023-07-23 ENCOUNTER — Other Ambulatory Visit: Payer: Self-pay

## 2023-07-23 ENCOUNTER — Other Ambulatory Visit (HOSPITAL_BASED_OUTPATIENT_CLINIC_OR_DEPARTMENT_OTHER): Payer: Self-pay

## 2023-07-23 ENCOUNTER — Other Ambulatory Visit (HOSPITAL_COMMUNITY): Payer: Self-pay

## 2023-07-23 ENCOUNTER — Other Ambulatory Visit: Payer: Self-pay | Admitting: Family Medicine

## 2023-07-25 ENCOUNTER — Other Ambulatory Visit: Payer: Self-pay

## 2023-07-25 ENCOUNTER — Other Ambulatory Visit (HOSPITAL_COMMUNITY): Payer: Self-pay

## 2023-07-26 ENCOUNTER — Other Ambulatory Visit: Payer: Self-pay

## 2023-07-26 ENCOUNTER — Other Ambulatory Visit: Payer: Self-pay | Admitting: Family Medicine

## 2023-07-26 ENCOUNTER — Other Ambulatory Visit (HOSPITAL_COMMUNITY): Payer: Self-pay

## 2023-07-26 DIAGNOSIS — Z1231 Encounter for screening mammogram for malignant neoplasm of breast: Secondary | ICD-10-CM

## 2023-07-27 ENCOUNTER — Encounter

## 2023-07-28 ENCOUNTER — Other Ambulatory Visit: Payer: Self-pay

## 2023-07-28 ENCOUNTER — Other Ambulatory Visit (HOSPITAL_COMMUNITY): Payer: Self-pay

## 2023-07-28 MED ORDER — AMLODIPINE BESYLATE 2.5 MG PO TABS
2.5000 mg | ORAL_TABLET | Freq: Every day | ORAL | 5 refills | Status: DC
  Filled 2023-07-28: qty 30, 30d supply, fill #0
  Filled 2023-08-27: qty 30, 30d supply, fill #1
  Filled 2023-09-01: qty 30, 30d supply, fill #2
  Filled 2023-09-22: qty 90, 90d supply, fill #2
  Filled 2023-12-24: qty 30, 30d supply, fill #3

## 2023-07-29 ENCOUNTER — Ambulatory Visit (HOSPITAL_COMMUNITY)
Admission: RE | Admit: 2023-07-29 | Discharge: 2023-07-29 | Disposition: A | Discharge status: Home / Self Care | Source: Ambulatory Visit | Attending: Otolaryngology | Admitting: Otolaryngology

## 2023-07-29 DIAGNOSIS — M4802 Spinal stenosis, cervical region: Secondary | ICD-10-CM | POA: Diagnosis not present

## 2023-07-29 DIAGNOSIS — Q892 Congenital malformations of other endocrine glands: Secondary | ICD-10-CM

## 2023-07-29 DIAGNOSIS — E042 Nontoxic multinodular goiter: Secondary | ICD-10-CM | POA: Diagnosis not present

## 2023-07-29 DIAGNOSIS — R590 Localized enlarged lymph nodes: Secondary | ICD-10-CM | POA: Diagnosis not present

## 2023-07-29 DIAGNOSIS — J351 Hypertrophy of tonsils: Secondary | ICD-10-CM | POA: Diagnosis not present

## 2023-07-29 MED ORDER — IOHEXOL 350 MG/ML SOLN
75.0000 mL | Freq: Once | INTRAVENOUS | Status: AC | PRN
  Administered 2023-07-29: 60 mL via INTRAVENOUS

## 2023-08-02 ENCOUNTER — Other Ambulatory Visit (INDEPENDENT_AMBULATORY_CARE_PROVIDER_SITE_OTHER): Payer: Self-pay | Admitting: Otolaryngology

## 2023-08-02 ENCOUNTER — Ambulatory Visit (INDEPENDENT_AMBULATORY_CARE_PROVIDER_SITE_OTHER): Payer: Self-pay

## 2023-08-02 ENCOUNTER — Telehealth (INDEPENDENT_AMBULATORY_CARE_PROVIDER_SITE_OTHER): Payer: Self-pay

## 2023-08-02 DIAGNOSIS — E041 Nontoxic single thyroid nodule: Secondary | ICD-10-CM

## 2023-08-02 NOTE — Telephone Encounter (Signed)
 Spoke with patient regarding scan results, I informed the patient that she need to schedule a biopsy either before or after her upcoming surgery. Patient understood.

## 2023-08-02 NOTE — Telephone Encounter (Signed)
 Left a VM for patient to give us  a call back about her results.

## 2023-08-08 ENCOUNTER — Ambulatory Visit (INDEPENDENT_AMBULATORY_CARE_PROVIDER_SITE_OTHER): Admitting: Otolaryngology

## 2023-08-08 VITALS — BP 151/93 | HR 86

## 2023-08-08 DIAGNOSIS — Q892 Congenital malformations of other endocrine glands: Secondary | ICD-10-CM

## 2023-08-08 DIAGNOSIS — E041 Nontoxic single thyroid nodule: Secondary | ICD-10-CM

## 2023-08-08 NOTE — H&P (View-Only) (Signed)
 ENT Progress Note:   Update 08/08/2023  Discussed the use of AI scribe software for clinical note transcription with the patient, who gave verbal consent to proceed.  History of Present Illness Candice Parker is a 53 year old female who presents with thyroid  nodules and a cystic neck mass after CT neck and thyroid  U/S.  Thyroid  nodules were identified on an ultrasound, with one nodule requiring a biopsy. She is scheduled for a fine needle biopsy on Wednesday this week. A CT scan of the neck has been done 07/29/23.  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   Records Reviewed:  Initial Evaluation  Reason for Consult: thyroglossal duct cyst    HPI: Discussed the use of AI scribe software for clinical note transcription with the patient, who gave verbal consent to proceed.  History of Present Illness Candice Parker is a 53 year old female who presents with a neck mass.  She first noticed the neck cyst in January, 2025, which caused slight difficulty in swallowing and some irritation, but no pain. There were no associated cold symptoms at the time, and no antibiotics or other treatments were initially administered.  An ultrasound of the neck was ordered by her primary care doctor, and the results were communicated through her medical chart, and it showed thyroglossal duct cyst.   She has no history of thyroid  disease and has never been told her thyroid  function is abnormal. This is the first occurrence of neck swelling.   She has large tonsils, which were noted to be inflamed, causing some difficulty swallowing, particularly when eating. No frequent sore throats or history of sleep apnea.  Her past medical history includes high blood pressure and previous surgeries on her elbow and left ear. 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   Candice Parker is a 53 year old female who presents with thyroid  nodules and a cystic neck mass.  Thyroid  nodules were identified on an ultrasound, with one nodule requiring a biopsy. She is scheduled for a fine needle biopsy on Wednesday. A CT scan of the neck has not yet been interpreted.  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 She experiences fullness in her neck. She has not yet scheduled a surgery date, as she was waiting to discuss the findings and implications.  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   Records Reviewed:  PCP office visit 03/21/23 # Knot on throat S:patient notes a midline protrusion on her neck particularly when leans her head back . Also in this position if drinking or eating- notes difficulty swallowing- smaller bites seem to help with trouble swallowing. No pain. Has noted for about a month. No fever or chills.  Symptoms more noticeable with time- perhaps larger A/P: 53 year old female with nontender throat and seems to be at the upper central portion of thyroid  cartilage as well as dysphagia.  Will get stat neck ultrasound as well as stat referral to GI for possible endoscopy-primary concern is ruling out malignancy.  Has texture of potential lipoma but would be atypical area for the    Past Medical History:  Diagnosis Date   Acid reflux 12/10/2010   hx of-diet controlled  Allergy    sesaonal allergies   ASTHMA, INTRINSIC 01/29/2009   uses inhaler PRN   Breast lesion 09/29/2011   Hypertension    on meds   Overweight(278.02) 12/10/2010    Past Surgical History:  Procedure Laterality Date   ELBOW FRACTURE SURGERY Left    INNER EAR SURGERY Left    TOTAL ABDOMINAL HYSTERECTOMY     fibroid uterus.    Family History  Problem Relation Age of Onset   Breast cancer Mother 31    Hypertension Mother    Hyperlipidemia Mother    Endometrial cancer Mother        stage I detected in 2023- did well with surgery   Heart failure Father        drug induced   Kidney disease Father        failure   Breast cancer Maternal Grandmother    Breast cancer Paternal Grandmother    Hypertension Brother    Colon polyps Neg Hx    Colon cancer Neg Hx    Esophageal cancer Neg Hx    Rectal cancer Neg Hx    Stomach cancer Neg Hx     Social History:  reports that she has never smoked. She has never used smokeless tobacco. She reports current alcohol use of about 3.0 standard drinks of alcohol per week. She reports that she does not use drugs.  Allergies:  Allergies  Allergen Reactions   Lisinopril      angioedema    Medications: I have reviewed the patient's current medications.  The PMH, PSH, Medications, Allergies, and SH were reviewed and updated.  ROS: Constitutional: Negative for fever, weight loss and weight gain. Cardiovascular: Negative for chest pain and dyspnea on exertion. Respiratory: Is not experiencing shortness of breath at rest. Gastrointestinal: Negative for nausea and vomiting. Neurological: Negative for headaches. Psychiatric: The patient is not nervous/anxious  There were no vitals taken for this visit. There is no height or weight on file to calculate BMI.  PHYSICAL EXAM:  Exam: General: Well-developed, well-nourished Respiratory Respiratory effort: Equal inspiration and expiration without stridor Cardiovascular Peripheral Vascular: Warm extremities with equal color/perfusion Eyes: No nystagmus with equal extraocular motion bilaterally Neuro/Psych/Balance: Patient oriented to person, place, and time; Appropriate mood and affect; Gait is intact with no imbalance; Cranial nerves I-XII are intact Head and Face Inspection: Normocephalic and atraumatic without mass or lesion Palpation: Facial skeleton intact without bony stepoffs Salivary Glands: No  mass or tenderness Facial Strength: Facial motility symmetric and full bilaterally ENT Pinna: External ear intact and fully developed External canal: Canal is patent with intact skin Tympanic Membrane: Clear and mobile External Nose: No scar or anatomic deformity Lips, Teeth, and gums: Mucosa and teeth intact and viable Neck Neck and Trachea: Midline trachea with palpable round mass along the center near hyoid bone no other masses  Thyroid : No mass or nodularity Lymphatics: No lymphadenopathy  Studies Reviewed: Neck U/S 03/22/23 Complex cystic mass present within the midline of the neck superior to the hyoid bone. The structure measures 1.7 x 2.2 x 2.1 cm. No internal vascularity.   IMPRESSION: Complex midline cystic mass just superior to the hyoid bone measures 1.7 x 2.2 x 2.1 cm. 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  Thyroid  U/S 07/29/23 Narrative & Impression  CLINICAL DATA:  Other.  History of thyroglossal duct cyst   EXAM: THYROID  ULTRASOUND   TECHNIQUE: Ultrasound examination of the thyroid  gland and adjacent soft tissues was performed.   COMPARISON:  Prior thyroid  ultrasound 03/22/2023   FINDINGS: Parenchymal Echotexture: Moderately heterogenous   Isthmus: 0.5 cm   Right lobe: 4.5 x 2.1 x 1.9 cm   Left lobe: 5.0 x 2.2 x 1.5 cm   _________________________________________________________   Estimated total number of nodules >/= 1 cm: 3   Number of spongiform nodules >/=  2 cm not described below (TR1): 0   Number of mixed cystic and solid nodules >/= 1.5 cm not described below (TR2): 0   _________________________________________________________   Diffusely heterogeneous and mildly enlarged thyroid  gland containing nearly innumerable small cysts and nodules.   Nodule # 1: Solid hypoechoic nodule in the right lower gland measures 1.0 by 1.0 x 1.1 cm. Findings are consistent with TI-RADS category 4. *Given size (>/= 1 - 1.4 cm) and  appearance, a follow-up ultrasound in 1 year should be considered based on TI-RADS criteria.   Nodule # 2: Solid hypoechoic nodule in the left lower gland measures 1.7 x 1.4 x 1.3 cm. Findings are consistent with TI-RADS category 4. **Given size (>/= 1.5 cm) and appearance, fine needle aspiration of this moderately suspicious nodule should be considered based on TI-RADS criteria.   Nodule # 3: Simple cyst in the left mid gland measures 1.1 cm. TI-RADS category 1. This nodule does NOT meet TI-RADS criteria for biopsy or dedicated follow-up.   IMPRESSION: 1. Mildly enlarged, heterogeneous and multinodular thyroid  gland. 2. Nodule # 2 is a 1.7 cm TI-RADS category 4 nodule in the left lower gland and meets criteria to consider fine-needle aspiration biopsy. Biopsy is recommended. 3. Nodule # 1 is a 1.1 cm TI-RADS category 4 nodule in the right lower gland meets criteria for imaging surveillance. Recommend follow-up ultrasound in 1 year.   CT neck 07/29/23                                                                                                                                                                                                                Assessment/Plan: Encounter Diagnoses  Name Primary?   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 Surgical removal is necessary for diagnosis and to rule out presence of abnormal thyroid  tissue in the cyst.  Flexible laryngoscopy with large tonsils crowding upper airway but no masses or lesions  along the base of the tongue.  -  Order thyroid  ultrasound to confirm presence of normal thyroid  tissue in the neck. - Order CT neck with contrast for surgical planning and to rule out other abnormalities. - Discussed surgical removal of the cyst, tract, and  hyoid bone, - Schedule follow-up after imaging to discuss results and potential surgery.  Enlarged tonsils Large tonsils causing swallowing difficulty. No frequent sore throats or sleep apnea diagnosis. Tonsillectomy considered if symptoms persist. - Prescribed Zyrtec , Flonase , and trial of Pepcid  for postnasal drainage and reflux. - Advise gargling with salt water. - Discuss possibility of tonsillectomy if symptoms persist or worsen.  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 Awaiting radiology interpretation. - Request radiology interpretation of the neck scan. - Schedule surgery for cyst and partial hyoid bone removal to reduce recurrence. - Instruct to avoid sunlight on incision post-surgery and apply sunscreen after post-op visit.  Thyroid  nodule requiring biopsy Thyroid  nodule identified for biopsy. Biopsy results will guide management. - Proceed with fine needle biopsy of thyroid  nodule on Wednesday. - If benign, plan annual thyroid  ultrasound.  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 We discussed risks and benefits of surgery and she would like to proceed. We also discussed post-op care. Thyroid  U/S with thyroid  in normal anatomic location.  - schedule for surgery   Thyroid  nodule  1.7 cm TI-RADS category 4 nodule in the left,  she is already scheduled for FNA.  - Proceed with fine needle biopsy of thyroid  nodule on Wednesday. - If benign, plan annual thyroid  ultrasound.   Elena Larry, MD Otolaryngology [facility] Phone: [phone] Fax: [phone]    08/08/2023, 8:47 AM

## 2023-08-08 NOTE — Progress Notes (Signed)
 ENT Progress Note:   Update 08/08/2023  Discussed the use of AI scribe software for clinical note transcription with the patient, who gave verbal consent to proceed.  History of Present Illness Candice Parker is a 53 year old female who presents with thyroid  nodules and a cystic neck mass after CT neck and thyroid  U/S.  Thyroid  nodules were identified on an ultrasound, with one nodule requiring a biopsy. She is scheduled for a fine needle biopsy on Wednesday this week. A CT scan of the neck has been done 07/29/23.  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   Records Reviewed:  Initial Evaluation  Reason for Consult: thyroglossal duct cyst    HPI: Discussed the use of AI scribe software for clinical note transcription with the patient, who gave verbal consent to proceed.  History of Present Illness Candice Parker is a 53 year old female who presents with a neck mass.  She first noticed the neck cyst in January, 2025, which caused slight difficulty in swallowing and some irritation, but no pain. There were no associated cold symptoms at the time, and no antibiotics or other treatments were initially administered.  An ultrasound of the neck was ordered by her primary care doctor, and the results were communicated through her medical chart, and it showed thyroglossal duct cyst.   She has no history of thyroid  disease and has never been told her thyroid  function is abnormal. This is the first occurrence of neck swelling.   She has large tonsils, which were noted to be inflamed, causing some difficulty swallowing, particularly when eating. No frequent sore throats or history of sleep apnea.  Her past medical history includes high blood pressure and previous surgeries on her elbow and left ear. 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   Candice Parker is a 53 year old female who presents with thyroid  nodules and a cystic neck mass.  Thyroid  nodules were identified on an ultrasound, with one nodule requiring a biopsy. She is scheduled for a fine needle biopsy on Wednesday. A CT scan of the neck has not yet been interpreted.  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 She experiences fullness in her neck. She has not yet scheduled a surgery date, as she was waiting to discuss the findings and implications.  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   Records Reviewed:  PCP office visit 03/21/23 # Knot on throat S:patient notes a midline protrusion on her neck particularly when leans her head back . Also in this position if drinking or eating- notes difficulty swallowing- smaller bites seem to help with trouble swallowing. No pain. Has noted for about a month. No fever or chills.  Symptoms more noticeable with time- perhaps larger A/P: 53 year old female with nontender throat and seems to be at the upper central portion of thyroid  cartilage as well as dysphagia.  Will get stat neck ultrasound as well as stat referral to GI for possible endoscopy-primary concern is ruling out malignancy.  Has texture of potential lipoma but would be atypical area for the    Past Medical History:  Diagnosis Date   Acid reflux 12/10/2010   hx of-diet controlled  Allergy    sesaonal allergies   ASTHMA, INTRINSIC 01/29/2009   uses inhaler PRN   Breast lesion 09/29/2011   Hypertension    on meds   Overweight(278.02) 12/10/2010    Past Surgical History:  Procedure Laterality Date   ELBOW FRACTURE SURGERY Left    INNER EAR SURGERY Left    TOTAL ABDOMINAL HYSTERECTOMY     fibroid uterus.    Family History  Problem Relation Age of Onset   Breast cancer Mother 31    Hypertension Mother    Hyperlipidemia Mother    Endometrial cancer Mother        stage I detected in 2023- did well with surgery   Heart failure Father        drug induced   Kidney disease Father        failure   Breast cancer Maternal Grandmother    Breast cancer Paternal Grandmother    Hypertension Brother    Colon polyps Neg Hx    Colon cancer Neg Hx    Esophageal cancer Neg Hx    Rectal cancer Neg Hx    Stomach cancer Neg Hx     Social History:  reports that she has never smoked. She has never used smokeless tobacco. She reports current alcohol use of about 3.0 standard drinks of alcohol per week. She reports that she does not use drugs.  Allergies:  Allergies  Allergen Reactions   Lisinopril      angioedema    Medications: I have reviewed the patient's current medications.  The PMH, PSH, Medications, Allergies, and SH were reviewed and updated.  ROS: Constitutional: Negative for fever, weight loss and weight gain. Cardiovascular: Negative for chest pain and dyspnea on exertion. Respiratory: Is not experiencing shortness of breath at rest. Gastrointestinal: Negative for nausea and vomiting. Neurological: Negative for headaches. Psychiatric: The patient is not nervous/anxious  There were no vitals taken for this visit. There is no height or weight on file to calculate BMI.  PHYSICAL EXAM:  Exam: General: Well-developed, well-nourished Respiratory Respiratory effort: Equal inspiration and expiration without stridor Cardiovascular Peripheral Vascular: Warm extremities with equal color/perfusion Eyes: No nystagmus with equal extraocular motion bilaterally Neuro/Psych/Balance: Patient oriented to person, place, and time; Appropriate mood and affect; Gait is intact with no imbalance; Cranial nerves I-XII are intact Head and Face Inspection: Normocephalic and atraumatic without mass or lesion Palpation: Facial skeleton intact without bony stepoffs Salivary Glands: No  mass or tenderness Facial Strength: Facial motility symmetric and full bilaterally ENT Pinna: External ear intact and fully developed External canal: Canal is patent with intact skin Tympanic Membrane: Clear and mobile External Nose: No scar or anatomic deformity Lips, Teeth, and gums: Mucosa and teeth intact and viable Neck Neck and Trachea: Midline trachea with palpable round mass along the center near hyoid bone no other masses  Thyroid : No mass or nodularity Lymphatics: No lymphadenopathy  Studies Reviewed: Neck U/S 03/22/23 Complex cystic mass present within the midline of the neck superior to the hyoid bone. The structure measures 1.7 x 2.2 x 2.1 cm. No internal vascularity.   IMPRESSION: Complex midline cystic mass just superior to the hyoid bone measures 1.7 x 2.2 x 2.1 cm. 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  Thyroid  U/S 07/29/23 Narrative & Impression  CLINICAL DATA:  Other.  History of thyroglossal duct cyst   EXAM: THYROID  ULTRASOUND   TECHNIQUE: Ultrasound examination of the thyroid  gland and adjacent soft tissues was performed.   COMPARISON:  Prior thyroid  ultrasound 03/22/2023   FINDINGS: Parenchymal Echotexture: Moderately heterogenous   Isthmus: 0.5 cm   Right lobe: 4.5 x 2.1 x 1.9 cm   Left lobe: 5.0 x 2.2 x 1.5 cm   _________________________________________________________   Estimated total number of nodules >/= 1 cm: 3   Number of spongiform nodules >/=  2 cm not described below (TR1): 0   Number of mixed cystic and solid nodules >/= 1.5 cm not described below (TR2): 0   _________________________________________________________   Diffusely heterogeneous and mildly enlarged thyroid  gland containing nearly innumerable small cysts and nodules.   Nodule # 1: Solid hypoechoic nodule in the right lower gland measures 1.0 by 1.0 x 1.1 cm. Findings are consistent with TI-RADS category 4. *Given size (>/= 1 - 1.4 cm) and  appearance, a follow-up ultrasound in 1 year should be considered based on TI-RADS criteria.   Nodule # 2: Solid hypoechoic nodule in the left lower gland measures 1.7 x 1.4 x 1.3 cm. Findings are consistent with TI-RADS category 4. **Given size (>/= 1.5 cm) and appearance, fine needle aspiration of this moderately suspicious nodule should be considered based on TI-RADS criteria.   Nodule # 3: Simple cyst in the left mid gland measures 1.1 cm. TI-RADS category 1. This nodule does NOT meet TI-RADS criteria for biopsy or dedicated follow-up.   IMPRESSION: 1. Mildly enlarged, heterogeneous and multinodular thyroid  gland. 2. Nodule # 2 is a 1.7 cm TI-RADS category 4 nodule in the left lower gland and meets criteria to consider fine-needle aspiration biopsy. Biopsy is recommended. 3. Nodule # 1 is a 1.1 cm TI-RADS category 4 nodule in the right lower gland meets criteria for imaging surveillance. Recommend follow-up ultrasound in 1 year.   CT neck 07/29/23                                                                                                                                                                                                                Assessment/Plan: Encounter Diagnoses  Name Primary?   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 Surgical removal is necessary for diagnosis and to rule out presence of abnormal thyroid  tissue in the cyst.  Flexible laryngoscopy with large tonsils crowding upper airway but no masses or lesions  along the base of the tongue.  -  Order thyroid  ultrasound to confirm presence of normal thyroid  tissue in the neck. - Order CT neck with contrast for surgical planning and to rule out other abnormalities. - Discussed surgical removal of the cyst, tract, and  hyoid bone, - Schedule follow-up after imaging to discuss results and potential surgery.  Enlarged tonsils Large tonsils causing swallowing difficulty. No frequent sore throats or sleep apnea diagnosis. Tonsillectomy considered if symptoms persist. - Prescribed Zyrtec , Flonase , and trial of Pepcid  for postnasal drainage and reflux. - Advise gargling with salt water. - Discuss possibility of tonsillectomy if symptoms persist or worsen.  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 Awaiting radiology interpretation. - Request radiology interpretation of the neck scan. - Schedule surgery for cyst and partial hyoid bone removal to reduce recurrence. - Instruct to avoid sunlight on incision post-surgery and apply sunscreen after post-op visit.  Thyroid  nodule requiring biopsy Thyroid  nodule identified for biopsy. Biopsy results will guide management. - Proceed with fine needle biopsy of thyroid  nodule on Wednesday. - If benign, plan annual thyroid  ultrasound.  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 We discussed risks and benefits of surgery and she would like to proceed. We also discussed post-op care. Thyroid  U/S with thyroid  in normal anatomic location.  - schedule for surgery   Thyroid  nodule  1.7 cm TI-RADS category 4 nodule in the left,  she is already scheduled for FNA.  - Proceed with fine needle biopsy of thyroid  nodule on Wednesday. - If benign, plan annual thyroid  ultrasound.   Elena Larry, MD Otolaryngology [facility] Phone: [phone] Fax: [phone]    08/08/2023, 8:47 AM

## 2023-08-09 ENCOUNTER — Other Ambulatory Visit (INDEPENDENT_AMBULATORY_CARE_PROVIDER_SITE_OTHER): Payer: Self-pay | Admitting: Otolaryngology

## 2023-08-09 DIAGNOSIS — Q892 Congenital malformations of other endocrine glands: Secondary | ICD-10-CM

## 2023-08-10 ENCOUNTER — Other Ambulatory Visit (HOSPITAL_COMMUNITY)
Admission: RE | Admit: 2023-08-10 | Discharge: 2023-08-10 | Disposition: A | Discharge status: Home / Self Care | Source: Ambulatory Visit | Attending: Otolaryngology | Admitting: Otolaryngology

## 2023-08-10 ENCOUNTER — Ambulatory Visit
Admission: RE | Admit: 2023-08-10 | Discharge: 2023-08-10 | Disposition: A | Discharge status: Home / Self Care | Source: Ambulatory Visit | Attending: Otolaryngology

## 2023-08-10 DIAGNOSIS — E041 Nontoxic single thyroid nodule: Secondary | ICD-10-CM | POA: Diagnosis not present

## 2023-08-10 NOTE — Procedures (Signed)
 PROCEDURE SUMMARY:  Using direct ultrasound guidance, 5 passes were made using 25 g needles into the nodule within the left inferior lobe of the thyroid .   Ultrasound was used to confirm needle placements on all occasions.   EBL = trace  Specimens were sent to Pathology for analysis.  See procedure note under Imaging tab in Epic for full procedure details.  Lavanda JAYSON Jurist PA-C 08/10/2023 9:13 AM

## 2023-08-16 LAB — CYTOLOGY - NON PAP

## 2023-08-17 ENCOUNTER — Other Ambulatory Visit: Payer: Self-pay

## 2023-08-18 ENCOUNTER — Ambulatory Visit (INDEPENDENT_AMBULATORY_CARE_PROVIDER_SITE_OTHER): Payer: Self-pay

## 2023-08-18 NOTE — Telephone Encounter (Signed)
 Spoke with patient about biopsy results. Patient understood

## 2023-08-27 ENCOUNTER — Other Ambulatory Visit (HOSPITAL_COMMUNITY): Payer: Self-pay

## 2023-08-27 ENCOUNTER — Other Ambulatory Visit: Payer: Self-pay | Admitting: Family Medicine

## 2023-08-29 ENCOUNTER — Other Ambulatory Visit: Payer: Self-pay

## 2023-08-29 ENCOUNTER — Encounter (HOSPITAL_BASED_OUTPATIENT_CLINIC_OR_DEPARTMENT_OTHER): Payer: Self-pay

## 2023-08-29 MED ORDER — AMPHETAMINE-DEXTROAMPHET ER 20 MG PO CP24
20.0000 mg | ORAL_CAPSULE | Freq: Every day | ORAL | 0 refills | Status: DC
  Filled 2023-08-29: qty 30, 30d supply, fill #0

## 2023-08-29 MED ORDER — AMPHETAMINE-DEXTROAMPHET ER 20 MG PO CP24
20.0000 mg | ORAL_CAPSULE | Freq: Every day | ORAL | 0 refills | Status: DC
  Filled 2023-08-29 – 2023-10-27 (×2): qty 30, 30d supply, fill #0

## 2023-08-30 ENCOUNTER — Other Ambulatory Visit (HOSPITAL_COMMUNITY): Payer: Self-pay

## 2023-08-30 ENCOUNTER — Other Ambulatory Visit: Payer: Self-pay

## 2023-09-01 ENCOUNTER — Other Ambulatory Visit (HOSPITAL_COMMUNITY): Payer: Self-pay

## 2023-09-02 ENCOUNTER — Encounter: Payer: Self-pay | Admitting: Family Medicine

## 2023-09-02 ENCOUNTER — Ambulatory Visit: Payer: Self-pay | Admitting: Family Medicine

## 2023-09-02 ENCOUNTER — Ambulatory Visit (INDEPENDENT_AMBULATORY_CARE_PROVIDER_SITE_OTHER): Payer: 59 | Admitting: Family Medicine

## 2023-09-02 ENCOUNTER — Ambulatory Visit
Admission: RE | Admit: 2023-09-02 | Discharge: 2023-09-02 | Disposition: A | Discharge status: Home / Self Care | Source: Ambulatory Visit

## 2023-09-02 ENCOUNTER — Other Ambulatory Visit (HOSPITAL_COMMUNITY): Payer: Self-pay

## 2023-09-02 VITALS — BP 122/82 | HR 80 | Temp 97.7°F | Ht 68.5 in | Wt 249.8 lb

## 2023-09-02 DIAGNOSIS — Z79899 Other long term (current) drug therapy: Secondary | ICD-10-CM

## 2023-09-02 DIAGNOSIS — F988 Other specified behavioral and emotional disorders with onset usually occurring in childhood and adolescence: Secondary | ICD-10-CM | POA: Diagnosis not present

## 2023-09-02 DIAGNOSIS — Z0000 Encounter for general adult medical examination without abnormal findings: Secondary | ICD-10-CM

## 2023-09-02 DIAGNOSIS — I10 Essential (primary) hypertension: Secondary | ICD-10-CM | POA: Diagnosis not present

## 2023-09-02 DIAGNOSIS — E669 Obesity, unspecified: Secondary | ICD-10-CM | POA: Diagnosis not present

## 2023-09-02 DIAGNOSIS — Z1211 Encounter for screening for malignant neoplasm of colon: Secondary | ICD-10-CM

## 2023-09-02 DIAGNOSIS — F900 Attention-deficit hyperactivity disorder, predominantly inattentive type: Secondary | ICD-10-CM | POA: Diagnosis not present

## 2023-09-02 DIAGNOSIS — Z1231 Encounter for screening mammogram for malignant neoplasm of breast: Secondary | ICD-10-CM

## 2023-09-02 DIAGNOSIS — Z131 Encounter for screening for diabetes mellitus: Secondary | ICD-10-CM | POA: Diagnosis not present

## 2023-09-02 LAB — COMPREHENSIVE METABOLIC PANEL WITH GFR
ALT: 21 U/L (ref 0–35)
AST: 21 U/L (ref 0–37)
Albumin: 4 g/dL (ref 3.5–5.2)
Alkaline Phosphatase: 69 U/L (ref 39–117)
BUN: 12 mg/dL (ref 6–23)
CO2: 30 meq/L (ref 19–32)
Calcium: 9 mg/dL (ref 8.4–10.5)
Chloride: 105 meq/L (ref 96–112)
Creatinine, Ser: 0.81 mg/dL (ref 0.40–1.20)
GFR: 84.22 mL/min (ref >60.00)
Glucose, Bld: 89 mg/dL (ref 70–99)
Potassium: 3.3 meq/L — ABNORMAL LOW (ref 3.5–5.1)
Sodium: 143 meq/L (ref 135–145)
Total Bilirubin: 0.4 mg/dL (ref 0.2–1.2)
Total Protein: 7.5 g/dL (ref 6.0–8.3)

## 2023-09-02 LAB — LIPID PANEL
Cholesterol: 159 mg/dL (ref 0–200)
HDL: 49.1 mg/dL (ref >39.00)
LDL Cholesterol: 95 mg/dL (ref 0–99)
NonHDL: 110.05
Total CHOL/HDL Ratio: 3
Triglycerides: 77 mg/dL (ref 0.0–149.0)
VLDL: 15.4 mg/dL (ref 0.0–40.0)

## 2023-09-02 LAB — CBC WITH DIFFERENTIAL/PLATELET
Basophils Absolute: 0.1 K/uL (ref 0.0–0.1)
Basophils Relative: 1 % (ref 0.0–3.0)
Eosinophils Absolute: 0.2 K/uL (ref 0.0–0.7)
Eosinophils Relative: 3.7 % (ref 0.0–5.0)
HCT: 41 % (ref 36.0–46.0)
Hemoglobin: 13.8 g/dL (ref 12.0–15.0)
Lymphocytes Relative: 47.6 % — ABNORMAL HIGH (ref 12.0–46.0)
Lymphs Abs: 2.8 K/uL (ref 0.7–4.0)
MCHC: 33.8 g/dL (ref 30.0–36.0)
MCV: 85.6 fl (ref 78.0–100.0)
Monocytes Absolute: 0.4 K/uL (ref 0.1–1.0)
Monocytes Relative: 7.6 % (ref 3.0–12.0)
Neutro Abs: 2.3 K/uL (ref 1.4–7.7)
Neutrophils Relative %: 40.1 % — ABNORMAL LOW (ref 43.0–77.0)
Platelets: 302 K/uL (ref 150.0–400.0)
RBC: 4.79 Mil/uL (ref 3.87–5.11)
RDW: 13.9 % (ref 11.5–15.5)
WBC: 5.8 K/uL (ref 4.0–10.5)

## 2023-09-02 LAB — HEMOGLOBIN A1C: Hgb A1c MFr Bld: 5.8 % (ref 4.6–6.5)

## 2023-09-02 LAB — TSH: TSH: 1.02 u[IU]/mL (ref 0.35–5.50)

## 2023-09-02 MED ORDER — POTASSIUM CHLORIDE CRYS ER 20 MEQ PO TBCR
20.0000 meq | EXTENDED_RELEASE_TABLET | ORAL | 3 refills | Status: DC
  Filled 2023-09-02: qty 12, 84d supply, fill #0

## 2023-09-02 NOTE — Patient Instructions (Addendum)
 EKG today  Update dental exam  Please stop by lab before you go If you have mychart- we will send your results within 3 business days of us  receiving them.  If you do not have mychart- we will call you about results within 5 business days of us  receiving them.  *please also note that you will see labs on mychart as soon as they post. I will later go in and write notes on them- will say notes from Dr. Katrinka [facility] contact Please call to schedule visit and/or procedure IF you do not hear within a week Address: [address] [place], [place], [place] [address] Phone: [phone]   Recommended follow up: Return in about 6 months (around 03/04/2024) for followup or sooner if needed.Schedule b4 you leave.

## 2023-09-02 NOTE — Progress Notes (Signed)
 Phone [phone]   Subjective:  Patient presents today for their annual physical. Chief complaint-noted.   See problem oriented charting- ROS- full  review of systems was completed and negative Per full ROS sheet completed by patient except for topics noted under acute/chronic concerns   The following were reviewed and entered/updated in epic: Past Medical History:  Diagnosis Date   Acid reflux 12/10/2010   hx of-diet controlled   Allergy    sesaonal allergies   ASTHMA, INTRINSIC 01/29/2009   uses inhaler PRN   Breast lesion 09/29/2011   Hypertension    on meds   Overweight(278.02) 12/10/2010   Patient Active Problem List   Diagnosis Date Noted   Attention deficit disorder (ADD) in adult 06/13/2014    Priority: High   Left knee pain 06/13/2014    Priority: Medium    HTN (hypertension) 07/17/2010    Priority: Medium    Stress headache 07/18/2017    Priority: Low   Left elbow pain 06/13/2014    Priority: Low   Herpes simplex 06/13/2014    Priority: Low   Chest pain, atypical 04/11/2011    Priority: Low   Intermittent memory loss 02/18/2011    Priority: Low   Obesity 12/10/2010    Priority: Low   Asthma 01/29/2009    Priority: Low   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    Family History  Problem Relation Age of Onset   Breast cancer Mother 71   Hypertension Mother    Hyperlipidemia Mother    Endometrial cancer Mother        stage I detected in 2023- did well with surgery   Cancer Mother    Obesity Mother    Heart failure Father        drug induced   Kidney disease Father        failure   Drug abuse Father    Breast cancer Maternal Grandmother    Cancer Maternal Grandmother    Breast cancer Paternal  Grandmother    Alcohol abuse Paternal Grandmother    Cancer Paternal Grandmother    Hypertension Brother    Asthma Son    Cancer Paternal Aunt    Colon polyps Neg Hx    Colon cancer Neg Hx    Esophageal cancer Neg Hx    Rectal cancer Neg Hx    Stomach cancer Neg Hx     Medications- reviewed and updated Current Outpatient Medications  Medication Sig Dispense Refill   acyclovir  (ZOVIRAX ) 400 MG tablet Take 1 tablet (400 mg total) by mouth 5 (five) times daily for 5 days- take at first sign of cold sore. 30 tablet 2   acyclovir  ointment (ZOVIRAX ) 5 % Apply 1 application topically every 3 (three) hours. 30 g 1   albuterol  (VENTOLIN  HFA) 108 (90 Base) MCG/ACT inhaler Inhale 1-2 puffs into the lungs every 6 (six) hours as needed for wheezing or shortness of breath. 6.7 g 5   amLODipine  (NORVASC ) 2.5 MG tablet Take 1 tablet (2.5 mg total) by mouth daily. 30 tablet 5   amphetamine -dextroamphetamine  (ADDERALL  XR) 20 MG 24 hr capsule Take 1 capsule (20 mg total) by mouth daily. 30 capsule 0   betamethasone , augmented, (DIPROLENE ) 0.05 %  lotion Apply to scalp 3-4 times per week. Not to face. 60 mL 5   finasteride  (PROSCAR ) 5 MG tablet Take 1 tablet (5 mg total) by mouth daily. 90 tablet 3   hydrochlorothiazide  (MICROZIDE ) 12.5 MG capsule Take 1 capsule (12.5 mg total) by mouth daily. 90 capsule 3   LORATADINE PO Take 1 tablet by mouth as needed.     minoxidil  (LONITEN ) 2.5 MG tablet Take 0.5 tablets (1.25 mg total) by mouth daily. 45 tablet 3   Multiple Vitamin (MULTIVITAMIN) tablet Take 1 tablet by mouth daily. Women's One-A-Day     amphetamine -dextroamphetamine  (ADDERALL  XR) 20 MG 24 hr capsule Take 1 capsule (20 mg total) by mouth daily. May fill in 1 month 30 capsule 0   cetirizine  (ZYRTEC ) 10 MG tablet Take 1 tablet (10 mg total) by mouth daily. (Patient not taking: Reported on 09/02/2023) 30 tablet 11   fluticasone  (FLONASE ) 50 MCG/ACT nasal spray Place 2 sprays into both nostrils daily.  (Patient not taking: Reported on 09/02/2023) 16 g 6   No current facility-administered medications for this visit.    Allergies-reviewed and updated Allergies  Allergen Reactions   Lisinopril      angioedema    Social History   Social History Narrative   Single but long term boyfriend. 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 Oropharynx normal Neck: no thyromegaly- thyroglossal duct cyst noted CV: RRR no murmurs rubs or gallops Lungs: CTAB no crackles, wheeze, rhonchi Abdomen: soft/nontender/nondistended/normal bowel sounds. No rebound or guarding.  Ext: no edema Skin: warm, dry Neuro: grossly normal, moves all extremities, PERRLA  EKG: normal rhythm with rate 68, normal axis, normal intervals though PR borderline prolonged at 200 msec on my view, no hypertrophy, no st or t wave changes    Assessment and Plan   53 y.o. female presenting for annual physical.  Health Maintenance counseling: 1. Anticipatory guidance: Patient counseled regarding regular dental exams - advised q6 months, eye exams - some reading issues- might be worth updating exam,  avoiding smoking and second hand smoke , limiting alcohol to 1 beverage per day- 1 glass a wine a week , no illicit drugs .   2. Risk factor reduction:  Advised patient of need for regular exercise and diet rich and fruits and vegetables to reduce risk of heart attack and stroke.  Exercise- personal training helpful  in past -has considered- in November was doing sagwell 2 days a week and after heals form surgery encouraged to push to at least get back to that Diet/weight management-weight up 6 lbs from last year- stress related- some cravings- but doing a multivitamin helping with cravings. Spikes in appetite as adderrall weans out of symptoms. Could consider coaching through [facility] Wt Readings from Last 3 Encounters:  09/02/23 249 lb 12.8 oz (113.3 kg)  12/16/22 247 lb (112  kg)  06/03/22 243 lb 9.6 oz (110.5 kg)  3. Immunizations/screenings/ancillary studies- up to date as holding off on COVID and Prevnar 20  and shingrix  with upcoming surgery Immunization History  Administered Date(s) Administered   Influenza Whole 11/09/2018   Influenza,inj,Quad PF,6+ Mos 11/22/2020, 11/24/2021, 12/07/2022   Influenza-Unspecified 11/21/2013, 11/23/2014, 11/24/2015, 11/19/2016, 11/09/2019   Moderna Sars-Covid-2 Vaccination 03/06/2019, 10/21/2019   Td 10/02/2009   Tdap 06/12/2015  4. Cervical cancer screening-- not candidate as had total hysterectomy for fibroids including cervix    5. Breast cancer screening- breast exam declined (preferred self exams only)  and mammogram -just had this morning 6. Colon cancer screening - 05/02/20 with 3 year repeat planned - she is due but wants to focus on thyroglossal duct cyst first - will refer today 7. Skin cancer screening-low risk due to melanin content- several moles on breast more with time- no worrisome changes.  Does see Dr. Mollie for hair loss with [facility]- very helpful.   advised regular sunscreen use. Otherwise Denies worrisome, changing, or new skin lesions.  8. Birth control/STD check- hysterectomy. 5 years in relationship-abstinence on partners side 9. Osteoporosis screening at 32- will plan on this   -never smoker  Status of chronic or acute concerns   #EKG needed for upcoming surgery for thyroglossal duct cyst  # Attention Deficit Disorder in  Adult S:compliant with Adderall  XR 20 mg daily.  Controlling symptoms  - mainly takes during the week - controlled substance contract 07/18/17  -UDS 4/25/240 update today - original diagnosis by Dr. 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  No recent issues  A/P: doing well- continue to monitor   # Herpes Simplex S:Using  Acyclovir  or Acyclovir -Hydrocortisone  cream prn for cold sores.  A/P: continue as needed use   #hair loss- on finasteride  through [facility]  as well as minoxidil  orally- has gotten very expensive - she may ask me to continue these but I would need some guidance from Dr. 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 Future Appointments  Date Time Provider Department Center  09/19/2023  8:00 AM Okey Burns, MD [facility] None   Lab/Order associations: fasting   ICD-10-CM   1. Preventative health care  Z00.00     2. Attention deficit disorder (ADD) in adult  F98.8 Comprehensive metabolic panel with GFR    CBC with Differential/Platelet    DRUG MONITORING, PANEL 8 WITH CONFIRMATION, URINE    3. Primary hypertension  I10 Comprehensive metabolic panel with GFR    CBC with Differential/Platelet    Lipid panel    TSH    EKG 12-Lead    4. Screening for diabetes mellitus  Z13.1 Hemoglobin A1c    5. Obesity (BMI 30-39.9)  E66.9 Hemoglobin A1c    6. High risk medication use  Z79.899 DRUG  MONITORING, PANEL 8 WITH CONFIRMATION, URINE    7. Screen for colon cancer  Z12.11 Ambulatory referral to Gastroenterology      No orders of the  defined types were placed in this encounter.   Return precautions advised.  Garnette Lukes, MD

## 2023-09-03 ENCOUNTER — Other Ambulatory Visit (HOSPITAL_COMMUNITY): Payer: Self-pay

## 2023-09-04 LAB — DRUG MONITORING, PANEL 8 WITH CONFIRMATION, URINE
6 Acetylmorphine: NEGATIVE ng/mL (ref <10)
Alcohol Metabolites: NEGATIVE ng/mL (ref <500)
Amphetamine: 2676 ng/mL — ABNORMAL HIGH (ref <250)
Amphetamines: POSITIVE ng/mL — AB (ref <500)
Benzodiazepines: NEGATIVE ng/mL (ref <100)
Buprenorphine, Urine: NEGATIVE ng/mL (ref <5)
Cocaine Metabolite: NEGATIVE ng/mL (ref <150)
Creatinine: 132.6 mg/dL (ref >=20.0)
MDMA: NEGATIVE ng/mL (ref <500)
Marijuana Metabolite: NEGATIVE ng/mL (ref <20)
Methamphetamine: NEGATIVE ng/mL (ref <250)
Opiates: NEGATIVE ng/mL (ref <100)
Oxidant: NEGATIVE ug/mL (ref <200)
Oxycodone: NEGATIVE ng/mL (ref <100)
pH: 7.4 (ref 4.5–9.0)

## 2023-09-04 LAB — DM TEMPLATE

## 2023-09-05 ENCOUNTER — Other Ambulatory Visit: Payer: Self-pay

## 2023-09-05 ENCOUNTER — Ambulatory Visit (HOSPITAL_BASED_OUTPATIENT_CLINIC_OR_DEPARTMENT_OTHER): Admitting: Anesthesiology

## 2023-09-05 ENCOUNTER — Other Ambulatory Visit (HOSPITAL_COMMUNITY): Payer: Self-pay

## 2023-09-05 ENCOUNTER — Ambulatory Visit (HOSPITAL_BASED_OUTPATIENT_CLINIC_OR_DEPARTMENT_OTHER)
Admission: RE | Admit: 2023-09-05 | Discharge: 2023-09-05 | Disposition: A | Discharge status: Home / Self Care | Attending: Otolaryngology | Admitting: Otolaryngology

## 2023-09-05 ENCOUNTER — Encounter: Payer: Self-pay | Admitting: Pharmacist

## 2023-09-05 ENCOUNTER — Encounter (HOSPITAL_BASED_OUTPATIENT_CLINIC_OR_DEPARTMENT_OTHER): Payer: Self-pay

## 2023-09-05 ENCOUNTER — Encounter (HOSPITAL_BASED_OUTPATIENT_CLINIC_OR_DEPARTMENT_OTHER)
Admission: RE | Disposition: A | Discharge status: Home / Self Care | Payer: Self-pay | Source: Home / Self Care | Attending: Otolaryngology

## 2023-09-05 DIAGNOSIS — E042 Nontoxic multinodular goiter: Secondary | ICD-10-CM | POA: Insufficient documentation

## 2023-09-05 DIAGNOSIS — Z01818 Encounter for other preprocedural examination: Secondary | ICD-10-CM

## 2023-09-05 DIAGNOSIS — E669 Obesity, unspecified: Secondary | ICD-10-CM | POA: Insufficient documentation

## 2023-09-05 DIAGNOSIS — I10 Essential (primary) hypertension: Secondary | ICD-10-CM | POA: Diagnosis not present

## 2023-09-05 DIAGNOSIS — J351 Hypertrophy of tonsils: Secondary | ICD-10-CM | POA: Insufficient documentation

## 2023-09-05 DIAGNOSIS — Z6837 Body mass index (BMI) 37.0-37.9, adult: Secondary | ICD-10-CM | POA: Insufficient documentation

## 2023-09-05 DIAGNOSIS — Q892 Congenital malformations of other endocrine glands: Secondary | ICD-10-CM

## 2023-09-05 DIAGNOSIS — K219 Gastro-esophageal reflux disease without esophagitis: Secondary | ICD-10-CM | POA: Diagnosis not present

## 2023-09-05 DIAGNOSIS — R1310 Dysphagia, unspecified: Secondary | ICD-10-CM | POA: Insufficient documentation

## 2023-09-05 DIAGNOSIS — Z79899 Other long term (current) drug therapy: Secondary | ICD-10-CM | POA: Diagnosis not present

## 2023-09-05 HISTORY — PX: THYROGLOSSAL DUCT CYST: SHX297

## 2023-09-05 SURGERY — EXCISION, THYROGLOSSAL DUCT CYST
Anesthesia: General | Site: Neck

## 2023-09-05 MED ORDER — SODIUM CHLORIDE 0.9 % IV SOLN
3.0000 g | INTRAVENOUS | Status: AC
  Administered 2023-09-05: 3 g via INTRAVENOUS

## 2023-09-05 MED ORDER — AMOXICILLIN-POT CLAVULANATE 875-125 MG PO TABS
1.0000 | ORAL_TABLET | Freq: Two times a day (BID) | ORAL | 0 refills | Status: DC
  Filled 2023-09-05 (×2): qty 20, 10d supply, fill #0

## 2023-09-05 MED ORDER — DEXMEDETOMIDINE HCL IN NACL 80 MCG/20ML IV SOLN
INTRAVENOUS | Status: DC | PRN
  Administered 2023-09-05 (×2): 6 ug via INTRAVENOUS

## 2023-09-05 MED ORDER — FENTANYL CITRATE (PF) 100 MCG/2ML IJ SOLN
INTRAMUSCULAR | Status: AC
Start: 2023-09-05 — End: 2023-09-05
  Filled 2023-09-05: qty 2

## 2023-09-05 MED ORDER — ONDANSETRON HCL 4 MG/2ML IJ SOLN
INTRAMUSCULAR | Status: DC | PRN
  Administered 2023-09-05: 4 mg via INTRAVENOUS

## 2023-09-05 MED ORDER — OXYCODONE HCL 5 MG PO TABS
5.0000 mg | ORAL_TABLET | Freq: Three times a day (TID) | ORAL | 0 refills | Status: DC | PRN
  Filled 2023-09-05 (×2): qty 20, 7d supply, fill #0

## 2023-09-05 MED ORDER — LACTATED RINGERS IV SOLN: INTRAVENOUS | Status: DC | PRN

## 2023-09-05 MED ORDER — 0.9 % SODIUM CHLORIDE (POUR BTL) OPTIME
TOPICAL | Status: DC | PRN
  Administered 2023-09-05: 300 mL

## 2023-09-05 MED ORDER — SCOPOLAMINE 1 MG/3DAYS TD PT72
1.0000 | MEDICATED_PATCH | Freq: Once | TRANSDERMAL | Status: DC
  Administered 2023-09-05: 1.5 mg via TRANSDERMAL

## 2023-09-05 MED ORDER — PROPOFOL 10 MG/ML IV BOLUS
INTRAVENOUS | Status: DC | PRN
Start: 2023-09-05 — End: 2023-09-05
  Administered 2023-09-05: 170 ug via INTRAVENOUS

## 2023-09-05 MED ORDER — DEXAMETHASONE SODIUM PHOSPHATE 10 MG/ML IJ SOLN
INTRAMUSCULAR | Status: DC | PRN
  Administered 2023-09-05: 10 mg via INTRAVENOUS

## 2023-09-05 MED ORDER — ACETAMINOPHEN 500 MG PO TABS
ORAL_TABLET | ORAL | Status: AC
Start: 2023-09-05 — End: 2023-09-05
  Filled 2023-09-05: qty 2

## 2023-09-05 MED ORDER — SCOPOLAMINE 1 MG/3DAYS TD PT72
MEDICATED_PATCH | TRANSDERMAL | Status: AC
  Filled 2023-09-05: qty 1

## 2023-09-05 MED ORDER — LACTATED RINGERS IV SOLN: INTRAVENOUS | Status: DC

## 2023-09-05 MED ORDER — DEXAMETHASONE SODIUM PHOSPHATE 10 MG/ML IJ SOLN
INTRAMUSCULAR | Status: AC
  Filled 2023-09-05: qty 1

## 2023-09-05 MED ORDER — FENTANYL CITRATE (PF) 100 MCG/2ML IJ SOLN
INTRAMUSCULAR | Status: DC | PRN
  Administered 2023-09-05 (×2): 50 ug via INTRAVENOUS

## 2023-09-05 MED ORDER — SUGAMMADEX SODIUM 200 MG/2ML IV SOLN
INTRAVENOUS | Status: DC | PRN
  Administered 2023-09-05: 200 mg via INTRAVENOUS

## 2023-09-05 MED ORDER — MIDAZOLAM HCL 5 MG/5ML IJ SOLN
INTRAMUSCULAR | Status: DC | PRN
  Administered 2023-09-05: 2 mg via INTRAVENOUS

## 2023-09-05 MED ORDER — ONDANSETRON HCL 4 MG/2ML IJ SOLN
INTRAMUSCULAR | Status: AC
  Filled 2023-09-05: qty 2

## 2023-09-05 MED ORDER — IBUPROFEN 600 MG PO TABS
600.0000 mg | ORAL_TABLET | Freq: Four times a day (QID) | ORAL | 0 refills | Status: AC
  Filled 2023-09-05 (×2): qty 30, 8d supply, fill #0

## 2023-09-05 MED ORDER — ACETAMINOPHEN 500 MG PO TABS
500.0000 mg | ORAL_TABLET | Freq: Four times a day (QID) | ORAL | 0 refills | Status: AC
  Filled 2023-09-05 (×2): qty 30, 8d supply, fill #0

## 2023-09-05 MED ORDER — SODIUM CHLORIDE 0.9 % IV SOLN
INTRAVENOUS | Status: AC
  Filled 2023-09-05: qty 8

## 2023-09-05 MED ORDER — MIDAZOLAM HCL 2 MG/2ML IJ SOLN
INTRAMUSCULAR | Status: AC
  Filled 2023-09-05: qty 2

## 2023-09-05 MED ORDER — ACETAMINOPHEN 500 MG PO TABS
1000.0000 mg | ORAL_TABLET | Freq: Once | ORAL | Status: AC
  Administered 2023-09-05: 1000 mg via ORAL

## 2023-09-05 MED ORDER — LIDOCAINE-EPINEPHRINE 1 %-1:100000 IJ SOLN
INTRAMUSCULAR | Status: AC
  Filled 2023-09-05: qty 2

## 2023-09-05 MED ORDER — ONDANSETRON HCL 4 MG/2ML IJ SOLN: 4.0000 mg | Freq: Once | INTRAMUSCULAR | Status: DC | PRN

## 2023-09-05 MED ORDER — DEXMEDETOMIDINE HCL IN NACL 80 MCG/20ML IV SOLN
INTRAVENOUS | Status: AC
  Filled 2023-09-05: qty 20

## 2023-09-05 MED ORDER — LIDOCAINE-EPINEPHRINE 1 %-1:100000 IJ SOLN
INTRAMUSCULAR | Status: DC | PRN
  Administered 2023-09-05: 3 mL

## 2023-09-05 MED ORDER — LIDOCAINE 2% (20 MG/ML) 5 ML SYRINGE
INTRAMUSCULAR | Status: AC
  Filled 2023-09-05: qty 5

## 2023-09-05 MED ORDER — FENTANYL CITRATE (PF) 100 MCG/2ML IJ SOLN
25.0000 ug | INTRAMUSCULAR | Status: DC | PRN
  Administered 2023-09-05: 25 ug via INTRAVENOUS
  Administered 2023-09-05: 50 ug via INTRAVENOUS

## 2023-09-05 MED ORDER — SUGAMMADEX SODIUM 200 MG/2ML IV SOLN
INTRAVENOUS | Status: AC
  Filled 2023-09-05: qty 2

## 2023-09-05 MED ORDER — ROCURONIUM 10MG/ML (10ML) SYRINGE FOR MEDFUSION PUMP - OPTIME
INTRAVENOUS | Status: DC | PRN
  Administered 2023-09-05: 60 mg via INTRAVENOUS

## 2023-09-05 MED ORDER — AMISULPRIDE (ANTIEMETIC) 5 MG/2ML IV SOLN: 10.0000 mg | Freq: Once | INTRAVENOUS | Status: DC | PRN

## 2023-09-05 MED ORDER — ROCURONIUM BROMIDE 10 MG/ML (PF) SYRINGE
PREFILLED_SYRINGE | INTRAVENOUS | Status: AC
  Filled 2023-09-05: qty 10

## 2023-09-05 MED ORDER — LIDOCAINE 2% (20 MG/ML) 5 ML SYRINGE
INTRAMUSCULAR | Status: DC | PRN
  Administered 2023-09-05: 80 mg via INTRAVENOUS

## 2023-09-05 SURGICAL SUPPLY — 74 items
BAND RUBBER #18 3X1/16 STRL (MISCELLANEOUS) IMPLANT
BENZOIN TINCTURE PRP APPL 2/3 (GAUZE/BANDAGES/DRESSINGS) IMPLANT
BLADE SURG 15 STRL LF DISP TIS (BLADE) ×2 IMPLANT
CANISTER SUCT 1200ML W/VALVE (MISCELLANEOUS) ×1 IMPLANT
CLEANER CAUTERY TIP PAD (MISCELLANEOUS) IMPLANT
CLIP TI WIDE RED SMALL 6 (CLIP) ×1 IMPLANT
CORD BIPOLAR FORCEPS 12FT (ELECTRODE) ×1 IMPLANT
COVER BACK TABLE 60X90IN (DRAPES) ×1 IMPLANT
COVER MAYO STAND STRL (DRAPES) ×1 IMPLANT
DERMABOND ADVANCED .7 DNX12 (GAUZE/BANDAGES/DRESSINGS) ×1 IMPLANT
DRAIN PENROSE 12X.25 LTX STRL (MISCELLANEOUS) IMPLANT
DRAIN WOUND RND W/TROCAR (DRAIN) IMPLANT
DRAPE INCISE IOBAN 66X45 STRL (DRAPES) IMPLANT
DRAPE U-SHAPE 76X120 STRL (DRAPES) ×1 IMPLANT
DRSG EMULSION OIL 3X3 NADH (GAUZE/BANDAGES/DRESSINGS) IMPLANT
DRSG TEGADERM 2-3/8X2-3/4 SM (GAUZE/BANDAGES/DRESSINGS) ×1 IMPLANT
DRSG TEGADERM 4X4.75 (GAUZE/BANDAGES/DRESSINGS) IMPLANT
DRSG TELFA 3X8 NADH STRL (GAUZE/BANDAGES/DRESSINGS) ×1 IMPLANT
ELECT COATED BLADE 2.86 ST (ELECTRODE) ×1 IMPLANT
ELECT NDL BLADE 2-5/6 (NEEDLE) IMPLANT
ELECT NEEDLE BLADE 2-5/6 (NEEDLE) IMPLANT
ELECTRODE REM PT RTRN 9FT ADLT (ELECTROSURGICAL) ×1 IMPLANT
EVACUATOR SILICONE 100CC (DRAIN) IMPLANT
FORCEPS BIPOLAR SPETZLER 8 1.0 (NEUROSURGERY SUPPLIES) ×1 IMPLANT
GAUZE 4X4 16PLY ~~LOC~~+RFID DBL (SPONGE) IMPLANT
GAUZE SPONGE 4X4 12PLY STRL LF (GAUZE/BANDAGES/DRESSINGS) IMPLANT
GLOVE BIO SURGEON STRL SZ 6 (GLOVE) ×1 IMPLANT
GLOVE BIO SURGEON STRL SZ7.5 (GLOVE) IMPLANT
GLOVE BIOGEL PI IND STRL 7.0 (GLOVE) IMPLANT
GLOVE BIOGEL PI IND STRL 7.5 (GLOVE) IMPLANT
GLOVE SURG SS PI 7.0 STRL IVOR (GLOVE) IMPLANT
GOWN STRL REUS W/ TWL LRG LVL3 (GOWN DISPOSABLE) ×2 IMPLANT
GOWN STRL REUS W/ TWL XL LVL3 (GOWN DISPOSABLE) IMPLANT
HEMOSTAT SURGICEL .5X2 ABSORB (HEMOSTASIS) IMPLANT
HOOK RETRACT STAY BLUNT 12 (MISCELLANEOUS) IMPLANT
NDL PRECISIONGLIDE 27X1.5 (NEEDLE) ×1 IMPLANT
NEEDLE PRECISIONGLIDE 27X1.5 (NEEDLE) ×1 IMPLANT
NS IRRIG 1000ML POUR BTL (IV SOLUTION) ×1 IMPLANT
PACK BASIN DAY SURGERY FS (CUSTOM PROCEDURE TRAY) ×1 IMPLANT
PENCIL SMOKE EVACUATOR (MISCELLANEOUS) ×1 IMPLANT
PIN SAFETY STERILE (MISCELLANEOUS) ×1 IMPLANT
RETRACTOR STAY HOOK 5MM (MISCELLANEOUS) IMPLANT
SHEARS HARMONIC 9CM CVD (BLADE) IMPLANT
SHEET MEDIUM DRAPE 40X70 STRL (DRAPES) IMPLANT
SLEEVE SCD COMPRESS KNEE MED (STOCKING) ×1 IMPLANT
SPIKE FLUID TRANSFER (MISCELLANEOUS) IMPLANT
SPONGE INTESTINAL PEANUT (DISPOSABLE) ×1 IMPLANT
STAPLER SKIN PROX WIDE 3.9 (STAPLE) ×1 IMPLANT
STRIP CLOSURE SKIN 1/2X4 (GAUZE/BANDAGES/DRESSINGS) IMPLANT
STRIP CLOSURE SKIN 1/4X4 (GAUZE/BANDAGES/DRESSINGS) IMPLANT
SUCTION TUBE FRAZIER 10FR DISP (SUCTIONS) IMPLANT
SUT CHROMIC 3 0 PS 2 (SUTURE) IMPLANT
SUT ETHILON 3 0 PS 1 (SUTURE) IMPLANT
SUT ETHILON 5 0 PS 2 18 (SUTURE) IMPLANT
SUT MON AB 5-0 PS2 18 (SUTURE) ×1 IMPLANT
SUT NYLON ETHILON 5-0 P-3 1X18 (SUTURE) IMPLANT
SUT SILK 2 0 SH CR/8 (SUTURE) IMPLANT
SUT SILK 3 0 PS 1 (SUTURE) IMPLANT
SUT SILK 3 0 REEL (SUTURE) ×1 IMPLANT
SUT SILK 3 0 SH CR/8 (SUTURE) IMPLANT
SUT SILK 3-0 18XBRD TIE BLK (SUTURE) IMPLANT
SUT SILK 4 0 TIES 17X18 (SUTURE) ×1 IMPLANT
SUT VIC AB 3-0 SH 27X BRD (SUTURE) ×1 IMPLANT
SUT VIC AB 4-0 P-3 18XBRD (SUTURE) IMPLANT
SUT VIC AB 4-0 PS2 27 (SUTURE) ×1 IMPLANT
SUT VIC AB 5-0 P-3 18X BRD (SUTURE) IMPLANT
SUT VICRYL RAPID 5 0 P 3 (SUTURE) IMPLANT
SWAB COLLECTION DEVICE MRSA (MISCELLANEOUS) IMPLANT
SWAB CULTURE ESWAB REG 1ML (MISCELLANEOUS) IMPLANT
SYR BULB EAR ULCER 3OZ GRN STR (SYRINGE) ×1 IMPLANT
SYR CONTROL 10ML LL (SYRINGE) ×1 IMPLANT
TOWEL GREEN STERILE FF (TOWEL DISPOSABLE) ×2 IMPLANT
TRAY DSU PREP LF (CUSTOM PROCEDURE TRAY) ×1 IMPLANT
TUBE CONNECTING 20X1/4 (TUBING) ×1 IMPLANT

## 2023-09-05 NOTE — Op Note (Signed)
 OPERATIVE REPORT  Pre-operative diagnosis: Thyroglossal duct cyst  Post-operative diagnosis: Thyroglossal duct cyst   Surgeon: Elena Larry, MD  Assistants: note  Specimen:  Submental lymph node Thyroglossal duct cyst   Indication: A 53 year old female who presented with anterior neck mass and imaging consistent with thyroglossal duct cyst. The risks and benefits of excision were discussed and she elected to proceed.   Procedure: The patient was identified in the holding room, and informed consent was obtained.  The patient was then brought to the operating room and placed supine on the table. General anesthesia was induced and the patient was orotracheally intubated. A prep verification and time out were performed. Preoperative antibiotics were administered.  A shoulder roll was placed. The planned neck incision was marked and  infiltrated with 1% lidocaine  with 1:100,000 epinephrine .  The patient was then prepped and draped in the standard sterile fashion.   The incision was made through the skin and subcutaneous tissues with a 15 blade.  The platysma was divided and flaps elevated superiorly and inferiorly.  The neck mass was visualized and palpapated.  It was dissected with a combination of blunt and sharp dissection.  The cyst was released inferiorly leaving a cuff a strap muscle attached to the cyst where adherent.  The thyroid  notch of the thyroid  cartilage was identified and freed from the cyst. The dissection was carried superiorly to the hyoid bone.  Bovie cautery was used to detach the musculature from the insertion on the superior middle third of the hyoid bone.   The central third of the hyoid bone was removed.  The cyst, strap muscles, hyoid were removed en bloc and passed off the field as specimen. The wound bed was copiously irrigated and meticulous hemostasis was achieved.    A 1/4 penrose drain was placed at the tongue base defect and sutured in place with a 2-0 Silk  suture..   The wound was then closed in layers.  The strap muscles were reapproximated in the midline with interrupted 4-0 vicryl.  The platysma/deep dermal layer was closed with interrupted 4-0 Vicryl.  The skin was closed with 5-0 subcuticular Monocryl.  Dermabond was placed over the incision.   The patient was returned to the care of the anesthesia staff, awakened, and transferred to recovery room in good condition.

## 2023-09-05 NOTE — Anesthesia Postprocedure Evaluation (Signed)
 Anesthesia Post Note  Patient: Candice Parker  Procedure(s) Performed: EXCISION THYROGLOSSAL DUCT CYST (Neck)     Patient location during evaluation: PACU Anesthesia Type: General Level of consciousness: awake and alert Pain management: pain level controlled Vital Signs Assessment: post-procedure vital signs reviewed and stable Respiratory status: spontaneous breathing, nonlabored ventilation and respiratory function stable Cardiovascular status: blood pressure returned to baseline and stable Postop Assessment: no apparent nausea or vomiting Anesthetic complications: no   No notable events documented.  Last Vitals:  Vitals:   09/05/23 1124 09/05/23 1130  BP:  (!) 143/87  Pulse: 67 67  Resp: 12 11  Temp:    SpO2: 97% 99%    Last Pain:  Vitals:   09/05/23 1130  TempSrc:   PainSc: 4                  Garnette FORBES Skillern

## 2023-09-05 NOTE — Anesthesia Procedure Notes (Signed)
 Procedure Name: Intubation Date/Time: 09/05/2023 9:42 AM  Performed by: Denton Niels CROME, CRNAPre-anesthesia Checklist: Patient identified, Emergency Drugs available, Suction available and Patient being monitored Patient Re-evaluated:Patient Re-evaluated prior to induction Oxygen Delivery Method: Circle system utilized Preoxygenation: Pre-oxygenation with 100% oxygen Induction Type: IV induction Ventilation: Mask ventilation without difficulty Laryngoscope Size: Mac and 3 Grade View: Grade II Tube type: Oral Number of attempts: 1 Airway Equipment and Method: Stylet Placement Confirmation: ETT inserted through vocal cords under direct vision, positive ETCO2 and breath sounds checked- equal and bilateral Secured at: 22 cm Tube secured with: Tape Dental Injury: Teeth and Oropharynx as per pre-operative assessment

## 2023-09-05 NOTE — Discharge Instructions (Addendum)
 This post-operative instruction sheet is designed to help you after surgery and help answer any of the common questions you may have. It is not entirely comprehensive, so if you have any questions, do not hesitate to call the office 24 hours a day.  What to expect: - You will see swelling or bruising under the incision in a few days. This is usually greatest on the second or third day after the operation. You also may feel the sensation of swelling and/or firmness which can last for a month or more. - Most patients experience very little pain from the incision and may complain more about a sore throat from the breathing tube. You may experience neck stiffness or soreness in your shoulders, back or neck. - Some patients also notice minor changes in swallowing, which improve over time. - The skin just above and below your incision will feel numb. This will improve over several months though some patients may have a permanent decrease in sensation in this area. - Your voice may be slightly hoarse or weak after surgery. This is normal and does not mean there was damage to the nerves that make the vocal cords move. The breathing tube used during surgery often irritates the vocal cords. Your voice usually will go back to normal within several days to a few weeks.   Wound Care and Hygiene:  Hygiene - You may shower/bathe 24-48 hours after surgery.  Do not let the water from the shower stream hit the incision directly. Do not soak incision until you are seen in follow up.  Incision care: - A special skin glue called Dermabond with Steri-strips is used to close and cover your incision. It is to stay in place for 10 to 14 days and wears off on its own. The glue is waterproof but you should not soak your incision or take a tub bath for 2 weeks because it could loosen the glue too soon. Do not pick or peel the glue off your skin. You should remove your post-operative dressing the day after your surgery. - You do  not need to cover the incision with saran wrap to shower since the dressing is waterproof. - Do not apply ointments or powders to the incision. - No further care is needed unless you have a problem. - The incision will be raised and red, but will eventually fade over the course of a year. - All incisions are sensitive to sunlight. For one year after surgery, you should use sunscreen when outdoors for long periods to prevent darkening of the scar area.  Nutrition after Surgery: - Start your regular diet today. No dietary restrictions after this  Activity: - Limit your activity for 24 hours. - Avoid vigorous physical activity while the stitches are in place - this includes heavy lifting, running, and other sporting activities for at least 2 weeks. Avoid activities that pull or stretch on the area with stitches. - Sleep with head or surgery site elevated using several pillows when possible.   Medications: - Pain Medication - A prescription for pain medicine Oxycodone  will be sent home with you. Do not drive while taking prescription pain medicine (narcotic pain medication) and only take it if you need it. Eat when taking pain medicines to avoid nausea. Watch for constipation. Eat plenty of fruits, vegetables, juices, and drink 6 to 8 glasses of water each day. - Take scheduled Tylenol  and Motrin  every 6  hrs staggered 3 hrs apart from each other. Follow the dose  as label directs. If you still having significant pain, take Oxycodone .  - Take an over the counter stool softeners twice daily while taking the narcotic pain medicine to prevent constipation.   Anesthesia Precautions & Expectations: - After anesthesia, rest for 24 hours.  Do not drive, drink alcoholic beverages or make any important decisions during this time.   - General anesthesia may cause a sore throat, jaw discomfort or muscle aches.  These symptoms can last for one or two days.  Call Dr Vallery office if you experience any  of the following: - chest pain or shortness of breath - persistent fever of 100.5  - nausea/vomiting - neck swelling or noisy breathing  - numbness of tingling in your fingertips or around your lips  - significant redness or purulent drainage abound the neck incision    Post Anesthesia Home Care Instructions  Activity: Get plenty of rest for the remainder of the day. A responsible individual must stay with you for 24 hours following the procedure.  For the next 24 hours, DO NOT: -Drive a car -Advertising copywriter -Drink alcoholic beverages -Take any medication unless instructed by your physician -Make any legal decisions or sign important papers.  Meals: Start with liquid foods such as gelatin or soup. Progress to regular foods as tolerated. Avoid greasy, spicy, heavy foods. If nausea and/or vomiting occur, drink only clear liquids until the nausea and/or vomiting subsides. Call your physician if vomiting continues.  Special Instructions/Symptoms: Your throat may feel dry or sore from the anesthesia or the breathing tube placed in your throat during surgery. If this causes discomfort, gargle with warm salt water. The discomfort should disappear within 24 hours.  If you had a scopolamine  patch placed behind your ear for the management of post- operative nausea and/or vomiting:  1. The medication in the patch is effective for 72 hours, after which it should be removed.  Wrap patch in a tissue and discard in the trash. Wash hands thoroughly with soap and water. 2. You may remove the patch earlier than 72 hours if you experience unpleasant side effects which may include dry mouth, dizziness or visual disturbances. 3. Avoid touching the patch. Wash your hands with soap and water after contact with the patch.  No tylenol  until after 3pm today.

## 2023-09-05 NOTE — Interval H&P Note (Signed)
 History and Physical Interval Note:  09/05/2023 9:09 AM  Candice Parker  has presented today for surgery, with the diagnosis of Thyroglossal duct cyst.  The various methods of treatment have been discussed with the patient and family. After consideration of risks, benefits and other options for treatment, the patient has consented to  Procedure(s): EXCISION, THYROGLOSSAL DUCT CYST (N/A) as a surgical intervention.  The patient's history has been reviewed, patient examined, no change in status, stable for surgery.  I have reviewed the patient's chart and labs.  Questions were answered to the patient's satisfaction.     Shadee Rathod

## 2023-09-05 NOTE — Transfer of Care (Signed)
 Immediate Anesthesia Transfer of Care Note  Patient: Candice Parker  Procedure(s) Performed: EXCISION THYROGLOSSAL DUCT CYST (Neck)  Patient Location: PACU  Anesthesia Type:General  Level of Consciousness: awake, patient cooperative, and responds to stimulation  Airway & Oxygen Therapy: Patient Spontanous Breathing and Patient connected to face mask oxygen  Post-op Assessment: Report given to RN and Post -op Vital signs reviewed and stable  Post vital signs: Reviewed and stable  Last Vitals:  Vitals Value Taken Time  BP 160/83 09/05/23 11:04  Temp 36.4 C 09/05/23 11:04  Pulse 69 09/05/23 11:07  Resp 12 09/05/23 11:07  SpO2 99 % 09/05/23 11:07  Vitals shown include unfiled device data.  Last Pain:  Vitals:   09/05/23 0855  TempSrc: Tympanic  PainSc: 0-No pain      Patients Stated Pain Goal: 8 (09/05/23 0855)  Complications: No notable events documented.

## 2023-09-05 NOTE — Anesthesia Preprocedure Evaluation (Signed)
 Anesthesia Evaluation  Patient identified by MRN, date of birth, ID band Patient awake    Reviewed: Allergy & Precautions, NPO status , Patient's Chart, lab work & pertinent test results  Airway Mallampati: II  TM Distance: >3 FB Neck ROM: Full    Dental  (+) Teeth Intact, Dental Advisory Given   Pulmonary asthma    Pulmonary exam normal breath sounds clear to auscultation       Cardiovascular hypertension, Pt. on medications Normal cardiovascular exam Rhythm:Regular Rate:Normal     Neuro/Psych  Headaches    GI/Hepatic Neg liver ROS,GERD  ,,  Endo/Other  Obesity   Renal/GU negative Renal ROS     Musculoskeletal negative musculoskeletal ROS (+)    Abdominal   Peds  (+) ATTENTION DEFICIT DISORDER WITHOUT HYPERACTIVITY Hematology negative hematology ROS (+)   Anesthesia Other Findings Day of surgery medications reviewed with the patient.  Reproductive/Obstetrics                              Anesthesia Physical Anesthesia Plan  ASA: 2  Anesthesia Plan: General   Post-op Pain Management: Tylenol  PO (pre-op)*   Induction: Intravenous  PONV Risk Score and Plan: 4 or greater and Scopolamine  patch - Pre-op, Midazolam , Dexamethasone  and Ondansetron   Airway Management Planned: Oral ETT  Additional Equipment:   Intra-op Plan:   Post-operative Plan: Extubation in OR  Informed Consent: I have reviewed the patients History and Physical, chart, labs and discussed the procedure including the risks, benefits and alternatives for the proposed anesthesia with the patient or authorized representative who has indicated his/her understanding and acceptance.     Dental advisory given  Plan Discussed with: CRNA  Anesthesia Plan Comments:         Anesthesia Quick Evaluation

## 2023-09-06 ENCOUNTER — Encounter (HOSPITAL_BASED_OUTPATIENT_CLINIC_OR_DEPARTMENT_OTHER): Payer: Self-pay | Admitting: Otolaryngology

## 2023-09-07 ENCOUNTER — Encounter (INDEPENDENT_AMBULATORY_CARE_PROVIDER_SITE_OTHER): Payer: Self-pay | Admitting: Otolaryngology

## 2023-09-07 ENCOUNTER — Ambulatory Visit (INDEPENDENT_AMBULATORY_CARE_PROVIDER_SITE_OTHER): Admitting: Otolaryngology

## 2023-09-07 VITALS — BP 148/83 | HR 78

## 2023-09-07 DIAGNOSIS — Q892 Congenital malformations of other endocrine glands: Secondary | ICD-10-CM

## 2023-09-07 DIAGNOSIS — Z9889 Other specified postprocedural states: Secondary | ICD-10-CM

## 2023-09-07 LAB — SURGICAL PATHOLOGY

## 2023-09-07 NOTE — Progress Notes (Signed)
 ENT Progress Note   Discussed the use of AI scribe software for clinical note transcription with the patient, who gave verbal consent to proceed.  History of Present Illness Candice Parker is a 53 year old female who presents with concerns about drainage from the surgical site after neck surgery.  She is in the postoperative period following neck surgery, during which a drain was placed to manage fluid accumulation. She notes clear, non-bloody drainage from the surgical site, which has been minimal since yesterday. She denies tightness or discomfort in the area.  She experiences no significant pain and has not needed to take oxycodone , only using Tylenol  and ibuprofen  as needed. She is currently taking an antibiotic as prescribed to prevent infection.  She feels some throat congestion, which she attributes to intubation during the procedure.  Exam: Submental incision c/d/I Penrose drain in place, serous fluid when neck is massaged, kept in place No erythema or fluctuance  A/P S/p thyroglossal duct cyst excision   RTC Monday 8/4 for wound check and drain removal

## 2023-09-12 ENCOUNTER — Ambulatory Visit (INDEPENDENT_AMBULATORY_CARE_PROVIDER_SITE_OTHER): Admitting: Otolaryngology

## 2023-09-12 ENCOUNTER — Encounter (INDEPENDENT_AMBULATORY_CARE_PROVIDER_SITE_OTHER): Payer: Self-pay | Admitting: Otolaryngology

## 2023-09-12 VITALS — BP 137/97 | HR 101

## 2023-09-12 DIAGNOSIS — Q892 Congenital malformations of other endocrine glands: Secondary | ICD-10-CM

## 2023-09-12 DIAGNOSIS — Z9889 Other specified postprocedural states: Secondary | ICD-10-CM

## 2023-09-12 NOTE — Progress Notes (Signed)
 ENT Progress Note   Update 09/12/23 Candice Parker is a 53 year old female who presents with post-operative wound drainage and tenderness.  Here for drain removal. Denies significant pain.  She describes the drainage as less severe. She has been closely monitoring the site for any changes in drainage.  She experiences some pain, particularly when swallowing, which she describes as a sore throat.   Records reviewed History of Present Illness Candice Parker is a 53 year old female who presents with concerns about drainage from the surgical site after neck surgery.  She is in the postoperative period following neck surgery, during which a drain was placed to manage fluid accumulation. She notes clear, non-bloody drainage from the surgical site, which has been minimal since yesterday. She denies tightness or discomfort in the area.  She experiences no significant pain and has not needed to take oxycodone , only using Tylenol  and ibuprofen  as needed. She is currently taking an antibiotic as prescribed to prevent infection.  She feels some throat congestion, which she attributes to intubation during the procedure.     Exam: Submental incision c/d/I Penrose drain in place, no drainage when neck is palpated or with swallowing  No erythema or fluctuance  Drain removed today  A/P S/p thyroglossal duct cyst excision   RTC in 2 weeks for wound check

## 2023-09-19 ENCOUNTER — Encounter (INDEPENDENT_AMBULATORY_CARE_PROVIDER_SITE_OTHER): Admitting: Otolaryngology

## 2023-09-22 ENCOUNTER — Other Ambulatory Visit (HOSPITAL_COMMUNITY): Payer: Self-pay

## 2023-09-26 ENCOUNTER — Ambulatory Visit (INDEPENDENT_AMBULATORY_CARE_PROVIDER_SITE_OTHER): Admitting: Otolaryngology

## 2023-09-26 ENCOUNTER — Encounter (INDEPENDENT_AMBULATORY_CARE_PROVIDER_SITE_OTHER): Payer: Self-pay | Admitting: Otolaryngology

## 2023-09-26 VITALS — BP 145/98

## 2023-09-26 DIAGNOSIS — H9193 Unspecified hearing loss, bilateral: Secondary | ICD-10-CM

## 2023-09-26 DIAGNOSIS — H9192 Unspecified hearing loss, left ear: Secondary | ICD-10-CM | POA: Diagnosis not present

## 2023-09-26 DIAGNOSIS — Q892 Congenital malformations of other endocrine glands: Secondary | ICD-10-CM

## 2023-09-26 NOTE — Progress Notes (Signed)
 ENT Progress Note   Update 09/26/23  Candice Parker is a 53 year old female who presents with hearing difficulties worse on the left side.   She has been experiencing difficulty hearing soft-spoken peers during meetings, which has become more noticeable recently. This issue is impacting her professional interactions as she is reluctant to ask others to speak up.  Her history includes left ear hearing loss since childhood, which was surgically addressed over ten years ago. 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 Jesus in 2003. She recalls being informed of an 80-85% chance of hearing improvement post-surgery.  She does not recall having a hearing test after the surgery and denies experiencing any ringing in her ears.  She recovered well after TGDC removal and her incision is completely healed at this point.   Update 09/12/23 Candice Parker is a 53 year old female who presents with post-operative wound drainage and tenderness.  Here for drain removal. Denies significant pain.  She describes the drainage as less severe. She has been closely monitoring the site for any changes in drainage.  She experiences some pain, particularly when swallowing, which she describes as a sore throat.   Records reviewed History of Present Illness Candice Parker is a 53 year old female who presents with concerns about drainage from the surgical site after neck surgery.  She is in the postoperative period following neck surgery, during which a drain was placed to manage fluid accumulation. She notes clear, non-bloody drainage from the surgical site, which has been minimal since yesterday. She denies tightness or discomfort in the area.  She experiences no significant pain and has not needed to take oxycodone , only using Tylenol  and ibuprofen  as needed. She is currently taking an antibiotic as prescribed to prevent infection.  She feels some throat  congestion, which she attributes to intubation during the procedure.   BP (!) 145/98   LMP  (LMP Unknown)   Exam: General: Well-developed, well-nourished Respiratory Respiratory effort: Equal inspiration and expiration without stridor Cardiovascular Peripheral Vascular: Warm extremities with equal color/perfusion Eyes: No nystagmus with equal extraocular motion bilaterally Neuro/Psych/Balance: Patient oriented to person, place, and time; Appropriate mood and affect; Gait is intact with no imbalance; Cranial nerves I-XII are intact Head and Face Inspection: Normocephalic and atraumatic without mass or lesion Palpation: Facial skeleton intact without bony stepoffs Salivary Glands: No mass or tenderness  Facial Strength: Facial motility symmetric and full bilaterally ENT Pinna: External ear intact and fully developed External canal: Canal is patent with intact skin Tympanic Membrane: Clear and mobile External Nose: No scar or anatomic deformity Internal Nose: Septum is relatively straight on anterior rhinoscopy. Bilateral inferior turbinate hypertrophy.  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 No tinnitus reported. No ear drainage. Normal ear exam - Order hearing test to assess current hearing status. - Obtain past surgical records for review. - Consider temporal bone scan if indicated by hearing test results. - Schedule follow-up appointment on the same day as  the  hearing test.  S/p TGDC removal  - healed well no issues with recovery

## 2023-10-21 ENCOUNTER — Encounter: Payer: Self-pay | Admitting: Family Medicine

## 2023-10-27 ENCOUNTER — Other Ambulatory Visit: Payer: Self-pay | Admitting: Family Medicine

## 2023-10-27 ENCOUNTER — Other Ambulatory Visit (HOSPITAL_COMMUNITY): Payer: Self-pay

## 2023-10-27 ENCOUNTER — Other Ambulatory Visit: Payer: Self-pay

## 2023-11-09 ENCOUNTER — Other Ambulatory Visit (HOSPITAL_COMMUNITY): Payer: Self-pay

## 2023-11-09 ENCOUNTER — Encounter: Payer: Self-pay | Admitting: Internal Medicine

## 2023-11-10 ENCOUNTER — Other Ambulatory Visit (HOSPITAL_BASED_OUTPATIENT_CLINIC_OR_DEPARTMENT_OTHER): Payer: Self-pay

## 2023-11-10 MED ORDER — FLUZONE 0.5 ML IM SUSY
0.5000 mL | PREFILLED_SYRINGE | Freq: Once | INTRAMUSCULAR | 0 refills | Status: AC
  Filled 2023-11-10: qty 0.5, 1d supply, fill #0

## 2023-11-17 ENCOUNTER — Ambulatory Visit (INDEPENDENT_AMBULATORY_CARE_PROVIDER_SITE_OTHER): Admitting: Otolaryngology

## 2023-11-17 ENCOUNTER — Ambulatory Visit (INDEPENDENT_AMBULATORY_CARE_PROVIDER_SITE_OTHER): Admitting: Audiology

## 2023-11-22 ENCOUNTER — Other Ambulatory Visit (HOSPITAL_COMMUNITY): Payer: Self-pay

## 2023-11-30 ENCOUNTER — Telehealth (INDEPENDENT_AMBULATORY_CARE_PROVIDER_SITE_OTHER): Payer: Self-pay

## 2023-11-30 NOTE — Telephone Encounter (Signed)
 Left vm to r/s 12/19/23 appointment

## 2023-12-05 ENCOUNTER — Encounter (INDEPENDENT_AMBULATORY_CARE_PROVIDER_SITE_OTHER): Payer: Self-pay

## 2023-12-07 ENCOUNTER — Other Ambulatory Visit (INDEPENDENT_AMBULATORY_CARE_PROVIDER_SITE_OTHER): Payer: Self-pay

## 2023-12-07 DIAGNOSIS — H905 Unspecified sensorineural hearing loss: Secondary | ICD-10-CM

## 2023-12-08 ENCOUNTER — Other Ambulatory Visit: Payer: Self-pay

## 2023-12-08 ENCOUNTER — Other Ambulatory Visit: Payer: Self-pay | Admitting: Family Medicine

## 2023-12-08 ENCOUNTER — Other Ambulatory Visit (INDEPENDENT_AMBULATORY_CARE_PROVIDER_SITE_OTHER): Payer: Self-pay

## 2023-12-08 ENCOUNTER — Other Ambulatory Visit (HOSPITAL_COMMUNITY): Payer: Self-pay

## 2023-12-08 ENCOUNTER — Encounter (INDEPENDENT_AMBULATORY_CARE_PROVIDER_SITE_OTHER): Payer: Self-pay

## 2023-12-08 DIAGNOSIS — H9190 Unspecified hearing loss, unspecified ear: Secondary | ICD-10-CM

## 2023-12-08 NOTE — Telephone Encounter (Signed)
Okay to fill now.

## 2023-12-10 ENCOUNTER — Other Ambulatory Visit (HOSPITAL_COMMUNITY): Payer: Self-pay

## 2023-12-10 MED ORDER — AMPHETAMINE-DEXTROAMPHET ER 20 MG PO CP24
20.0000 mg | ORAL_CAPSULE | Freq: Every day | ORAL | 0 refills | Status: DC
  Filled 2023-12-10: qty 30, 30d supply, fill #0

## 2023-12-10 MED ORDER — AMPHETAMINE-DEXTROAMPHET ER 20 MG PO CP24
20.0000 mg | ORAL_CAPSULE | Freq: Every day | ORAL | 0 refills | Status: DC
  Filled 2023-12-10 – 2023-12-11 (×2): qty 30, 30d supply, fill #0

## 2023-12-11 ENCOUNTER — Other Ambulatory Visit (HOSPITAL_COMMUNITY): Payer: Self-pay

## 2023-12-12 ENCOUNTER — Other Ambulatory Visit: Payer: Self-pay

## 2023-12-19 ENCOUNTER — Ambulatory Visit (INDEPENDENT_AMBULATORY_CARE_PROVIDER_SITE_OTHER): Admitting: Audiology

## 2023-12-19 ENCOUNTER — Ambulatory Visit (INDEPENDENT_AMBULATORY_CARE_PROVIDER_SITE_OTHER): Admitting: Otolaryngology

## 2023-12-19 ENCOUNTER — Ambulatory Visit (INDEPENDENT_AMBULATORY_CARE_PROVIDER_SITE_OTHER)

## 2023-12-24 ENCOUNTER — Other Ambulatory Visit (HOSPITAL_COMMUNITY): Payer: Self-pay

## 2023-12-26 ENCOUNTER — Other Ambulatory Visit (HOSPITAL_COMMUNITY): Payer: Self-pay

## 2023-12-29 ENCOUNTER — Encounter (INDEPENDENT_AMBULATORY_CARE_PROVIDER_SITE_OTHER): Payer: Self-pay

## 2023-12-30 ENCOUNTER — Encounter: Payer: Self-pay | Admitting: Internal Medicine

## 2023-12-30 ENCOUNTER — Other Ambulatory Visit (HOSPITAL_COMMUNITY): Payer: Self-pay

## 2023-12-30 ENCOUNTER — Other Ambulatory Visit: Payer: Self-pay

## 2023-12-30 ENCOUNTER — Ambulatory Visit

## 2023-12-30 VITALS — Ht 68.5 in | Wt 250.0 lb

## 2023-12-30 DIAGNOSIS — Z860100 Personal history of colon polyps, unspecified: Secondary | ICD-10-CM

## 2023-12-30 MED ORDER — NA SULFATE-K SULFATE-MG SULF 17.5-3.13-1.6 GM/177ML PO SOLN
1.0000 | Freq: Once | ORAL | 0 refills | Status: AC
  Filled 2023-12-30: qty 354, 1d supply, fill #0

## 2023-12-30 NOTE — Progress Notes (Signed)
 No issues known to pt with past sedation with any surgeries or procedures Patient denies ever being told they had issues or difficulty with intubation  No FH of Malignant Hyperthermia Pt is not on diet pills nor GLP-1 medication Pt is not on home 02  Pt is not on blood thinners  Pt denies issues with chronic constipation  No A fib or A flutter Have any cardiac testing pending--no Pt instructed to use [email or web address] or GoodRx for a price reduction on prep  Ambulates independently

## 2024-01-02 ENCOUNTER — Other Ambulatory Visit (HOSPITAL_COMMUNITY): Payer: Self-pay

## 2024-01-02 DIAGNOSIS — L219 Seborrheic dermatitis, unspecified: Secondary | ICD-10-CM | POA: Diagnosis not present

## 2024-01-02 DIAGNOSIS — L6681 Central centrifugal cicatricial alopecia: Secondary | ICD-10-CM | POA: Diagnosis not present

## 2024-01-02 DIAGNOSIS — L658 Other specified nonscarring hair loss: Secondary | ICD-10-CM | POA: Diagnosis not present

## 2024-01-02 MED ORDER — FINASTERIDE 5 MG PO TABS
5.0000 mg | ORAL_TABLET | Freq: Every day | ORAL | 3 refills | Status: AC
  Filled 2024-02-12: qty 90, 90d supply, fill #0

## 2024-01-02 MED ORDER — MINOXIDIL 2.5 MG PO TABS
1.2500 mg | ORAL_TABLET | Freq: Every day | ORAL | 3 refills | Status: DC
  Filled 2024-02-12 – 2024-02-24 (×3): qty 90, 180d supply, fill #0

## 2024-01-09 ENCOUNTER — Encounter: Payer: Self-pay | Admitting: Internal Medicine

## 2024-01-13 ENCOUNTER — Encounter: Admitting: Internal Medicine

## 2024-01-23 ENCOUNTER — Other Ambulatory Visit: Payer: Self-pay

## 2024-01-23 ENCOUNTER — Other Ambulatory Visit (HOSPITAL_COMMUNITY): Payer: Self-pay

## 2024-01-23 ENCOUNTER — Other Ambulatory Visit: Payer: Self-pay | Admitting: Family Medicine

## 2024-01-23 MED ORDER — BETAMETHASONE DIPROPIONATE AUG 0.05 % EX LOTN
TOPICAL_LOTION | CUTANEOUS | 6 refills | Status: AC
  Filled 2024-01-23: qty 60, 30d supply, fill #0

## 2024-01-23 MED ORDER — AMPHETAMINE-DEXTROAMPHET ER 20 MG PO CP24
20.0000 mg | ORAL_CAPSULE | Freq: Every day | ORAL | 0 refills | Status: AC
  Filled 2024-01-23: qty 30, 30d supply, fill #0

## 2024-01-23 MED ORDER — AMPHETAMINE-DEXTROAMPHET ER 20 MG PO CP24
20.0000 mg | ORAL_CAPSULE | Freq: Every day | ORAL | 0 refills | Status: AC
  Filled 2024-01-23 – 2024-03-08 (×3): qty 30, 30d supply, fill #0

## 2024-01-23 MED ORDER — AMLODIPINE BESYLATE 2.5 MG PO TABS
2.5000 mg | ORAL_TABLET | Freq: Every day | ORAL | 5 refills | Status: AC
  Filled 2024-01-23: qty 30, 30d supply, fill #0
  Filled 2024-03-08: qty 90, 90d supply, fill #1

## 2024-01-24 ENCOUNTER — Other Ambulatory Visit (HOSPITAL_COMMUNITY): Payer: Self-pay

## 2024-02-13 ENCOUNTER — Other Ambulatory Visit (HOSPITAL_COMMUNITY): Payer: Self-pay

## 2024-02-13 ENCOUNTER — Other Ambulatory Visit: Payer: Self-pay

## 2024-02-15 ENCOUNTER — Other Ambulatory Visit (HOSPITAL_COMMUNITY): Payer: Self-pay

## 2024-02-15 ENCOUNTER — Other Ambulatory Visit: Payer: Self-pay

## 2024-02-15 ENCOUNTER — Encounter (HOSPITAL_COMMUNITY): Payer: Self-pay | Admitting: Pharmacist

## 2024-02-16 ENCOUNTER — Other Ambulatory Visit (HOSPITAL_COMMUNITY): Payer: Self-pay

## 2024-02-20 ENCOUNTER — Ambulatory Visit: Admitting: Internal Medicine

## 2024-02-20 ENCOUNTER — Other Ambulatory Visit (HOSPITAL_COMMUNITY): Payer: Self-pay

## 2024-02-20 ENCOUNTER — Encounter: Payer: Self-pay | Admitting: Internal Medicine

## 2024-02-20 VITALS — BP 118/67 | HR 65 | Temp 97.6°F | Resp 14 | Ht 68.5 in | Wt 250.0 lb

## 2024-02-20 DIAGNOSIS — Z860101 Personal history of adenomatous and serrated colon polyps: Secondary | ICD-10-CM

## 2024-02-20 DIAGNOSIS — D125 Benign neoplasm of sigmoid colon: Secondary | ICD-10-CM

## 2024-02-20 DIAGNOSIS — K635 Polyp of colon: Secondary | ICD-10-CM

## 2024-02-20 DIAGNOSIS — Z1211 Encounter for screening for malignant neoplasm of colon: Secondary | ICD-10-CM

## 2024-02-20 DIAGNOSIS — K648 Other hemorrhoids: Secondary | ICD-10-CM

## 2024-02-20 DIAGNOSIS — K5730 Diverticulosis of large intestine without perforation or abscess without bleeding: Secondary | ICD-10-CM

## 2024-02-20 DIAGNOSIS — Z860100 Personal history of colon polyps, unspecified: Secondary | ICD-10-CM

## 2024-02-20 DIAGNOSIS — D122 Benign neoplasm of ascending colon: Secondary | ICD-10-CM

## 2024-02-20 DIAGNOSIS — K644 Residual hemorrhoidal skin tags: Secondary | ICD-10-CM | POA: Diagnosis not present

## 2024-02-20 MED ORDER — SODIUM CHLORIDE 0.9 % IV SOLN: 500.0000 mL | INTRAVENOUS | Status: DC

## 2024-02-20 NOTE — Progress Notes (Signed)
 Pt's states no medical or surgical changes since previsit or office visit.

## 2024-02-20 NOTE — Progress Notes (Signed)
 Called to room to assist during endoscopic procedure.  Patient ID and intended procedure confirmed with present staff. Received instructions for my participation in the procedure from the performing physician.

## 2024-02-20 NOTE — Progress Notes (Signed)
 Transferred to PACU via stretcher.  Not responding to stimulation at this time.  VSS upon leaving procedure room.

## 2024-02-20 NOTE — Progress Notes (Signed)
 "   GASTROENTEROLOGY PROCEDURE H&P NOTE   Primary Care Physician: Katrinka Garnette KIDD, MD    Reason for Procedure:  History of SSP's  Plan:    Colonoscopy  Patient is appropriate for endoscopic procedure(s) in the ambulatory ([facility]) setting.  The nature of the procedure, as well as the risks, benefits, and alternatives were carefully and thoroughly reviewed with the patient. Ample time for discussion and questions allowed.  All questions were answered. The patient understood, was satisfied, and agreed with the plan to proceed.    HPI: Candice Parker is a 54 y.o. female who presents for surveillance colonoscopy.  Medical history as below.  Tolerated the prep.  No recent chest pain or shortness of breath.  No abdominal pain today.  Past Medical History:  Diagnosis Date   Acid reflux 12/10/2010   hx of-diet controlled   Allergy    sesaonal allergies   ASTHMA, INTRINSIC 01/29/2009   uses inhaler PRN   Breast lesion 09/29/2011   Hypertension    on meds   Overweight(278.02) 12/10/2010    Past Surgical History:  Procedure Laterality Date   ELBOW FRACTURE SURGERY Left    FRACTURE SURGERY     Elbow   INNER EAR SURGERY Left    THYROGLOSSAL DUCT CYST N/A 09/05/2023   Procedure: EXCISION THYROGLOSSAL DUCT CYST;  Surgeon: Okey Burns, MD;  Location: [facility];  Service: ENT;  Laterality: N/A;   TOTAL ABDOMINAL HYSTERECTOMY     fibroid uterus.    Prior to Admission medications  Medication Sig Start Date End Date Taking? Authorizing Provider  amLODipine  (NORVASC ) 2.5 MG tablet Take 1 tablet (2.5 mg total) by mouth daily. 01/23/24  Yes Katrinka Garnette KIDD, MD  betamethasone , augmented, (DIPROLENE ) 0.05 % lotion Apply to scalp 3-4 times per week. Not to face. 01/23/24  Yes   acetaminophen  (TYLENOL ) 500 MG tablet Take 1 tablet (500 mg total) by mouth every 6 (six) hours. Take every 6 hrs x 2-3 days then take every 6 hrs as needed Patient taking differently: Take 500 mg  by mouth every 8 (eight) hours as needed. Take every 6 hrs x 2-3 days then take every 6 hrs as needed 09/05/23   Soldatova, Liuba, MD  acyclovir  (ZOVIRAX ) 400 MG tablet Take 1 tablet (400 mg total) by mouth 5 (five) times daily for 5 days- take at first sign of cold sore. Patient not taking: Reported on 02/20/2024 05/04/21   Katrinka Garnette KIDD, MD  acyclovir  ointment (ZOVIRAX ) 5 % Apply 1 application topically every 3 (three) hours. Patient not taking: Reported on 02/20/2024 06/20/19   Katrinka Garnette KIDD, MD  albuterol  (VENTOLIN  HFA) 108 [phone] Base) MCG/ACT inhaler Inhale 1-2 puffs into the lungs every 6 (six) hours as needed for wheezing or shortness of breath. Patient not taking: Reported on 02/20/2024 05/26/22   Katrinka Garnette KIDD, MD  amphetamine -dextroamphetamine  (ADDERALL  XR) 20 MG 24 hr capsule Take 1 capsule (20 mg total) by mouth daily. 01/23/24   Katrinka Garnette KIDD, MD  amphetamine -dextroamphetamine  (ADDERALL  XR) 20 MG 24 hr capsule Take 1 capsule (20 mg total) by mouth daily. 01/23/24   Katrinka Garnette KIDD, MD  cetirizine  (ZYRTEC ) 10 MG tablet Take 1 tablet (10 mg total) by mouth daily. Patient not taking: Reported on 09/02/2023 05/26/23   Soldatova, Liuba, MD  finasteride  (PROSCAR ) 5 MG tablet Take 1 tablet (5 mg total) by mouth daily. 01/02/24     fluticasone  (FLONASE ) 50 MCG/ACT nasal spray Place 2 sprays into both  nostrils daily. Patient not taking: Reported on 09/02/2023 05/26/23   Soldatova, Liuba, MD  hydrochlorothiazide  (MICROZIDE ) 12.5 MG capsule Take 1 capsule (12.5 mg total) by mouth daily. 06/13/23   Katrinka Garnette KIDD, MD  ibuprofen  (ADVIL ) 600 MG tablet Take 1 tablet (600 mg total) by mouth every 6 (six) hours. Please take every 6 hrs, and stagger this medication 3 hrs apart from Tylenol  09/05/23   Soldatova, Liuba, MD  LORATADINE PO Take 1 tablet by mouth as needed.    [provider]  minoxidil  (LONITEN ) 2.5 MG tablet Take 0.5 tablets (1.25 mg total) by mouth daily. 01/02/24      Multiple Vitamin (MULTIVITAMIN) tablet Take 1 tablet by mouth daily. Women's One-A-Day    [provider]  potassium chloride  SA (KLOR-CON  M) 20 MEQ tablet Take 1 tablet (20 mEq total) by mouth once a week. Take one tablet by mouth daily for 4 days then hold the remaining 6 pills. Patient not taking: No sig reported 09/02/23   Katrinka Garnette KIDD, MD    Current Outpatient Medications  Medication Sig Dispense Refill   amLODipine  (NORVASC ) 2.5 MG tablet Take 1 tablet (2.5 mg total) by mouth daily. 30 tablet 5   betamethasone , augmented, (DIPROLENE ) 0.05 % lotion Apply to scalp 3-4 times per week. Not to face. 60 mL 6   acetaminophen  (TYLENOL ) 500 MG tablet Take 1 tablet (500 mg total) by mouth every 6 (six) hours. Take every 6 hrs x 2-3 days then take every 6 hrs as needed (Patient taking differently: Take 500 mg by mouth every 8 (eight) hours as needed. Take every 6 hrs x 2-3 days then take every 6 hrs as needed) 30 tablet 0   acyclovir  (ZOVIRAX ) 400 MG tablet Take 1 tablet (400 mg total) by mouth 5 (five) times daily for 5 days- take at first sign of cold sore. (Patient not taking: Reported on 02/20/2024) 30 tablet 2   acyclovir  ointment (ZOVIRAX ) 5 % Apply 1 application topically every 3 (three) hours. (Patient not taking: Reported on 02/20/2024) 30 g 1   albuterol  (VENTOLIN  HFA) 108 (90 Base) MCG/ACT inhaler Inhale 1-2 puffs into the lungs every 6 (six) hours as needed for wheezing or shortness of breath. (Patient not taking: Reported on 02/20/2024) 6.7 g 5   amphetamine -dextroamphetamine  (ADDERALL  XR) 20 MG 24 hr capsule Take 1 capsule (20 mg total) by mouth daily. 30 capsule 0   amphetamine -dextroamphetamine  (ADDERALL  XR) 20 MG 24 hr capsule Take 1 capsule (20 mg total) by mouth daily. 30 capsule 0   cetirizine  (ZYRTEC ) 10 MG tablet Take 1 tablet (10 mg total) by mouth daily. (Patient not taking: Reported on 09/02/2023) 30 tablet 11   finasteride  (PROSCAR ) 5 MG tablet Take 1 tablet (5 mg  total) by mouth daily. 90 tablet 3   fluticasone  (FLONASE ) 50 MCG/ACT nasal spray Place 2 sprays into both nostrils daily. (Patient not taking: Reported on 09/02/2023) 16 g 6   hydrochlorothiazide  (MICROZIDE ) 12.5 MG capsule Take 1 capsule (12.5 mg total) by mouth daily. 90 capsule 3   ibuprofen  (ADVIL ) 600 MG tablet Take 1 tablet (600 mg total) by mouth every 6 (six) hours. Please take every 6 hrs, and stagger this medication 3 hrs apart from Tylenol  30 tablet 0   LORATADINE PO Take 1 tablet by mouth as needed.     minoxidil  (LONITEN ) 2.5 MG tablet Take 0.5 tablets (1.25 mg total) by mouth daily. 90 tablet 3   Multiple Vitamin (MULTIVITAMIN) tablet Take 1 tablet  by mouth daily. Women's One-A-Day     potassium chloride  SA (KLOR-CON  M) 20 MEQ tablet Take 1 tablet (20 mEq total) by mouth once a week. Take one tablet by mouth daily for 4 days then hold the remaining 6 pills. (Patient not taking: No sig reported) 13 tablet 3   Current Facility-Administered Medications  Medication Dose Route Frequency Provider Last Rate Last Admin   0.9 %  sodium chloride  infusion  500 mL Intravenous Continuous Synda Bagent, Gordy HERO, MD        Allergies as of 02/20/2024 - Review Complete 02/20/2024  Allergen Reaction Noted   Lisinopril  Other (See Comments) 05/26/2022    Family History  Problem Relation Age of Onset   Breast cancer Mother 69   Hypertension Mother    Hyperlipidemia Mother    Endometrial cancer Mother        stage I detected in 2023- did well with surgery   Cancer Mother    Obesity Mother    Heart failure Father        drug induced   Kidney disease Father        failure   Drug abuse Father    Breast cancer Maternal Grandmother    Cancer Maternal Grandmother    Breast cancer Paternal Grandmother    Alcohol abuse Paternal Grandmother    Cancer Paternal Grandmother    Hypertension Brother    Asthma Son    Cancer Paternal Aunt    Colon polyps Neg Hx    Colon cancer Neg Hx    Esophageal cancer  Neg Hx    Rectal cancer Neg Hx    Stomach cancer Neg Hx     Social History   Socioeconomic History   Marital status: Single    Spouse name: Not on file   Number of children: 1   Years of education: Not on file   Highest education level: Bachelor's degree (e.g., BA, AB, BS)  Occupational History    Employer: [employer]    Comment: Producer, television/film/video  Tobacco Use   Smoking status: Never   Smokeless tobacco: Never  Vaping Use   Vaping status: Never Used  Substance and Sexual Activity   Alcohol use: Yes    Alcohol/week: 3.0 standard drinks of alcohol    Types: 3 Standard drinks or equivalent per week    Comment: Occasional   Drug use: Never   Sexual activity: Yes    Birth control/protection: Abstinence, Condom  Other Topics Concern   Not on file  Social History Narrative   Single but long term boyfriend. 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      Hobbies: enjoys going out to eat, travel    Social Drivers of Health   Tobacco Use: Low Risk (02/20/2024)   Patient History    Smoking Tobacco Use: Never    Smokeless Tobacco Use: Never    Passive Exposure: Not on file  Financial Resource Strain: Low Risk (09/02/2023)   Overall Financial Resource Strain (CARDIA)    Difficulty of Paying Living Expenses: Not very hard  Food Insecurity: No Food Insecurity (09/02/2023)   Epic    Worried About Programme Researcher, Broadcasting/film/video in the Last Year: Never true    Ran Out of Food in the Last Year: Never true  Transportation Needs: No Transportation  Needs (09/02/2023)   Epic    Lack of Transportation (Medical): No    Lack of Transportation (Non-Medical): No  Physical Activity: Inactive (09/02/2023)   Exercise Vital Sign    Days of Exercise per Week: 0 days    Minutes of Exercise per Session: Not on file  Stress: No Stress Concern Present  (09/02/2023)   [employer] of Occupational Health - Occupational Stress Questionnaire    Feeling of Stress: Only a little  Social Connections: Moderately Isolated (09/02/2023)   Social Connection and Isolation Panel    Frequency of Communication with Friends and Family: More than three times a week    Frequency of Social Gatherings with Friends and Family: Once a week    Attends Religious Services: 1 to 4 times per year    Active Member of [employer] or Organizations: No    Attends Engineer, Structural: Not on file    Marital Status: Never married  Intimate Partner Violence: Not on file  Depression (PHQ2-9): Low Risk (09/02/2023)   Depression (PHQ2-9)    PHQ-2 Score: 0  Alcohol Screen: Low Risk (09/02/2023)   Alcohol Screen    Last Alcohol Screening Score (AUDIT): 2  Housing: Low Risk (09/02/2023)   Epic    Unable to Pay for Housing in the Last Year: No    Number of Times Moved in the Last Year: 0    Homeless in the Last Year: No  Utilities: Not on file  Health Literacy: Not on file    Physical Exam: Vital signs in last 24 hours: @BP  (!) 128/94   Pulse 88   Temp 97.6 F (36.4 C) (Temporal)   Ht 5' 8.5 (1.74 m)   Wt 250 lb (113.4 kg)   LMP  (LMP Unknown)   SpO2 98%   BMI 37.46 kg/m  GEN: NAD EYE: Sclerae anicteric ENT: MMM CV: Non-tachycardic Pulm: CTA b/l GI: Soft, NT/ND NEURO:  Alert & Oriented x 3   Gordy Starch, MD [facility]  02/20/2024 10:06 AM  "

## 2024-02-20 NOTE — Op Note (Signed)
 [facility] Patient Name: Candice Parker Procedure Date: 02/20/2024 10:10 AM MRN: [id number] Endoscopist: Gordy CHRISTELLA Starch , MD, [id number] Age: 54 Referring MD:  Date of Birth: July 22, 1970 Gender: Female Account #: [id number] Procedure:                Colonoscopy Indications:              High risk colon cancer surveillance: Personal                            history of sessile serrated colon polyp (less than                            10 mm in size) with no dysplasia, Last colonoscopy:                            March 2022 (index exam, SSP x 4) Medicines:                Monitored Anesthesia Care Procedure:                Pre-Anesthesia Assessment:                           - Prior to the procedure, a History and Physical                            was performed, and patient medications and                            allergies were reviewed. The patient's tolerance of                            previous anesthesia was also reviewed. The risks                            and benefits of the procedure and the sedation                            options and risks were discussed with the patient.                            All questions were answered, and informed consent                            was obtained. Prior Anticoagulants: The patient has                            taken no anticoagulant or antiplatelet agents. ASA                            Grade Assessment: II - A patient with mild systemic                            disease. After reviewing the risks and benefits,  the patient was deemed in satisfactory condition to                            undergo the procedure.                           After obtaining informed consent, the colonoscope                            was passed under direct vision. Throughout the                            procedure, the patient's blood pressure, pulse, and                            oxygen saturations were  monitored continuously. The                            CF [id number] #[id number] was introduced through the anus                            and advanced to the cecum, identified by                            appendiceal orifice and ileocecal valve. The                            colonoscopy was performed without difficulty. The                            patient tolerated the procedure well. The quality                            of the bowel preparation was good. The ileocecal                            valve, appendiceal orifice, and rectum were                            photographed. Scope In: 10:16:10 AM Scope Out: 10:30:14 AM Scope Withdrawal Time: 0 hours 11 minutes 37 seconds  Total Procedure Duration: 0 hours 14 minutes 4 seconds  Findings:                 The digital rectal exam was normal.                           A 6 mm polyp was found in the ascending colon. The                            polyp was sessile. The polyp was removed with a                            cold snare. Resection and retrieval were complete.  Two sessile polyps were found in the sigmoid colon.                            The polyps were 4 to 5 mm in size. These polyps                            were removed with a cold snare. Resection and                            retrieval were complete.                           Multiple medium-mouthed and small-mouthed                            diverticula were found in the sigmoid colon,                            descending colon and hepatic flexure.                           External and internal hemorrhoids were found during                            retroflexion. The hemorrhoids were medium-sized. Complications:            No immediate complications. Estimated Blood Loss:     Estimated blood loss was minimal. Impression:               - One 6 mm polyp in the ascending colon, removed                            with a cold snare. Resected and  retrieved.                           - Two 4 to 5 mm polyps in the sigmoid colon,                            removed with a cold snare. Resected and retrieved.                           - Mild diverticulosis in the sigmoid colon, in the                            descending colon and at the hepatic flexure.                           - External and internal hemorrhoids. Recommendation:           - Patient has a contact number available for                            emergencies. The signs and symptoms of potential  delayed complications were discussed with the                            patient. Return to normal activities tomorrow.                            Written discharge instructions were provided to the                            patient.                           - Resume previous diet.                           - Continue present medications.                           - Await pathology results.                           - Repeat colonoscopy is recommended for                            surveillance. The colonoscopy date will be                            determined after pathology results from today's                            exam become available for review. Gordy CHRISTELLA Starch, MD 02/20/2024 10:32:43 AM This report has been signed electronically.

## 2024-02-20 NOTE — Patient Instructions (Signed)

## 2024-02-21 ENCOUNTER — Telehealth: Payer: Self-pay

## 2024-02-21 NOTE — Telephone Encounter (Signed)
"   °  Follow up Call-     02/20/2024    9:09 AM  Call back number  Post procedure Call Back phone  # [phone]  Permission to leave phone message Yes     Patient questions:  Do you have a fever, pain , or abdominal swelling? No. Pain Score  0 *  Have you tolerated food without any problems? Yes.    Have you been able to return to your normal activities? Yes.    Do you have any questions about your discharge instructions: Diet   No. Medications  No. Follow up visit  No.  Do you have questions or concerns about your Care? No.  Actions: * If pain score is 4 or above: No action needed, pain <4.   "

## 2024-02-22 ENCOUNTER — Ambulatory Visit: Payer: Self-pay | Admitting: Internal Medicine

## 2024-02-22 LAB — SURGICAL PATHOLOGY

## 2024-02-24 ENCOUNTER — Other Ambulatory Visit (HOSPITAL_COMMUNITY): Payer: Self-pay

## 2024-02-24 ENCOUNTER — Other Ambulatory Visit: Payer: Self-pay

## 2024-02-24 MED ORDER — MINOXIDIL 2.5 MG PO TABS
2.5000 mg | ORAL_TABLET | Freq: Every day | ORAL | 3 refills | Status: AC
  Filled 2024-02-24 (×2): qty 90, 90d supply, fill #0

## 2024-03-08 ENCOUNTER — Other Ambulatory Visit: Payer: Self-pay

## 2024-03-12 ENCOUNTER — Ambulatory Visit: Admitting: Family Medicine

## 2024-03-16 ENCOUNTER — Telehealth: Payer: Self-pay

## 2024-03-16 ENCOUNTER — Encounter: Payer: Self-pay | Admitting: Family Medicine

## 2024-03-16 ENCOUNTER — Ambulatory Visit: Admitting: Family Medicine

## 2024-03-16 VITALS — BP 138/88 | HR 78 | Temp 97.5°F | Ht 68.5 in | Wt 247.2 lb

## 2024-03-16 DIAGNOSIS — I10 Essential (primary) hypertension: Secondary | ICD-10-CM

## 2024-03-16 DIAGNOSIS — F988 Other specified behavioral and emotional disorders with onset usually occurring in childhood and adolescence: Secondary | ICD-10-CM

## 2024-03-16 NOTE — Progress Notes (Addendum)
 " Phone [phone] In person visit   Subjective:   Candice Parker is a 54 y.o. year old very pleasant female patient who presents for/with See problem oriented charting Chief Complaint  Patient presents with   Follow-up    6 month follow-up with pcp. Has not fasted for labs. No questions or concerns for appointment today.   Missed placed BP meds but restarted back on Tuesday.     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    Priority: Medium    Left knee pain 06/13/2014    Priority: Medium    HTN (hypertension) 07/17/2010    Priority: Medium    Stress headache 07/18/2017    Priority: Low   Left elbow pain 06/13/2014    Priority: Low   Herpes simplex 06/13/2014    Priority: Low   Chest pain, atypical 04/11/2011    Priority: Low   Intermittent memory loss 02/18/2011    Priority: Low   Obesity 12/10/2010    Priority: Low   Asthma 01/29/2009    Priority: Low   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 Take every 6 hrs x 2-3 days then take every 6 hrs as needed 30 tablet 0   amLODipine  (NORVASC ) 2.5 MG tablet Take 1 tablet (2.5 mg total) by mouth daily. 30 tablet 5   amphetamine -dextroamphetamine  (ADDERALL  XR) 20 MG 24 hr capsule Take 1 capsule (20 mg total) by mouth daily. 30 capsule 0   amphetamine -dextroamphetamine  (ADDERALL  XR) 20 MG 24 hr capsule Take 1 capsule (20 mg total) by mouth daily. 30 capsule 0   betamethasone , augmented, (DIPROLENE ) 0.05 % lotion Apply to scalp 3-4 times per week. Not to face. 60 mL 6   finasteride  (PROSCAR ) 5 MG tablet Take 1 tablet (5 mg total) by mouth  daily. 90 tablet 3   hydrochlorothiazide  (MICROZIDE ) 12.5 MG capsule Take 1 capsule (12.5 mg total) by mouth daily. 90 capsule 3   ibuprofen  (ADVIL ) 600 MG tablet Take 1 tablet (600 mg total) by mouth every 6 (six) hours. Please take every 6 hrs, and stagger this medication 3 hrs apart from Tylenol  30 tablet 0   LORATADINE PO Take 1 tablet by mouth as needed. (Patient taking differently: Take 1 tablet by mouth daily.)     minoxidil  (LONITEN ) 2.5 MG tablet Take 1 tablet (2.5 mg total) by mouth daily. 90 tablet 3   Multiple Vitamin (MULTIVITAMIN) tablet Take 1 tablet by mouth daily. Women's One-A-Day     No current facility-administered medications for this visit.     Objective:  BP 138/88   Pulse 78   Temp (!) 97.5 F (36.4 C) (Temporal)   Ht 5' 8.5 (1.74 m)   Wt 247 lb 3.2 oz (112.1 kg)   LMP  (LMP Unknown)   SpO2 98%   BMI 37.04 kg/m  Gen: NAD, resting comfortably CV: RRR no murmurs rubs or gallops Lungs: CTAB no crackles, wheeze, rhonchi Ext: no edema Skin: warm, dry     Assessment and Plan   # Attention Deficit Disorder in Adult S:compliant with Adderall  XR 20 mg daily.  Controlling symptoms  reasonably  well - mainly takes during the week- skips weekend still - controlled substance contract 07/18/17  -UDS 09/02/23 - original diagnosis by Dr. Coni of [facility] -no chest pain or shortness of breath. Had brief  palpitations druing a time of stress- no recurrence A/P:  attention deficit disorder reasonably controlled- continue current medications . Not quite due for refills but main thing on refills is trying to line them all up together- she's going to work with pharmacy and I am willing to send in the Adderall  closer to this timing- we are happy to try 90 day supply with her stability if insurance is willing  #hypertension S: compliant with Hydrochlorothiazide  12.5 mg, amlodipine  2.5 mg. Off blood pressure medications a few days misplaced- but got back on  them on tuesday -Likely angioedema on lisinopril  -no recent home checks but has machine BP Readings from Last 3 Encounters:  03/16/24 138/88  02/20/24 118/67  09/26/23 (!) 145/98  A/P: high acceptable reading- did just eat lunch and salt content could be driving higher- she agrees to monitor at home and if staying over 135/85 she Candice let me know and we can adjust -down 2 lbs last visit  # Asthma S:Using Albuterol  HFA prn.  No recent issues  A/P: no recent issues and not needing inhalers  # Herpes Simplex S:Using  Acyclovir  or Acyclovir -Hydrocortisone  cream prn for cold sores.  A/P: doing well- no recent issues   #hair loss- on finasteride  through [facility] as well as minoxidil  orally   #thyroglossal duct cyst planned excision 09/05/23- benign but had removal  - they were evaluating hearing but not a major issue right now and wants to hold off after prior MD left practice  #History adenomatous polyp January 2026 with 5 year repeat   #prediabetes up to 5.8 a1c in 2025 - wants to work on lifestyle then recheck  Recommended follow up: Return for next already scheduled visit or sooner if needed. Future Appointments  Date Time Provider Department Center  09/04/2024  8:20 AM Candice Garnette KIDD, MD [facility]    Lab/Order associations:   ICD-10-CM   1. Attention deficit disorder (ADD) in adult  F98.8 AMB Referral [facility]    2. Primary hypertension  I10 AMB Referral [facility]      No orders of the defined types were placed in this encounter.   Return precautions advised.  Garnette Katrinka, MD  "

## 2024-03-16 NOTE — Patient Instructions (Addendum)
"   high acceptable reading- did just eat lunch and salt content could be driving higher- she agrees to monitor at home and if staying over 135/85 she will let me know and we can adjust  Recommended follow up: Return for next already scheduled visit or sooner if needed. "

## 2024-09-04 ENCOUNTER — Encounter: Admitting: Family Medicine
